# Patient Record
Sex: Male | Born: 1960 | Race: White | Marital: Married | Smoking: Never smoker
Health system: Northeastern US, Academic
[De-identification: ages and names within clinical notes are randomized; demographics above are authoritative.]

## PROBLEM LIST (undated history)

## (undated) DIAGNOSIS — E119 Type 2 diabetes mellitus without complications: Secondary | ICD-10-CM

## (undated) DIAGNOSIS — G709 Myoneural disorder, unspecified: Secondary | ICD-10-CM

## (undated) DIAGNOSIS — M1990 Unspecified osteoarthritis, unspecified site: Secondary | ICD-10-CM

## (undated) DIAGNOSIS — M170 Bilateral primary osteoarthritis of knee: Secondary | ICD-10-CM

## (undated) DIAGNOSIS — R079 Chest pain, unspecified: Secondary | ICD-10-CM

## (undated) DIAGNOSIS — F419 Anxiety disorder, unspecified: Secondary | ICD-10-CM

## (undated) HISTORY — PX: KNEE ARTHROSCOPY - LIGAMENT RECONSTRUCTION: SHX127B

## (undated) HISTORY — DX: Anxiety disorder, unspecified: F41.9

## (undated) HISTORY — PX: OTHER SURGICAL HISTORY: SHX169

## (undated) HISTORY — PX: KNEE REPLACEMENT: SHX530B

## (undated) HISTORY — DX: Chest pain, unspecified: R07.9

## (undated) HISTORY — DX: Type 2 diabetes mellitus without complications: E11.9

## (undated) HISTORY — PX: HAND SURGERY: SHX662

---

## 2011-06-09 ENCOUNTER — Encounter: Payer: Self-pay | Admitting: Primary Care

## 2011-06-09 ENCOUNTER — Ambulatory Visit: Payer: Self-pay | Admitting: Primary Care

## 2011-06-09 VITALS — BP 146/88 | HR 74 | Ht 68.5 in | Wt 202.0 lb

## 2011-06-09 DIAGNOSIS — R079 Chest pain, unspecified: Secondary | ICD-10-CM

## 2011-06-09 DIAGNOSIS — M25569 Pain in unspecified knee: Secondary | ICD-10-CM | POA: Insufficient documentation

## 2011-06-09 DIAGNOSIS — Z0000 Encounter for general adult medical examination without abnormal findings: Secondary | ICD-10-CM

## 2011-06-09 DIAGNOSIS — M25519 Pain in unspecified shoulder: Secondary | ICD-10-CM | POA: Insufficient documentation

## 2011-06-09 NOTE — H&P (Signed)
History and Physical    HISTORY:  Chief Complaint   Patient presents with    Annual Exam     new patient         History of Present Illness:    HPI  New patient, here to establish care.    Generally pretty healthy.  He has had some trouble with joint pain.  He is most bothered by pain in his knees.  The pain is behind both kneecaps.  He has a very physical job, and thinks that this may be contributing to his pain.  He's also had some trouble with neck and shoulder pain, has seen orthopedics ([facility], Dr. Neldon Labella.)  Uses ibuprofen 600 mg at a time, with minimal benefit.    He occasionally has left-sided chest pain, sometimes with exertion.  He thinks it may also come on with stress.  He is interested in having a stress test to check his heart.    He reports a great deal of stress at work.  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  He sometimes works 36 hour shifts, it is often call then if strange hours.  The work tends to be fairly physical.    He would like to lose weight.  He knows that he probably needs to get more dedicated exercise, he thinks that he's been doing a pretty good job watching his diet.  He might be interested in seeing a nutritionist     nonsmoker  Problems:  Patient Active Problem List   Diagnoses Code    Knee pain 719.46    Shoulder pain 719.41        Past Medical/Surgical History:   History reviewed. No pertinent past medical history.  History reviewed. No pertinent past surgical history.      Allergies:    Allergies   Allergen Reactions    Penicillins      rash       Current medications:    No current outpatient prescriptions on file.       Family History:    History reviewed. No pertinent family history.    Social/Occupational History:   History     Social History    Marital Status: Married     Spouse Name: N/A     Number of Children: N/A    Years of Education: N/A     Social History Main Topics    Smoking status: Not on file    Smokeless  tobacco: Not on file    Alcohol Use:     Drug Use:     Sexually Active:      Other Topics Concern    Not on file     Social History Narrative    No narrative on file         Review of Systems:    Review of Systems   Constitutional: Negative.    HENT: Positive for neck pain.    Eyes: Negative.    Respiratory: Negative.    Cardiovascular: Negative.    Gastrointestinal: Negative.    Genitourinary: Negative.    Musculoskeletal: Positive for joint pain.   Skin: Negative.    Neurological: Negative.    Endo/Heme/Allergies: Negative.    Psychiatric/Behavioral: Negative.          PHYSICAL EXAM:  Physical Exam   Constitutional: He is oriented to person, place, and time. He appears well-developed and well-nourished. No distress.   HENT:   Head: Normocephalic and atraumatic.  Right Ear: External ear normal.   Left Ear: External ear normal.   Nose: Nose normal.   Mouth/Throat: Oropharynx is clear and moist. No oropharyngeal exudate.   Eyes: EOM are normal. Pupils are equal, round, and reactive to light. Right eye exhibits no discharge. Left eye exhibits no discharge. No scleral icterus.   Neck: Normal range of motion. Neck supple. No JVD present. Carotid bruit is not present. No tracheal deviation present. No thyromegaly present.   Cardiovascular: Normal rate, regular rhythm and normal heart sounds.  Exam reveals no gallop and no friction rub.    No murmur heard.  Pulmonary/Chest: Effort normal and breath sounds normal. No stridor. No respiratory distress. He has no wheezes. He has no rales. He exhibits no tenderness.   Abdominal: Soft. Bowel sounds are normal. He exhibits no distension. There is no tenderness. There is no rebound and no guarding.   Genitourinary: Rectum normal, prostate normal and penis normal.        No inguinal hernias   Musculoskeletal: Normal range of motion. He exhibits no edema.   Lymphadenopathy:     He has no cervical adenopathy.   Neurological: He is alert and oriented to person, place, and time.  He exhibits normal muscle tone.   Skin: Skin is warm and dry. No rash noted. He is not diaphoretic. No erythema.   Psychiatric: He has a normal mood and affect. His behavior is normal. Judgment and thought content normal.       Vital Signs:   BP 146/88  Pulse 74  Ht 1.74 m (5' 8.5")  Wt 91.627 kg (202 lb)  BMI 30.27 kg/m2    Assessment:    Lance Peters was seen today for annual exam.    Diagnoses and associated orders for this visit:    Routine general medical examination at a health care facility.  Needs first screening colonoscopy, ordered  - Basic metabolic panel; Future  - CBC; Future  - Lipid panel; Future  - PSA (eff.07-2008); Future  - EKG 12 lead; Future  - AMB REFERRAL TO NUTRITION               s  - AMB REFERRAL TO GASTROENTEROLOGY    Chest pain-there is an exertional component, electrocardiogram normal.  Will refer for treadmill stress testing, for further risk stratification.  His symptoms are not terribly convincing, so would hold off on stress imaging for now pending treadmill stress test.  - EKG 12 lead; Future  - Exercise tolerance (regular); Future    Weight gain-recommend nutritionist evaluation, ordered.    Chronic knee pain-most likely do to chronic overuse, patellofemoral syndrome are similar.  Recommended physical therapy, he prefers to hold off on this for now, but will let me know if he wishes to proceed in the future.           .      Plan:

## 2011-06-20 ENCOUNTER — Telehealth: Payer: Self-pay | Admitting: Primary Care

## 2011-06-20 NOTE — Telephone Encounter (Signed)
Left message to call back  

## 2011-06-20 NOTE — Telephone Encounter (Signed)
His stress test came back normal, it is safe for him to start exercising.  However, his blood pressure was high before and during the test, so it recommend against heavy weight lifting or other high intensity exercise for now.    With exercise and maybe some weight loss, his blood pressure may well fix itself.  If it doesn't get lower in the next month or so, we should talk about starting medicine to lower the blood pressure, to reduce his risk of heart attack/stroke et Karie Soda.    Please have him come back in for blood pressure check in about a month, send a message to make sure that he shows up.    Also, I recommend he read the book"Younger Next Year" by Malva Cogan for good information on exercise and diet

## 2011-06-24 NOTE — Telephone Encounter (Signed)
Spoke to the patient and gave him the results of the stress echo, I also explained the high blood pressure and possible medication requirements if doesn't lower.

## 2011-07-29 ENCOUNTER — Telehealth: Payer: Self-pay | Admitting: Primary Care

## 2011-07-29 NOTE — Telephone Encounter (Signed)
Phone call to patient to remind him he is overdue for his colonoscopy.  Left message to call.

## 2011-08-21 NOTE — Telephone Encounter (Signed)
Second Phone call to patient to remind him he is overdue for his colonoscopy.  Left message to call.

## 2011-09-04 NOTE — Telephone Encounter (Signed)
I have placed two calls & sent a letter to the patient with no response regarding an overdue colonoscopy.  Please let your staff know if you wish to continue to follow up on this issue.

## 2011-09-09 ENCOUNTER — Telehealth: Payer: Self-pay | Admitting: Primary Care

## 2011-09-09 NOTE — Telephone Encounter (Signed)
Phone call to patient to remind him he is overdue for his colonoscopy.  Left message to call.

## 2012-04-29 ENCOUNTER — Telehealth: Payer: Self-pay | Admitting: Primary Care

## 2012-04-29 DIAGNOSIS — Z1211 Encounter for screening for malignant neoplasm of colon: Secondary | ICD-10-CM

## 2012-04-29 NOTE — Telephone Encounter (Signed)
Please send a cancer screening colonoscopy ref

## 2012-06-25 NOTE — Telephone Encounter (Signed)
Left second message for pt to call back.  Pt overdue for colonoscopy

## 2012-07-02 NOTE — Telephone Encounter (Signed)
Pt states he will call and schedule his colonoscopy, phone number given to pt for [facility]

## 2012-08-04 ENCOUNTER — Encounter: Payer: Self-pay | Admitting: Gastroenterology

## 2012-08-18 ENCOUNTER — Encounter: Payer: Self-pay | Admitting: Gastroenterology

## 2012-08-23 ENCOUNTER — Telehealth: Payer: Self-pay | Admitting: Gastroenterology

## 2012-08-23 NOTE — Telephone Encounter (Signed)
Colonoscopy scheduled for 09/03/2012 @ 2:15 PM with Dr Venia Carbon at [facility] Dr. Lanney Gins Prep instructions with date, time and location. Also, per request mailed NPP.

## 2012-08-23 NOTE — Telephone Encounter (Signed)
Please mail out NPV paperwork and colonoscopy prep instructions to the patient's home address.

## 2012-08-27 ENCOUNTER — Telehealth: Payer: Self-pay | Admitting: Primary Care

## 2012-08-27 ENCOUNTER — Ambulatory Visit
Admit: 2012-08-27 | Discharge: 2012-08-27 | Disposition: A | Discharge status: Home / Self Care | Payer: Self-pay | Source: Ambulatory Visit | Attending: Primary Care | Admitting: Primary Care

## 2012-08-27 DIAGNOSIS — Z0000 Encounter for general adult medical examination without abnormal findings: Secondary | ICD-10-CM

## 2012-08-27 LAB — BASIC METABOLIC PANEL
Anion Gap: 11 (ref 7–16)
CO2: 25 mmol/L (ref 20–28)
Calcium: 9.2 mg/dL (ref 8.6–10.2)
Chloride: 106 mmol/L (ref 96–108)
Creatinine: 1.25 mg/dL — ABNORMAL HIGH (ref 0.67–1.17)
GFR,Black: 75 *
GFR,Caucasian: 65 *
Glucose: 111 mg/dL — ABNORMAL HIGH (ref 60–99)
Lab: 20 mg/dL (ref 6–20)
Potassium: 4.2 mmol/L (ref 3.3–5.1)
Sodium: 142 mmol/L (ref 133–145)

## 2012-08-27 LAB — LIPID PANEL
Chol/HDL Ratio: 4.9
Cholesterol: 234 mg/dL — AB
HDL: 48 mg/dL
LDL Calculated: 157 mg/dL
Non HDL Cholesterol: 186 mg/dL
Triglycerides: 146 mg/dL

## 2012-08-27 LAB — PSA (EFF.4-2010): PSA (eff. 4-2010): 1.3 ng/mL (ref 0.00–4.00)

## 2012-08-27 LAB — CBC
Hematocrit: 48 % (ref 40–51)
Hemoglobin: 16.2 g/dL (ref 13.7–17.5)
MCV: 82 fL (ref 79–92)
Platelets: 171 10*3/uL (ref 150–330)
RBC: 5.9 MIL/uL (ref 4.6–6.1)
RDW: 13.6 % (ref 11.6–14.4)
WBC: 8.4 10*3/uL (ref 4.2–9.1)

## 2012-08-27 NOTE — Telephone Encounter (Signed)
Pt requesting labs be reordered for his routine blood work. He went to the lab to have blood drawn and they were expired. Please advise

## 2012-09-01 ENCOUNTER — Telehealth: Payer: Self-pay | Admitting: Primary Care

## 2012-09-01 ENCOUNTER — Encounter: Payer: Self-pay | Admitting: Gastroenterology

## 2012-09-01 NOTE — Telephone Encounter (Signed)
Attempted to contact patient, unable to leave message.

## 2012-09-01 NOTE — Telephone Encounter (Signed)
Should see him to go over blood work. Cholesterol borderline, blood sugar slightly high also. Need to talk about how to approach these

## 2012-09-02 ENCOUNTER — Telehealth: Payer: Self-pay

## 2012-09-02 NOTE — Telephone Encounter (Signed)
Left messages on both numbers offering the patient an am slot on 09/03/2012.

## 2012-09-03 ENCOUNTER — Encounter: Payer: Self-pay | Admitting: Gastroenterology

## 2012-09-03 ENCOUNTER — Other Ambulatory Visit: Payer: Self-pay | Admitting: Gastroenterology

## 2012-09-03 ENCOUNTER — Ambulatory Visit
Admit: 2012-09-03 | Discharge: 2012-09-03 | Disposition: A | Discharge status: Home / Self Care | Payer: Self-pay | Attending: Gastroenterology | Admitting: Gastroenterology

## 2012-09-03 HISTORY — DX: Myoneural disorder, unspecified: G70.9

## 2012-09-03 MED ORDER — FENTANYL CITRATE 50 MCG/ML IJ SOLN *WRAPPED*
INTRAMUSCULAR | Status: AC | PRN
Start: 2012-09-03 — End: 2012-09-03
  Administered 2012-09-03: 25 ug via INTRAVENOUS
  Administered 2012-09-03: 12:00:00 50 ug via INTRAVENOUS

## 2012-09-03 MED ORDER — MIDAZOLAM HCL 5 MG/5ML IJ SOLN *I*
INTRAMUSCULAR | Status: AC
Start: 2012-09-03 — End: 2012-09-03
  Filled 2012-09-03: qty 15

## 2012-09-03 MED ORDER — DIPHENHYDRAMINE HCL 50 MG/ML IJ SOLN *I*
INTRAMUSCULAR | Status: AC
Start: 2012-09-03 — End: 2012-09-03
  Filled 2012-09-03: qty 1

## 2012-09-03 MED ORDER — FENTANYL CITRATE 50 MCG/ML IJ SOLN *WRAPPED*
INTRAMUSCULAR | Status: AC
Start: 2012-09-03 — End: 2012-09-03
  Filled 2012-09-03: qty 4

## 2012-09-03 MED ORDER — SODIUM CHLORIDE 0.9 % IV SOLN WRAPPED *I*
100.0000 mL/h | Status: DC
Start: 2012-09-03 — End: 2012-09-04
  Administered 2012-09-03: 100 mL/h via INTRAVENOUS

## 2012-09-03 MED ORDER — MIDAZOLAM HCL 1 MG/ML IJ SOLN *I* WRAPPED
INTRAMUSCULAR | Status: AC | PRN
Start: 2012-09-03 — End: 2012-09-03
  Administered 2012-09-03: 1 mg via INTRAVENOUS
  Administered 2012-09-03: 2 mg via INTRAVENOUS

## 2012-09-03 NOTE — Preop H&P (Signed)
OUTPATIENT PRE-PROCEDURE H&P    Chief Complaint / Indications for Procedure:   Colonoscopy- Average risk. No prior polyps. No weight loss. No overt GI bleeding.          Past Medical History:   Past Medical History   Diagnosis Date   . Neuromuscular disorder      elbows, shoulders     History reviewed. No pertinent past surgical history.  Allergies:    Allergies   Allergen Reactions   . Penicillins      rash     Medications:  No current outpatient prescriptions on file.     Current Facility-Administered Medications   Medication Dose Route Frequency   . sodium chloride 0.9 % IV  100 mL/hr Intravenous Continuous      ROS:  GI:See HPI.  FamHx/SocialHx:  Family History   Problem Relation Age of Onset   . Colon polyps Mother      History     Social History   . Marital Status: Married     Spouse Name: N/A     Number of Children: N/A   . Years of Education: N/A     Social History Main Topics   . Smoking status: Never Smoker    . Smokeless tobacco: None   . Alcohol Use: 0.6 oz/week     1 Cans of beer per week   . Drug Use: No   . Sexually Active: None     Other Topics Concern   . None     Social History Narrative   . None     Physical Examination:  Filed Vitals:    09/03/12 1038   BP: 169/110   Pulse: 69   Temp: 36.9 C (98.4 F)   Resp: 16   Height: 177.8 cm (5\' 10" )   Weight: 90.719 kg (200 lb)     Head/Nose/Throat:negative  Lungs:Lungs clear  Cardiovascular:normal S1 and S2  Abdomen: abdomen soft, non-tender, nondistended, normal active bowel sounds, no masses or organomegaly  Lab Results: none  Radiology impressions (last 30 days):  No results found.  UPDATES TO PATIENT'S CONDITION on the DAY OF SURGERY/PROCEDURE  I. Updates to Patient's Condition (to be completed by a provider privileged to complete a H&P, following reassessment of the patient by the provider):  Full H&P done today; no updates needed.  II. Procedure Readiness   I have reviewed the patient's H&P and updated condition. By completing and signing this  form, I attest that this patient is ready for surgery/procedure.  III. Attestation   I have reviewed the updated information regarding the patient's condition and it is appropriate to proceed with the planned surgery/procedure.    Melrose Nakayama Kasidy Gianino, MD as of 11:45 AM 09/03/2012

## 2012-09-03 NOTE — Telephone Encounter (Signed)
Reviewed the following note with pt:   -Should see him to go over blood work. Cholesterol borderline, blood sugar slightly high also. Need to talk about how to approach these    -- appt sched for 09/15/12

## 2012-09-03 NOTE — Procedures (Addendum)
Colonoscopy Procedure Note   Date of Procedure: 09/03/2012   Referring Physician: Guadalupe Maple, MD  Primary Physician: Guadalupe Maple, MD        Attending Physician: Elza Rafter, MD  Fellow:   None  Indications:  Colorectal cancer screening-average risk  Previous colonoscopy: Unknown  Medications: Fentanyl 75 mcg IV and Midazolam 3 mg IV   Procedure Details: Full disclosures of risks were reviewed with patient as detailed on the consent form. The patient was placed in the left lateral decubitus position and monitored with continuous pulse oximetry, interval blood pressure monitoring and direct observations.   After anorectal examination was performed, the colonoscope was inserted into the rectum and advanced under direct vision to the cecum, which was identified by  the ileocecal valve and the appendiceal orifice. The procedure was considered not difficult.  During withdrawal examination, the final quality of the prep was [facility] Bowel Prep Scale:  Right Colon: Grade3- (entire mucosa of colon segment seen well, with no residual staining, small fragments of stool, or opaque liquid)  Transverse Colon: Grade 3- (entire mucosa of colon segment seen well, with no residual staining, small fragments of stool, or opaque liquid)  Left Colon: Grade 3- (entire mucosa of colon segment seen well, with no residual staining, small fragments of stool, or opaque liquid)  A careful inspection was made as the colonoscope was withdrawn. A retroflexed view of the rectum was performed; findings and interventions are described below. The patient was recovered in the GI recovery area.   Scopes: PCF-H180AL  Scope Times:     Insertion Time: 1151  Cecum Time: 1154  Exit Time: 1217    Findings:  Anorectal:   Normal tone, no masses  Terminal Ileum:   Normal ileal mucosa  Colon:  Single 1 cm pedunculated polyp in the sigmoid at approximately 35 cm from the anal verge; removed with hot snare at the stalk. Opposite wall was marked in  the event that the resection margin is not clear.   Single 2 mm sessile polyp in the transverse colon was resected and retrieved with jumbo forceps  Five 1 mm sessile polyps in the rectum were resected and retrieved with jumbo forceps  Small internal hemorrhoids on retroflexion  Complication(s):    none    Impression(s):   Multiple polyps as removed above.    At least one was a pedunculated advanced adenoma    Recommendation(s):    Await pathology.    Findings and recommendations discussed with patient and accompanying responsible adult prior to discharge    Army Chaco, MD    Path Addendum:  ---------------------  FINAL DIAGNOSIS:     ADDENDUM 1     B. The inked and cauterized stalk resection margin appears to be free  of adenomatous epithelium.    ORIGINAL REPORT DATED 09/06/12     (A) Colon, transverse, biopsy:  - Hyperplastic polyp.    (B) Sigmoid colon, biopsy:  - Tubular adenoma and 1 fragment of hyperplastic polyp.    (C) Rectosigmoid colon, biopsy:  - Sessile serrated adenoma/polyp.      ---------------------  - Reviewed with pathologist- cauterized margin clear of adenomatous change (ie. Polyp was completely resected)  - Single small rectal SSA and proximal hyperplastic polyp(possible SSA pathway)  ------------------  - repeat colonoscopy within 3 years

## 2012-09-03 NOTE — Discharge Instructions (Signed)
Gastroenterology Unit  Discharge Instructions for Colonoscopy    09/03/2012  12:25 PM    Colonoscopy    Do not drive, operate heavy machinery, drink alcoholic beverages, make important personal or business decisions, or sign legal documents until the next day.    Return to your usual diet   Return to taking your usual medications  Things you may expect:   A small amount of bright red blood in your stool   It may be a few days before you have a bowel movement   You may have cramping, bloating, and feelings of "gas". These feelings should go away as you pass gas. If you still feel uncomfortable, walking around will help to pass the gas.   You were given medication to help you relax during the test. You may feel "fuzzy" and drowsy. Go home and rest for at least 4-6 hours.  You should call your doctor for any of the following:   Bad stomach pain   Fever   Bright red bleeding or clots (This may happen up to 20 days after the test.)   Dizziness or weakness that gets worse or lasts up to 24 hours.   Pain or redness at the IV site    If you have a serious problem after hours, Call [phone] to reach the GI physician on call. If you are unable to reach your doctor, go to the [facility] Emergency Department.    Follow Up Care:   If biopsies were taken during your procedure, we will send you the pathology results within 7-10 days. If you do not receive your pathology results after 10 days please call [phone]   Report will be sent to your primary doctor.   Call [phone] for a follow up appointment with Elza Rafter, MD in 8 weeks if clinic follow up desired.     New Prescriptions    No medications on file

## 2012-09-15 ENCOUNTER — Ambulatory Visit: Payer: Self-pay | Admitting: Primary Care

## 2012-09-15 ENCOUNTER — Encounter: Payer: Self-pay | Admitting: Primary Care

## 2012-09-15 VITALS — BP 134/84 | HR 60 | Ht 70.0 in | Wt 205.6 lb

## 2012-09-15 DIAGNOSIS — R079 Chest pain, unspecified: Secondary | ICD-10-CM | POA: Insufficient documentation

## 2012-09-15 DIAGNOSIS — Z1159 Encounter for screening for other viral diseases: Secondary | ICD-10-CM

## 2012-09-15 DIAGNOSIS — R7301 Impaired fasting glucose: Secondary | ICD-10-CM

## 2012-09-15 LAB — SURGICAL PATHOLOGY

## 2012-09-15 LAB — HM COLONOSCOPY

## 2012-09-15 NOTE — Patient Instructions (Addendum)
Try to exercise 30 min a day, 4-5 days a week (bike, walk, etc)    Repeat blood work in September - fasting, but ok to drink water

## 2012-09-15 NOTE — Progress Notes (Signed)
Quick Note:    -reviewed,  -patient/pcp informed via letter/electronic medical record.      ______

## 2012-09-15 NOTE — Progress Notes (Signed)
Lance Peters is a 52 y.o. male   09/15/2012    Chief Complaint   Patient presents with   . Results     review blood work   . Other     "bulge" in RUQ       HPI:  Here for followup of recent laboratory testing.  It is a metabolic panel, CBC, lipid panel, PSA all done.  Reviewed results with him.  Results are significant for impaired fasting glucose, slightly elevated serum creatinine level, and borderline cholesterol.  We don't have any old blood work for comparison purposes.    He reports that he is still having some left-sided chest pain that occurs with exertion.  We discussed this at his first appointment, he did a treadmill exercise tolerance test, which was negative for ischemia.  However, he continues to be concerned about the possibility of heart disease.  The chest pain gets better when he rests.  He says it has been happening with increasing frequency lately.    He's noticed a bulge in the left side of his abdomen.  Prominent when he stands up.  Nontender.  He wonders whether it might be a bulging blood vessel.    He works as a Product/process development scientist for [employer]. Sometimes has a crazy schedule.  Worked a full day yesterday, and was called back at 5:30 PM, and worked until 9:00 this morning.  When he works irregular hours, this frequently results in poor dietary practices.  He has a fairly physical job, and has not been on a regular exercise program outside of work.      Nonsmoker  Patient Active Problem List   Diagnosis Code   . Knee pain 719.46   . Shoulder pain 719.41       Allergies: Penicillins    Medication list reviewed, changes made  Allergy list reviewed  Problem list reviewed  Medical, family, surgical, social history is all reviewed and updated as necessary    OBJECTIVE:  Filed Vitals:    09/15/12 1531   BP: 134/84   Pulse:    Height:    Weight:      GENERAL:  Alert, no distress  HEENT: No ocular discharge.     NECK:  Trachea midline, thyroid gland normal, without masses; no significant adenopathy  LUNGS:   Normal respiratory effort, Clear to auscultation bilaterally, without wheezes or crackles  CV: Regular rate and rhythm, S1 and S2 are normal, without murmurs rubs or gallops.  Carotid pulses are normal, without bruit  Abdomen: Obese.  On the left abdominal wall, just lateral of the rectus muscle group, and proximal to the umbilicus, there is a small firm mass, about a centimeter or so in diameter.  Nontender.  Mobile.  Nonreducing will.    ASSESSMENT/PLAN:  Impaired fasting glucose-we discussed the potential for progression to diabetes.  Recommended daily organized exercise such as walking or bicycling.  Try to lose 10-20 pounds.  We discussed dietary modifications.  I offered him a referral to nutritionist or to our care manager, which he declined at this time.  We'll recheck blood sugar in about 3 months, and further recommendations to follow.    Blood pressure elevated when he first came in, better on recheck.  Had a hypertensive response on the treadmill.  I am referring him to cardiology because of the chest pain, might consider 24 and that her blood pressure monitor.    Left-sided chest pain-some of his symptoms are concerning for coronary disease.  Differential diagnosis fluids musculoskeletal, which I think is the most likely.  Pulmonary embolism is not likely, given the sporadic nature of the pain and lack of breathlessness and so forth.  He is not at particularly high risk for blood clots.  I have referred him to cardiology for consultation, to help [place] decide whether further provocative testing and cardiac imaging is warranted.    Borderline cholesterol-again, recommended diet, exercise, weight loss.  Recheck in 3 months.    Abdominal mass-suspect lipoma or other benign etiology.  Does not appear to be a hernia.  Doubt cancerous lesion.  I've asked him to monitor the area, let me know if it grows or becomes more painful.    Health care maintenance-PSA low.  We'll check a hepatitis C antibody according to  CDC recommendations    This note was dictated  using Conservation officer, historic buildings. Reasonable attempts to proofread were made, but transcription errors may occur.

## 2012-09-17 ENCOUNTER — Encounter: Payer: Self-pay | Admitting: Primary Care

## 2012-09-22 ENCOUNTER — Ambulatory Visit
Admit: 2012-09-22 | Discharge: 2012-09-22 | Disposition: A | Discharge status: Home / Self Care | Payer: Self-pay | Source: Ambulatory Visit | Attending: Cardiology | Admitting: Cardiology

## 2012-09-22 ENCOUNTER — Encounter: Payer: Self-pay | Admitting: Cardiology

## 2012-09-22 ENCOUNTER — Ambulatory Visit: Payer: Self-pay | Admitting: Cardiology

## 2012-09-22 ENCOUNTER — Other Ambulatory Visit: Payer: Self-pay | Admitting: Gastroenterology

## 2012-09-22 VITALS — BP 128/88 | HR 56 | Ht 70.0 in | Wt 202.0 lb

## 2012-09-22 DIAGNOSIS — R079 Chest pain, unspecified: Secondary | ICD-10-CM

## 2012-09-22 NOTE — Progress Notes (Signed)
Reason for visit: Chest pain     History of Present Illness:  We had the pleasure of seeing Lance Peters today in our [facility].  He is a 52 y.o. with good general health that was referred for assessment of his chest pain.  Lance Peters has been experiencing chest discomfort for 2-3 years.  This is primarily left-sided but does inconsistently radiate across his sternum.  It is characterized as a "sharp" discomfort that persists for 5-10 minutes in duration.  It is associated with shortness of breath and lightheadedness.  It occurs exclusively with activity such as climbing hills and prolonged walking.  He has not tried any pharmacologic agents to alleviate discomfort, but they do resolve with rest.  This prompted an exercise tolerance test last year that was reportedly normal.  However, since that time episodes have increased in frequency and intensity.      Past Medical History   Diagnosis Date   . Neuromuscular disorder      elbows, shoulders   . Chest pain, unspecified      History reviewed. No pertinent past surgical history.      No current outpatient prescriptions on file.       Allergies   Allergen Reactions   . Penicillins      rash       Family History   Problem Relation Age of Onset   . Colon polyps Mother    . Cancer Mother        History     Social History   . Marital Status: Married     Spouse Name: N/A     Number of Children: N/A   . Years of Education: N/A     Social History Main Topics   . Smoking status: Never Smoker    . Smokeless tobacco: None   . Alcohol Use: 0.6 oz/week     1 Cans of beer per week   . Drug Use: No   . Sexually Active: None     Other Topics Concern   . None     Social History Narrative   . None         Review of Systems:    General: No significant weight change, fatigue, fevers  Skin: No new lesions  Eyes: Glasses   ENT: No nasal symptoms, hearing loss, sore throat  Cardiovascular: HPI  Pulmonary: HPI  GI: No nausea, vomiting,  or change in bowel habits  GU: No dysuria or polyuria  Heme: No bleeding/bruisability   Endo: Borderline DM, thyroid normal  Musculoskeletal: Leg pains  Neuro: + headaches and lightheadedness      PHYSICAL EXAM:  BP 128/88  Pulse 56  Ht 1.778 m (5\' 10" )  Wt 91.627 kg (202 lb)  BMI 28.98 kg/m2  General: alert, full, NAD  HEENT: anicteric, MMM, no E/E OP, conj pink, no arcus senilis   Neck: no JVD, bruits, or LAD  CV: RRR, ns1/s2, no murmurs, rubs, or gallops  Pulm: CTA B  Abd: soft, NT, ND, +BS  Ext: no edema or cyanosis, 2+ distal pulses  Neuro: no gross focal deficits  Skin: no visible lesions      EKG (09/22/12): NSR at 56 BPM, normal axis/intervals, no AE, no VH, no pathologic QW or ST deviation    Other Cardiac Studies:  None recent    Assessment & Plan:   Lance Peters is a 52 y.o. with good general health that was referred for assessment of his chest pain.  Chest pains: Symptoms have some concerning features (location, triggers, associated features, duration), but he is at relatively low risk based on medical history (10-year ACC/AHA risk of vascular event is only 6%).  I believe a stress test is needed to rule out significant obstructive disease.  A stress echocardiogram would also evaluate occult valvular etiologies and provide an estimate of pulmonary artery pressures.  He will start on low dose aspirin and avoid strenuous activity until completed.  We reviewed signs and symptoms of progressing disease and he will go immediately to the emergency department if symptoms do not resolve with rest.   - Labs reviewed   - ECG without ischemia, infarction, or arrhythmia    - Stress echocardiogram in 1 week    - Start low dose aspirin   - Avoid all strenuous activity until further notice        Theressa Stamps, MD  Cardiovascular Disease    Suggest follow-up in one week.  Thank you for allowing [place] to participate in this patient's care.  Please call or e-mail ([email or web address] with any questions  or concerns regarding his care.

## 2012-09-23 ENCOUNTER — Other Ambulatory Visit: Payer: Self-pay

## 2012-09-23 DIAGNOSIS — R079 Chest pain, unspecified: Secondary | ICD-10-CM

## 2012-09-27 ENCOUNTER — Ambulatory Visit: Payer: Self-pay | Admitting: Cardiology

## 2012-09-27 ENCOUNTER — Ambulatory Visit
Admit: 2012-09-27 | Discharge: 2012-09-27 | Disposition: A | Discharge status: Home / Self Care | Payer: Self-pay | Source: Ambulatory Visit | Attending: Cardiology | Admitting: Cardiology

## 2012-09-27 ENCOUNTER — Encounter: Payer: Self-pay | Admitting: Cardiology

## 2012-09-27 ENCOUNTER — Ambulatory Visit: Payer: Self-pay

## 2012-09-27 VITALS — BP 132/90 | HR 58 | Ht 70.0 in | Wt 202.0 lb

## 2012-09-27 DIAGNOSIS — R079 Chest pain, unspecified: Secondary | ICD-10-CM

## 2012-09-27 LAB — EKG 12-LEAD
P: 7 degrees
QRS: 36 degrees
Rate: 56 {beats}/min
Severity: NORMAL
Severity: NORMAL
T: 41 degrees

## 2012-09-27 NOTE — Progress Notes (Signed)
Reason for visit: Chest pain and stress echocardiography    History of Present Illness:  We had the pleasure of seeing Lance Peters today in our [facility].  He is a 52 y.o. with good general health that was referred for assessment of his chest pain last week.  Lance Peters has been experiencing chest discomfort for 2-3 years that was concerning for potential angina.  It is primarily left-sided, "sharp",  persists for 5-10 minutes in duration, and only occurs with activity.  Aspirin was added, but no other changes were made.        Past Medical History   Diagnosis Date   . Neuromuscular disorder      elbows, shoulders   . Chest pain, unspecified      No past surgical history on file.      Current Outpatient Prescriptions   Medication Sig   . aspirin 81 MG tablet Take 81 mg by mouth daily       Allergies   Allergen Reactions   . Penicillins      rash       Family History   Problem Relation Age of Onset   . Colon polyps Mother    . Cancer Mother        History     Social History   . Marital Status: Married     Spouse Name: N/A     Number of Children: N/A   . Years of Education: N/A     Social History Main Topics   . Smoking status: Never Smoker    . Smokeless tobacco: None   . Alcohol Use: 0.6 oz/week     1 Cans of beer per week   . Drug Use: No   . Sexually Active: None     Other Topics Concern   . None     Social History Narrative   . None         Review of Systems:   Limited ROS completed with pertinent information included in the HPI      PHYSICAL EXAM:  BP 132/90  Pulse 58  Ht 1.778 m (5\' 10" )  Wt 91.627 kg (202 lb)  BMI 28.98 kg/m2  General: alert, full, NAD  HEENT: anicteric, MMM, no E/E OP, conj pink, no arcus senilis   Neck: no JVD, bruits, or LAD  CV: RRR, ns1/s2, no murmurs, rubs, or gallops  Pulm: CTA B  Abd: soft, NT, ND, +BS  Ext: no edema or cyanosis, 2+ distal pulses  Neuro: no gross focal deficits  Skin: no visible lesions      EKG (09/22/12): NSR  at 56 BPM, normal axis/intervals, no AE, no VH, no pathologic QW or ST deviation    Exercise echocardiogram (09/27/12):    1.  Mild left ventricular hypertrophy with normal systolic function.  2.  No significant valvular abnormalities.    3.  Left atrial enlargement.  4.  No evidence of stress-induced ECG changes or inducible arrhythmias.    5.  Good exercise tolerance with good augmentation of LV function and no evidence of stress-induced regional wall motion abnormalities.    6.  Reproduction of symptoms, but no objective evidence of inducible ischemia at an acceptable cardiac workload.    Assessment & Plan:   Lance Peters is a 52 y.o. with good general health that was referred for assessment of his chest pain.       Chest pains: Fares showed very good exercise tolerance today and did have  recurrence of his chest discomfort.  However, there was no evidence of ischemia on EKG or echo images.  Symptoms are consistent with angina, but may be reflective of microvascular disease, hypertensive response, or small vessel ischemia.  Given the benign study and low ACC/AHA 10-year risk (6%), will try treating non-cardiac causes.  If symptoms persist, will consider angiography.      - Stress echocardiogram as above    - Start more regular aerobic exercise    - Continue low dose aspirin   - Lipids with LDL slightly less than 160 mg/dL        Theressa Stamps, MD  Cardiovascular Disease    Suggest follow-up in one month.  Thank you for allowing [place] to participate in this patient's care.  Please call or e-mail ([email or web address] with any questions or concerns regarding his care.

## 2012-10-27 ENCOUNTER — Encounter: Payer: Self-pay | Admitting: Gastroenterology

## 2012-11-03 ENCOUNTER — Ambulatory Visit: Payer: Self-pay | Admitting: Cardiology

## 2012-11-10 ENCOUNTER — Encounter: Payer: Self-pay | Admitting: Gastroenterology

## 2012-12-14 ENCOUNTER — Encounter: Payer: Self-pay | Admitting: Gastroenterology

## 2013-01-07 ENCOUNTER — Encounter: Payer: Self-pay | Admitting: Gastroenterology

## 2013-06-02 ENCOUNTER — Encounter: Payer: Self-pay | Admitting: Primary Care

## 2013-06-02 ENCOUNTER — Ambulatory Visit: Payer: Self-pay | Admitting: Primary Care

## 2013-06-02 VITALS — BP 160/82 | HR 70 | Ht 70.0 in | Wt 216.0 lb

## 2013-06-02 DIAGNOSIS — I10 Essential (primary) hypertension: Secondary | ICD-10-CM

## 2013-06-02 HISTORY — DX: Essential (primary) hypertension: I10

## 2013-06-02 MED ORDER — AMLODIPINE BESYLATE 5 MG PO TABS *I*
5.0000 mg | ORAL_TABLET | Freq: Every day | ORAL | Status: DC
Start: 2013-06-02 — End: 2013-06-17

## 2013-06-02 NOTE — Progress Notes (Signed)
Lance Peters is a 53 y.o. male   06/02/2013    Chief Complaint   Patient presents with    Hypertension     discuss treatment       HPI:  Here for hypertension.  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  He was redirected here, so that we can address his blood pressure.    His blood pressure has been high, on and off, over the years.  Last 2 times she been here was normal.    He had some trouble with chest pain last summer, underwent cardiac stress testing was found to be negative for ischemic heart disease.    No chest pain now.  No excessive breathlessness.    He has put on about 10 pounds for the last several months, recently join the [facility] with the intention of weight loss.    Has never been treated for hypertension    There are some laboratory tests ordered, he has not yet done.    Nonsmoker  Patient Active Problem List   Diagnosis Code    Knee pain 719.46    Shoulder pain 719.41    Impaired fasting glucose 790.21    Chest pain 786.50       Allergies: Penicillins    Medication list reviewed, no changes made  Allergy list reviewed  Problem list reviewed    OBJECTIVE:  Filed Vitals:    06/02/13 1532   BP: 160/82   Pulse: 70   Height: 1.778 m (5\' 10" )   Weight: 97.977 kg (216 lb)   GENERAL:  Alert, no distress  HEENT: No ocular discharge.     NECK:  Trachea midline, thyroid gland normal, without masses; no significant adenopathy  LUNGS:  Normal respiratory effort, Clear to auscultation bilaterally, without wheezes or crackles  CV: Regular rate and rhythm, S1 and S2 are normal, without murmurs rubs or gallops.  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  Start amlodipine 5 mg daily.  Watch for headache or swollen  feet.  Return for blood pressure check in 2-3 weeks, with further followup to be determined based on those results.  I've also asked him to go ahead and repeat the blood work that we had previously discussed, orders are already in the chart.    This note was dictated  using Systems analyst. Reasonable attempts to proofread were made, but transcription errors may occur.

## 2013-06-02 NOTE — Patient Instructions (Signed)
Return for BP check - no appointment needed - in 2-3 weeks

## 2013-06-16 ENCOUNTER — Ambulatory Visit: Payer: Self-pay

## 2013-06-16 ENCOUNTER — Telehealth: Payer: Self-pay | Admitting: Primary Care

## 2013-06-16 NOTE — Telephone Encounter (Signed)
Patient came in to the office for BP check.    BP  152/90  Pulse  74  O2 sat  98%    Lance Peters has been checking his BP at [facility], readings are as follows -  2/26 - 158/98  3/1   - 150/99  3/5   - 171/108, 167/108  3/6   - 169/105, 153/97  3/8   -  160/99    Lance Hipple would like to know if he should make an office appointment to address BP or should his medications be changed/modified. Please advise.

## 2013-06-17 MED ORDER — AMLODIPINE BESYLATE 10 MG PO TABS *I*
10.0000 mg | ORAL_TABLET | Freq: Every day | ORAL | Status: DC
Start: 2013-06-17 — End: 2013-12-29

## 2013-06-17 NOTE — Telephone Encounter (Signed)
ADVISED PT:   -     Commend increasing amlodipine to 10 mg daily. Watch for headache or swollen feet. Okay to use 2 of the 5 mg tablets until gone. Prescription for 10 mg tablets sent to the pharmacy

## 2013-06-17 NOTE — Telephone Encounter (Signed)
Commend increasing amlodipine to 10 mg daily.  Watch for headache or swollen feet.  Okay to use 2 of the 5 mg tablets until gone.  Prescription for 10 mg tablets sent to the pharmacy

## 2013-06-17 NOTE — Telephone Encounter (Signed)
Left message for pt to call the office.

## 2013-07-14 MED ORDER — BENAZEPRIL HCL 10 MG PO TABS *I*
10.0000 mg | ORAL_TABLET | Freq: Every day | ORAL | Status: DC
Start: 2013-07-14 — End: 2013-08-01

## 2013-07-14 NOTE — Addendum Note (Signed)
Addended by: Penni Bombard on: 07/14/2013 11:38 AM     Modules accepted: Orders

## 2013-07-14 NOTE — Telephone Encounter (Signed)
Pt has been taking 10 mg of Amlodipine for 3 weeks and does not feel it is helping, please advise    Bp now is 153/98 , pt states it has been as high as 173/107 earlier this week

## 2013-07-14 NOTE — Telephone Encounter (Signed)
Continue amlodipine, add benazepril 10 mf a day, bp check 7-10 days

## 2013-07-14 NOTE — Telephone Encounter (Signed)
Advised pt:   -     Continue amlodipine, add benazepril 10 mf a day, bp check 7-10 days

## 2013-08-01 ENCOUNTER — Encounter: Payer: Self-pay | Admitting: Primary Care

## 2013-08-01 ENCOUNTER — Ambulatory Visit: Payer: Self-pay | Admitting: Primary Care

## 2013-08-01 VITALS — BP 160/90 | HR 80 | Ht 70.0 in | Wt 215.8 lb

## 2013-08-01 DIAGNOSIS — R7301 Impaired fasting glucose: Secondary | ICD-10-CM

## 2013-08-01 DIAGNOSIS — I10 Essential (primary) hypertension: Secondary | ICD-10-CM

## 2013-08-01 LAB — BASIC METABOLIC PANEL
Anion Gap: 13 (ref 7–16)
CO2: 25 mmol/L (ref 20–28)
Calcium: 8.7 mg/dL (ref 8.6–10.2)
Chloride: 101 mmol/L (ref 96–108)
Creatinine: 1.1 mg/dL (ref 0.67–1.17)
GFR,Black: 87 *
GFR,Caucasian: 76 *
Glucose: 85 mg/dL (ref 60–99)
Lab: 19 mg/dL (ref 6–20)
Potassium: 4.5 mmol/L (ref 3.3–5.1)
Sodium: 139 mmol/L (ref 133–145)

## 2013-08-01 LAB — LIPID PANEL
Chol/HDL Ratio: 7.2
Cholesterol: 238 mg/dL — AB
HDL: 33 mg/dL
LDL Calculated: 128 mg/dL
Non HDL Cholesterol: 205 mg/dL
Triglycerides: 383 mg/dL — AB

## 2013-08-01 LAB — URINALYSIS WITH REFLEX TO MICROSCOPIC
Blood,UA: NEGATIVE
Ketones, UA: NEGATIVE
Leuk Esterase,UA: NEGATIVE
Nitrite,UA: NEGATIVE
Protein,UA: NEGATIVE mg/dL
Specific Gravity,UA: 1.021 (ref 1.002–1.030)
pH,UA: 5 (ref 5.0–8.0)

## 2013-08-01 LAB — TSH: TSH: 2.65 u[IU]/mL (ref 0.27–4.20)

## 2013-08-01 MED ORDER — BENAZEPRIL HCL 20 MG PO TABS *I*
20.0000 mg | ORAL_TABLET | Freq: Every day | ORAL | Status: AC
Start: 2013-08-01 — End: 2014-01-28

## 2013-08-01 MED ORDER — BLOOD PRESSURE MONITOR DEVICE *A*
Status: DC
Start: 2013-08-01 — End: 2014-10-03

## 2013-08-02 ENCOUNTER — Telehealth: Payer: Self-pay | Admitting: Primary Care

## 2013-08-02 LAB — HEMOGLOBIN A1C: Hemoglobin A1C: 6.8 % — ABNORMAL HIGH (ref 4.0–6.0)

## 2013-08-02 NOTE — Progress Notes (Signed)
Lance Peters is a 53 y.o. male   08/01/2013    Chief Complaint   Patient presents with    Hypertension       HPI:  Followup hypertension.  He is taking his amlodipine and benazepril as prescribed, blood pressure still running high here in the office.  He checks the [facility], where it is a little bit better.  Yesterday was 126/89.  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  Denies exertional chest pain or breathlessness.  Sometimes, when his blood pressure is highest, he says he feels a little bit fuzzy in the head.  Patient Active Problem List   Diagnosis Code    Knee pain 719.46    Shoulder pain 719.41    Impaired fasting glucose 790.21    Chest pain 786.50    Unspecified essential hypertension 401.9       Allergies: Penicillins    Medication list reviewed, no changes made  Allergy list reviewed  Problem list reviewed    OBJECTIVE:  Filed Vitals:    08/01/13 1529   BP: 160/90   Pulse: 80   Height: 1.778 m (5\' 10" )   Weight: 97.886 kg (215 lb 12.8 oz)   Alert, oriented, no distress, appears well    ASSESSMENT/PLAN:  Hypertension-blood pressure is may be improving when checked outside the office.  I've asked him to increase benazepril to 20 mg daily, continue amlodipine 10 mg daily.  He's going to return for a blood pressure check in the office at the end of the week.  I've also suggested that he get a home blood pressure monitor, see can track his blood pressure more readily at home.  We might need to go so far as to have an ambulatory blood pressure monitor done to confirm normotensive state outside the office.  Discussed all this with him in detail.  He verbalized understanding and will followup as requested.    Have also asked her to do some blood work today, to look for secondary causes of hypertension such as thyroid dysfunction, renal dysfunction et Ronney Asters.    This note  was dictated  using Systems analyst. Reasonable attempts to proofread were made, but transcription errors may occur.

## 2013-08-02 NOTE — Telephone Encounter (Signed)
Advised pt;   -  His blood work is mostly okay. Normal kidney function, normal blood sugar. Normal thyroid. Nothing that would cause high blood pressure.    His cholesterol could be better. His HDL good cholesterol is low which is the major problem. There is no medicine for this. Exercise, decreasing starches and sugars in the diet could help.    As far as blood pressure goes, he is planning to come back on Friday for a BP check. Will bring his home blood pressure cuff with him at that time.

## 2013-08-02 NOTE — Telephone Encounter (Signed)
His blood work is mostly okay.  Normal kidney function, normal blood sugar.  Normal thyroid.  Nothing that would cause high blood pressure.    His cholesterol could be better. His HDL good cholesterol is low which is the major problem.  There is no medicine for this.  Exercise, decreasing starches and sugars in the diet could help.    As far as blood pressure goes, he is planning to come back on Friday for a BP check.  Will bring his home blood pressure cuff with him at that time.

## 2013-08-04 ENCOUNTER — Telehealth: Payer: Self-pay | Admitting: Primary Care

## 2013-08-04 DIAGNOSIS — R7301 Impaired fasting glucose: Secondary | ICD-10-CM

## 2013-08-04 NOTE — Telephone Encounter (Signed)
Left message for pt to call the office.

## 2013-08-04 NOTE — Telephone Encounter (Signed)
Advised pt:   -  Blood work-sent him a message for couple days ago that everything was normal.    There was a second blood sugar test still running in the lab, the hemoglobin A1c. This came back yesterday elevated. Normal is up to 6.0. His was 6.8. Anything greater than 6.5 is considered consistent with diabetes.    At this point, I'm going to ask that he watch his sugar and starch intake, try to cut back if possible, repeat blood work in about a month. If the hemoglobin A1c is still elevated in the diabetic range, I will need asked him to come back so we can discuss further.

## 2013-08-04 NOTE — Telephone Encounter (Signed)
Blood work-sent him a message for couple days ago that everything was normal.    There was a second blood sugar test still running in the lab, the hemoglobin A1c.  This came back yesterday elevated.  Normal is up to 6.0.  His was 6.8.  Anything greater than 6.5 is considered consistent with diabetes.    At this point, I'm going to ask that he watch his sugar and starch intake, try to cut back if possible, repeat blood work in about a month.  If the hemoglobin A1c is still elevated in the diabetic range, I will need asked him to come back so we can discuss further.

## 2013-08-05 ENCOUNTER — Telehealth: Payer: Self-pay | Admitting: Primary Care

## 2013-08-05 ENCOUNTER — Ambulatory Visit: Payer: Self-pay

## 2013-08-05 DIAGNOSIS — R7301 Impaired fasting glucose: Secondary | ICD-10-CM

## 2013-08-05 NOTE — Telephone Encounter (Signed)
Physician letter faxed by secretary. Pt notified that letter was faxed and nutrition referral was sent in.

## 2013-08-05 NOTE — Telephone Encounter (Signed)
Letter done.     Ref to endo dietician ordered

## 2013-08-05 NOTE — Telephone Encounter (Signed)
Pt walked in for BP check. Pt's machine 135/83 P 58, Manual 138/82 P 60. Pt brought a list of his home BP's.  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 Please call him when done.  Pt also asking if we have a nutritionist or someone to help him and his wife to decrease his carb and sugar intake d/t his elevated A1C?

## 2013-08-05 NOTE — Progress Notes (Signed)
Pt walked in for BP check. Pt's machine 135/83 P 58, Manual 138/82 P 60. Pt brought a list of his home BP's.  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 Please call him when done.  Pt also asking if we have a nutritionist or someone to help him and his wife to decrease his carb and sugar intake d/t his elevated A1C?

## 2013-08-18 ENCOUNTER — Ambulatory Visit: Payer: Self-pay | Admitting: Nutrition

## 2013-08-18 ENCOUNTER — Ambulatory Visit
Admit: 2013-08-18 | Discharge: 2013-08-18 | Disposition: A | Discharge status: Home / Self Care | Payer: Self-pay | Source: Ambulatory Visit | Attending: Nutrition | Admitting: Nutrition

## 2013-08-18 NOTE — Progress Notes (Signed)
Lance Peters is here today for Medical Nutrition Therapy regarding Newly diagnosed Diabetes Type 2.   Wife, Jackelyn Poling present  Cut almost all carbs out of diet- sometimes light headed  Down 12 lb in 5 weeks    Vitals:  There were no vitals taken for this visit.     Wt Readings from Last 3 Encounters:   08/01/13 97.886 kg (215 lb 12.8 oz)   06/02/13 97.977 kg (216 lb)   09/27/12 91.627 kg (202 lb)     Lab Results   Component Value Date    HA1C 6.8* 08/01/2013    CREAT 1.10 08/01/2013    LDLC 128 08/01/2013         Assessment:   Diet: 3 low carb meals per day; stopped soda, candy  Exercise : patient has a physically demanding job    DM Meds: none   Meter:   not monitoring.      Coun/Edu:     Barriers to learning:  None  Education Provided: Plate Method carb counting and label reading; A1c goal   Nutrition Goals:   180 calories  180-210 gm carbs  Weight goal: (-10%) 180 lb  Plan:     Follow-up visit:  3 months  Time spent counseling patient: 55 minutes      Marjo Bicker, RD, CDE

## 2013-09-06 ENCOUNTER — Telehealth: Payer: Self-pay | Admitting: Primary Care

## 2013-09-06 NOTE — Telephone Encounter (Signed)
Advised pt:   -     Please remind him to do repeat fasting blood work sometime soon, per telephone message of April 30. Need to recheck his blood sugar levels. I probably should see him afterwards to discuss

## 2013-09-06 NOTE — Telephone Encounter (Signed)
Left message for pt to call the office.

## 2013-09-06 NOTE — Telephone Encounter (Signed)
Please remind him to do repeat fasting blood work sometime soon, per telephone message of April 30.  Need to recheck his blood sugar levels.  I probably should see him afterwards to discuss.

## 2013-09-07 ENCOUNTER — Ambulatory Visit
Admit: 2013-09-07 | Discharge: 2013-09-07 | Disposition: A | Discharge status: Home / Self Care | Payer: Self-pay | Source: Ambulatory Visit | Attending: Primary Care | Admitting: Primary Care

## 2013-09-07 DIAGNOSIS — R7301 Impaired fasting glucose: Secondary | ICD-10-CM

## 2013-09-07 DIAGNOSIS — Z1159 Encounter for screening for other viral diseases: Secondary | ICD-10-CM

## 2013-09-07 LAB — BASIC METABOLIC PANEL
Anion Gap: 15 (ref 7–16)
CO2: 24 mmol/L (ref 20–28)
Calcium: 8.8 mg/dL (ref 8.6–10.2)
Chloride: 104 mmol/L (ref 96–108)
Creatinine: 1.19 mg/dL — ABNORMAL HIGH (ref 0.67–1.17)
GFR,Black: 79 *
GFR,Caucasian: 69 *
Glucose: 122 mg/dL — ABNORMAL HIGH (ref 60–99)
Lab: 18 mg/dL (ref 6–20)
Potassium: 4.3 mmol/L (ref 3.3–5.1)
Sodium: 143 mmol/L (ref 133–145)

## 2013-09-07 LAB — LIPID PANEL
Chol/HDL Ratio: 5.8
Cholesterol: 221 mg/dL — AB
HDL: 38 mg/dL
LDL Calculated: 146 mg/dL
Non HDL Cholesterol: 183 mg/dL
Triglycerides: 184 mg/dL — AB

## 2013-09-07 LAB — HEMOGLOBIN A1C: Hemoglobin A1C: 6 % (ref 4.0–6.0)

## 2013-09-08 LAB — MICROALBUMIN, URINE, RANDOM
Creatinine,UR: 309 mg/dL — ABNORMAL HIGH (ref 20–300)
Microalb/Creat Ratio: 1.3 mg MA/g CR (ref 0.0–29.9)
Microalbumin,UR: 0.4 mg/dL

## 2013-09-08 LAB — HEPATITIS C ANTIBODY: Hep C Ab: NEGATIVE

## 2013-09-09 ENCOUNTER — Ambulatory Visit: Payer: Self-pay | Admitting: Primary Care

## 2013-09-09 ENCOUNTER — Encounter: Payer: Self-pay | Admitting: Primary Care

## 2013-09-09 VITALS — BP 120/80 | HR 68 | Ht 70.0 in | Wt 200.0 lb

## 2013-09-09 DIAGNOSIS — R7301 Impaired fasting glucose: Secondary | ICD-10-CM

## 2013-09-09 NOTE — Progress Notes (Signed)
Lance Peters is a 53 y.o. male   09/09/2013    Chief Complaint   Patient presents with    Results       HPI:  Here for followup of impaired fasting glucose and hypertension.    Since his last visit here, he has no her nutritionist.  He says that he has changed his dietary practices, eating a lot of fruits and vegetables, avoiding carbohydrates and sweets.  Blood work looks a lot better, hemoglobin A1c now down to 6.0.  Triglycerides also improved.  LDL still a little above target.    He has lost about 15 pounds    Blood pressure is on target.    He feels well, does not have any specific concerns for today otherwise    Nonsmoker  Patient Active Problem List   Diagnosis Code    Knee pain 719.46    Shoulder pain 719.41    Impaired fasting glucose 790.21    Chest pain 786.50    Unspecified essential hypertension 401.9       Allergies: Penicillins    Medication list reviewed, no changes made  Allergy list reviewed  Problem list reviewed    OBJECTIVE:  Filed Vitals:    09/09/13 1513   BP: 120/80   Pulse: 68   Height: 1.778 m (5\' 10" )   Weight: 90.719 kg (200 lb)   Alert, oriented, no distress, appears well    ASSESSMENT/PLAN:  Impaired fasting glucose, with hemoglobin A1c now normal at 6.0.  Congratulated him on his progress with my stomach modification, exercise, weight loss.  Encouraged him to continue.  We will recheck labs, weight, blood pressure in 6 months.    Attention-blood pressure on target, tolerating medication, continue.  If he continues to stick with the lifestyle changes, and lose weight, we may be of the back off on his BP meds.    This note was dictated  using Systems analyst. Reasonable attempts to proofread were made, but transcription errors may occur.

## 2013-12-29 ENCOUNTER — Other Ambulatory Visit: Payer: Self-pay | Admitting: Primary Care

## 2013-12-30 MED ORDER — AMLODIPINE BESYLATE 10 MG PO TABS *I*
10.0000 mg | ORAL_TABLET | Freq: Every day | ORAL | Status: DC
Start: 2013-12-30 — End: 2014-12-30

## 2014-03-08 ENCOUNTER — Ambulatory Visit
Admit: 2014-03-08 | Discharge: 2014-03-08 | Disposition: A | Discharge status: Home / Self Care | Payer: Self-pay | Source: Ambulatory Visit | Attending: Primary Care | Admitting: Primary Care

## 2014-03-08 DIAGNOSIS — R7301 Impaired fasting glucose: Secondary | ICD-10-CM

## 2014-03-08 LAB — LIPID PANEL
Chol/HDL Ratio: 5.1
Cholesterol: 226 mg/dL — AB
HDL: 44 mg/dL
LDL Calculated: 133 mg/dL
Non HDL Cholesterol: 182 mg/dL
Triglycerides: 246 mg/dL — AB

## 2014-03-08 LAB — BASIC METABOLIC PANEL
Anion Gap: 11 (ref 7–16)
CO2: 27 mmol/L (ref 20–28)
Calcium: 9 mg/dL (ref 8.6–10.2)
Chloride: 101 mmol/L (ref 96–108)
Creatinine: 1.24 mg/dL — ABNORMAL HIGH (ref 0.67–1.17)
GFR,Black: 75 *
GFR,Caucasian: 65 *
Glucose: 135 mg/dL — ABNORMAL HIGH (ref 60–99)
Lab: 22 mg/dL — ABNORMAL HIGH (ref 6–20)
Potassium: 4.7 mmol/L (ref 3.3–5.1)
Sodium: 139 mmol/L (ref 133–145)

## 2014-03-08 LAB — HEMOGLOBIN A1C: Hemoglobin A1C: 5.6 % (ref 4.0–6.0)

## 2014-03-10 ENCOUNTER — Encounter: Payer: Self-pay | Admitting: Primary Care

## 2014-03-10 ENCOUNTER — Ambulatory Visit: Payer: Self-pay | Admitting: Primary Care

## 2014-03-10 VITALS — BP 132/80 | HR 78 | Ht 70.0 in | Wt 200.4 lb

## 2014-03-10 DIAGNOSIS — E785 Hyperlipidemia, unspecified: Secondary | ICD-10-CM

## 2014-03-10 DIAGNOSIS — M1990 Unspecified osteoarthritis, unspecified site: Secondary | ICD-10-CM

## 2014-03-10 DIAGNOSIS — R7301 Impaired fasting glucose: Secondary | ICD-10-CM

## 2014-03-10 DIAGNOSIS — I10 Essential (primary) hypertension: Secondary | ICD-10-CM

## 2014-03-10 MED ORDER — ATORVASTATIN CALCIUM 20 MG PO TABS *I*
20.0000 mg | ORAL_TABLET | Freq: Every day | ORAL | Status: DC
Start: 2014-03-10 — End: 2014-09-02

## 2014-03-10 MED ORDER — MELOXICAM 7.5 MG PO TABS *I*
7.5000 mg | ORAL_TABLET | Freq: Every day | ORAL | Status: DC
Start: 2014-03-10 — End: 2014-04-21

## 2014-03-13 DIAGNOSIS — E785 Hyperlipidemia, unspecified: Secondary | ICD-10-CM | POA: Insufficient documentation

## 2014-03-13 DIAGNOSIS — M1990 Unspecified osteoarthritis, unspecified site: Secondary | ICD-10-CM | POA: Insufficient documentation

## 2014-03-13 NOTE — Progress Notes (Signed)
Lance Peters is a 53 y.o. male   03/10/2014    Chief Complaint   Patient presents with    Results     review blood work       HPI:  Here for followup of chronic medical problems, including impaired fasting glucose, hypertension    Overall feels pretty well.  He is most bothered by arthritis of various joints, elbows, shoulders, knees.  No muscle pain.  Sore and stiff when first getting up in the morning, a little better through the day.    He is taking all of his medications as prescribed, and as listed in the medication list.    Reviewed his blood work.  Fasting glucoses in the diabetic range, although hemoglobin A1c is now normal.  LDL cholesterol is above target especially for considering diabetes.  Creatinine a little elevated, but appears to be more or less at baseline, just a little bit high, GFR calculates normal.     Patient Active Problem List   Diagnosis Code    Knee pain M25.569    Shoulder pain M25.519    Impaired fasting glucose R73.01    Chest pain R07.9    Essential hypertension I10       Allergies: Penicillins    Medication list reviewed, changes made  Allergy list reviewed  Problem list reviewed    OBJECTIVE:  Filed Vitals:    03/10/14 1533   BP: 132/80   Pulse: 78   Height: 1.778 m (5\' 10" )   Weight: 90.901 kg (200 lb 6.4 oz)     GENERAL:  Alert, no distress  HEENT: No ocular discharge.     NECK:  Trachea midline, thyroid gland normal, without masses; no significant adenopathy  LUNGS:  Normal respiratory effort, Clear to auscultation bilaterally, without wheezes or crackles  CV: Regular rate and rhythm, S1 and S2 are normal, without murmurs rubs or gallops.  Carotid pulses are normal, without bruit      ASSESSMENT/PLAN:  Impaired fasting glucose-with blood glucose over 130, his fasting sugar now is in a diabetic range, but we'll have one such reading, and his hemoglobin A1c remains in the normal range.  Therefore is not diagnosed with diabetes at this point.  We discussed implications.   Recommended at attention to diet, which we discussed in detail.  Daily exercise.  He will recheck a hemoglobin A1c in about 3 months, and full labs with followup with me in 6 months.  The 3 month interval we will followup with through my chart.    Hypertension-blood pressure is on target according to the current guidelines, continue ACE inhibitor and calcium channel blocker    Hyperlipidemia-with impaired fasting glucose/incipient diabetes, I think it would be prudent for him to be on a statin.  We discussed the rationale for this.  He is in agreement, and we'll try atorvastatin.  I asked him to watch for muscle pain (as distinct from his current joint pains.)  If he experiences unexplained muscle pain, he should call the office immediately, stop taking atorvastatin.  He verbalized understanding and agreement    Diffuse arthralgias, probably do to age-related arthritis.  Nothing to suggest an inflammatory process.  None of the symptoms bother him enough to pursue physical therapy or orthopedics evaluation.  Will have him try meloxicam, to see whether that provides adequate relief.

## 2014-04-21 ENCOUNTER — Ambulatory Visit: Payer: Self-pay | Admitting: Primary Care

## 2014-04-21 ENCOUNTER — Encounter: Payer: Self-pay | Admitting: Primary Care

## 2014-04-21 VITALS — BP 160/100 | HR 78 | Ht 70.0 in | Wt 203.0 lb

## 2014-04-21 DIAGNOSIS — F32A Depression, unspecified: Secondary | ICD-10-CM

## 2014-04-21 DIAGNOSIS — I10 Essential (primary) hypertension: Secondary | ICD-10-CM

## 2014-04-21 MED ORDER — SERTRALINE HCL 50 MG PO TABS *I*
50.0000 mg | ORAL_TABLET | Freq: Every day | ORAL | Status: DC
Start: 2014-04-21 — End: 2014-10-16

## 2014-04-21 MED ORDER — MELOXICAM 15 MG PO TABS *I*
15.0000 mg | ORAL_TABLET | Freq: Every day | ORAL | Status: DC
Start: 2014-04-21 — End: 2014-12-07

## 2014-04-21 NOTE — Progress Notes (Signed)
Lance Peters is a 54 y.o. male   04/21/2014    Chief Complaint   Patient presents with    Anxiety    Depression       HPI:  Here with concern about possible diagnosis of depression.  Symptoms have been building for the last 6 months, and it got bad enough that he now wants to seek treatment.  His symptoms include   Depressed mood   Loss of interest in things that used to be foreigner pleasurable   Increasing irritability   Difficulty sleeping because of racing thoughts   Decreased concentration   He's not suicidal    He thinks his probably had trouble with depression for much of his life, but has always been able to deal with it.  Lately, things have been getting worse.    Blood pressure elevated today.  He is taking his medications as prescribed.  Hasn't been sleeping, suspect that the blood pressure elevation is due to the depression issues as above.  Patient Active Problem List   Diagnosis Code    Knee pain M25.569    Shoulder pain M25.519    Impaired fasting glucose R73.01    Chest pain R07.9    Essential hypertension I10    Hyperlipidemia E78.5    Arthritis M19.90       Allergies: Penicillins    Medication list reviewed, no changes made  Allergy list reviewed  Problem list reviewed    OBJECTIVE:  Filed Vitals:    04/21/14 1328   BP: 160/100   Pulse: 78   Height: 1.778 m (5\' 10" )   Weight: 92.08 kg (203 lb)   Alert, oriented, mood and affect appropriate, to eye contact, no evidence of external stimuli      ASSESSMENT/PLAN:  Depression, likely meets diagnostic requirements for major depression disorder.  Recommended initiation of SSRI treatment, Zoloft.  Discussed possible side effects.  He's not suicidal, not high risk suicide group.  He is not homicidal.  He's also possibly interested in cognitive behavioral therapy, gave him contact information for [employer] to contact if he wishes to proceed.  Also recommended daily exercise, at least 30 minutes a day.  He expressed interest in returning to  the [facility] for exercise.    Hypertension-blood pressure today is above target, but likely due to untreated depression, lack of sleep etc.  Will recheck when he returns in one month for his followup.    He will follow up in one month, but asked him to call sooner if He experiencesany unacceptable side effects from the medicine.

## 2014-05-22 ENCOUNTER — Ambulatory Visit: Payer: Self-pay | Admitting: Primary Care

## 2014-05-22 ENCOUNTER — Encounter: Payer: Self-pay | Admitting: Primary Care

## 2014-05-22 VITALS — BP 112/78 | HR 74 | Ht 70.0 in | Wt 204.2 lb

## 2014-05-22 DIAGNOSIS — I10 Essential (primary) hypertension: Secondary | ICD-10-CM

## 2014-05-22 DIAGNOSIS — F32A Depression, unspecified: Secondary | ICD-10-CM

## 2014-05-23 ENCOUNTER — Encounter: Payer: Self-pay | Admitting: Primary Care

## 2014-05-23 DIAGNOSIS — F32A Depression, unspecified: Secondary | ICD-10-CM

## 2014-05-23 HISTORY — DX: Depression, unspecified: F32.A

## 2014-05-23 NOTE — Progress Notes (Signed)
Lance Peters is a 54 y.o. male   05/22/2014    Chief Complaint   Patient presents with    Hypertension    Depression       HPI:  Here for followup of depression.  Started sertraline at his last appointment, he says he feels a lot better, depression no longer an issue.  He's not suicidal.  Has not noticed any side effects from the sertraline.    Also followed for hypertension.  Taking his medication as prescribed.  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  No side effects from the blood pressure meds.  Patient Active Problem List   Diagnosis Code    Knee pain M25.569    Shoulder pain M25.519    Impaired fasting glucose R73.01    Chest pain R07.9    Essential hypertension I10    Hyperlipidemia E78.5    Arthritis M19.90       Allergies: Penicillins    Medication list reviewed, changes made  Allergy list reviewed  Problem list reviewed    OBJECTIVE:  Filed Vitals:    05/22/14 1535   BP: 112/78   Pulse:    Height:    Weight:    Alert, oriented, no distress, appears well.  Mood and affect are appropriate.      ASSESSMENT/PLAN:  Depression-well managed on current dose of sertraline, without side effects, patient is happy with his progress, no new interventions, continue sertraline    Hypertension-blood pressure is on target according to guidelines, tolerating his medications, continue medication, diet, exercise as previously discussed.    Medication instructions were discussed with patient (and advocate). Advised that written instructions will also be provided by the pharmacy. Anticipated side effects discussed. Encouraged patient to call with any questions or concerns.

## 2014-07-02 ENCOUNTER — Other Ambulatory Visit: Payer: Self-pay | Admitting: Primary Care

## 2014-07-20 ENCOUNTER — Ambulatory Visit
Admit: 2014-07-20 | Discharge: 2014-07-20 | Disposition: A | Discharge status: Home / Self Care | Payer: Self-pay | Source: Ambulatory Visit | Attending: Primary Care | Admitting: Primary Care

## 2014-07-20 ENCOUNTER — Ambulatory Visit: Payer: Self-pay | Admitting: Internal Medicine

## 2014-07-20 ENCOUNTER — Encounter: Payer: Self-pay | Admitting: Internal Medicine

## 2014-07-20 VITALS — BP 152/94 | HR 62 | Ht 69.0 in | Wt 200.0 lb

## 2014-07-20 DIAGNOSIS — M25562 Pain in left knee: Secondary | ICD-10-CM

## 2014-07-20 DIAGNOSIS — M2450 Contracture, unspecified joint: Secondary | ICD-10-CM

## 2014-07-20 DIAGNOSIS — S8992XA Unspecified injury of left lower leg, initial encounter: Secondary | ICD-10-CM

## 2014-07-20 MED ORDER — IBUPROFEN 600 MG PO TABS *I*
600.0000 mg | ORAL_TABLET | Freq: Three times a day (TID) | ORAL | Status: DC | PRN
Start: 2014-07-20 — End: 2014-07-25

## 2014-07-20 NOTE — Patient Instructions (Addendum)
Stop the meloxicam, instead take ibuprofen 600mg  three times a day for 5-7 days.   Once you stop the ibuprofen, okay to take the meloxicam again, but shouldn't take both together.   Plan to see orthopedics for your knee. They will call you to schedule appt.   Call for any new or worsening symptoms, swelling in the calf, or any other concerns. The hematoma, bruising and swelling will take several weeks to go down but with ice and elevation, should start to improve soon.

## 2014-07-20 NOTE — Progress Notes (Signed)
Chief Complaint   Patient presents with    Leg Injury     [place] Sat and injured L shin and now L ankle is swollen       HPI:  Lance Peters comes in for slipped and fell off a large rock fishing on 4/9.  He landed on his anterior shin and had a large hematoma in this area. It was tender to ambulate but he felt okay, as the week has gone on, he has developed swelling and tenderness lower in the anterior shin and then this AM woke up with swelling and ecchymosis in the ankle which concerned him. The hematoma has gone down significantly in size.  Ankle is sore and hurts to ambulate today. He has been taking ibuprofen 400mg  in the AM and 600 at bedtime in addition to the meloxicam, we reviewed that he should use one or the other, not both.. He is on his feet all day.     He also slipped and fell several months ago and sustained a flexion-type injury to the left knee - he landed with his foot underneath him. Ever since, he feels the knee is not as stable and has severe sharp pain in the anterior aspect of the knee whenever he bears his full weight on it.    No numbness/tingling or weakness.     MEDICAL PROBLEMS:  Patient Active Problem List   Diagnosis Code    Knee pain M25.569    Shoulder pain M25.519    Impaired fasting glucose R73.01    Chest pain R07.9    Essential hypertension I10    Hyperlipidemia E78.5    Arthritis M19.90    Depression F32.9        CURRENT MEDICATIONS:  Prior to Admission medications    Medication Sig Start Date End Date Taking? Authorizing Provider   amLODIPine (NORVASC) 10 MG tablet TAKE 1 TABLET BY MOUTH EVERY DAY 07/03/14  Yes Briant Cedar, DO   sertraline (ZOLOFT) 50 MG tablet Take 1 tablet (50 mg total) by mouth daily 04/21/14 10/18/14 Yes Penni Bombard, MD   meloxicam (MOBIC) 15 MG tablet Take 1 tablet (15 mg total) by mouth daily   Take with food. 04/21/14  Yes Penni Bombard, MD   benazepril (LOTENSIN) 10 MG tablet  02/18/14  Yes [provider]    atorvastatin (LIPITOR) 20 MG tablet Take 1 tablet (20 mg total) by mouth daily (with dinner) 03/10/14 09/06/14 Yes Penni Bombard, MD   blood pressure monitor Use to monitor home BP. Dx = hypertension 08/01/13  Yes Penni Bombard, MD   aspirin 81 MG tablet Take 81 mg by mouth daily   Yes [provider]      Medications reviewed with patient, reconciled and no changes made today.    ALLERGIES:  Penicillins   Allergies reviewed with patient and confirmed today.    EXAMINATION:  Filed Vitals:    07/20/14 1601 07/20/14 1704   BP: 160/100 152/94   Pulse: 62    Height: 1.753 m (5\' 9" )    Weight: 90.719 kg (200 lb)    SpO2: 97%                                  Body mass index is 29.52 kg/(m^2).  BP Readings from Last 4 Encounters:   07/20/14 160/100   05/22/14 112/78   04/21/14 160/100   03/10/14 132/80  Wt Readings from Last 4 Encounters:   07/20/14 90.719 kg (200 lb)   05/22/14 92.625 kg (204 lb 3.2 oz)   04/21/14 92.08 kg (203 lb)   03/10/14 90.901 kg (200 lb 6.4 oz)         Physical examination:  General: Well-appearing and well-nourished, nontoxic, no acute distress.  HEENT: Normocephalic, Atraumatic. Normal Conjunctiva. .  Lungs: Breathing is unlabored.  No respiratory distress, respiratory rate 16  Extremities: Left leg: He has a hematoma on the proximal anterior shin, below the tibial plateau but there is some ecchymosis extending up to the tibial plateau.  He has no fibular tenderness.  He has some generalized knee tenderness but no swelling or ecchymosis.  No effusion.  He has some edema going down the leg and some swelling and ecchymosis around the ankle particularly medially which is the site of the hematoma, I suspect that this is mostly dependent but he does have some diffuse tenderness of the ankle as well.  DP/PT 2+ and equal bilaterally, good range of motion of the digits and sensation intact to touch distally.  Neurological: Alert and oriented x3 no focal neurological deficits  noted.      ASSESSMENT/PLAN:  1. Left leg injury: I sent the patient for x-rays of the tib-fib as well as ankle, I do not appreciate any fracture.  I suspect his symptoms are related to hematoma with some dependent traveling of the ecchymosis and edema.  I advised ice and elevation.  I recommended he discontinue the meloxicam temporarily if he is going to be taking the ibuprofen and advised he can do 600 mg of ibuprofen up to 3 times a day for the short-term.    2. Left knee pain: X-ray did not reveal any fracture, I suspect he may have had a ligamentous injury given the mechanism and description, referring him to Ortho for further eval and management.      Note that his BP is elevated today and I suspect that this is related in part to excessive NSAID use but also due to his pain today.  Patient states it has been running okay at home and he will continue to keep a close eye on it.    Patient verbalizes understanding of this plan and is agreeable.  Counseled him on supportive measures and symptoms that should prompt call.    Patient Instructions   Stop the meloxicam, instead take ibuprofen 600mg  three times a day for 5-7 days.   Once you stop the ibuprofen, okay to take the meloxicam again, but shouldn't take both together.   Plan to see orthopedics for your knee. They will call you to schedule appt.   Call for any new or worsening symptoms, swelling in the calf, or any other concerns. The hematoma, bruising and swelling will take several weeks to go down but with ice and elevation, should start to improve soon.

## 2014-07-22 ENCOUNTER — Other Ambulatory Visit: Payer: Self-pay | Admitting: Primary Care

## 2014-07-25 ENCOUNTER — Encounter: Payer: Self-pay | Admitting: Internal Medicine

## 2014-07-25 ENCOUNTER — Ambulatory Visit: Payer: Self-pay | Admitting: Internal Medicine

## 2014-07-25 ENCOUNTER — Encounter: Payer: Self-pay | Admitting: Gastroenterology

## 2014-07-25 ENCOUNTER — Telehealth: Payer: Self-pay | Admitting: Internal Medicine

## 2014-07-25 VITALS — BP 120/70 | HR 92 | Ht 69.0 in | Wt 200.0 lb

## 2014-07-25 DIAGNOSIS — M79662 Pain in left lower leg: Secondary | ICD-10-CM

## 2014-07-25 DIAGNOSIS — M7989 Other specified soft tissue disorders: Secondary | ICD-10-CM

## 2014-07-25 DIAGNOSIS — S8012XD Contusion of left lower leg, subsequent encounter: Secondary | ICD-10-CM

## 2014-07-25 MED ORDER — IBUPROFEN 600 MG PO TABS *I*
600.0000 mg | ORAL_TABLET | Freq: Three times a day (TID) | ORAL | Status: DC | PRN
Start: 2014-07-25 — End: 2014-10-03

## 2014-07-25 NOTE — Telephone Encounter (Signed)
Called patient and let him know he was negative for DVT.  He agreed to continue with plan as discussed.

## 2014-07-25 NOTE — Patient Instructions (Signed)
Elevate above the heart for 30 minutes four times a day.   Ice 20 minutes on 20 minutes off at least 3 times a day.   Call for any worsening pain, swelling, numbness, tingling or any other concerns.   Follow up Next Wednesday 4/27 at 4:30

## 2014-07-25 NOTE — Progress Notes (Signed)
Chief Complaint   Patient presents with    Leg Pain     L leg - worse       HPI:  Lance Peters comes in for left foot and ankle swelling seems worse, he's been trying to stay off it but has been keeping it elevated as high as he should be.  He sustained a large hematoma to the left anterior tibia and subsequently developed some dependent edema and ecchymosis in the foot and ankle.  He's been taking ibuprofen 600 mg 3 times a day for this.  Despite keeping off of it for the past 5 days, he continues to battle the pain and swelling.  He states that it sometimes looks very bruised and other times it's not so bad.  Seems to be worse in the evening and overnight, once he gets up and moving it is okay early in the day.  He states that when it is very swollen last night it was tingling and itching but this has resolved now.  He does have some pain in the posterior calf.    MEDICAL PROBLEMS:  Patient Active Problem List   Diagnosis Code    Knee pain M25.569    Shoulder pain M25.519    Impaired fasting glucose R73.01    Chest pain R07.9    Essential hypertension I10    Hyperlipidemia E78.5    Arthritis M19.90    Depression F32.9        CURRENT MEDICATIONS:  Prior to Admission medications    Medication Sig Start Date End Date Taking? Authorizing Provider   ibuprofen (ADVIL,MOTRIN) 600 MG tablet Take 1 tablet (600 mg total) by mouth 3 times daily as needed for Pain 07/25/14  Yes Collier Bullock, PA   benazepril (LOTENSIN) 10 MG tablet TAKE 1 TABLET BY MOUTH EVERY DAY 07/23/14  Yes Penni Bombard, MD   amLODIPine (NORVASC) 10 MG tablet TAKE 1 TABLET BY MOUTH EVERY DAY 07/03/14  Yes Briant Cedar, DO   sertraline (ZOLOFT) 50 MG tablet Take 1 tablet (50 mg total) by mouth daily 04/21/14 10/18/14 Yes Penni Bombard, MD   benazepril (LOTENSIN) 10 MG tablet  02/18/14  Yes [provider]   atorvastatin (LIPITOR) 20 MG tablet Take 1 tablet (20 mg total) by mouth daily (with dinner) 03/10/14 09/06/14 Yes Penni Bombard, MD   blood pressure monitor Use to monitor home BP. Dx = hypertension 08/01/13  Yes Penni Bombard, MD   aspirin 81 MG tablet Take 81 mg by mouth daily   Yes [provider]   meloxicam (MOBIC) 15 MG tablet Take 1 tablet (15 mg total) by mouth daily   Take with food. 04/21/14   Penni Bombard, MD      Medications reviewed with patient, reconciled and no changes made today.    ALLERGIES:  Penicillins   Allergies reviewed with patient and confirmed today.    EXAMINATION:  Filed Vitals:    07/25/14 1051   BP: 120/70   Pulse: 92   Height: 1.753 m (5\' 9" )   Weight: 90.719 kg (200 lb)   SpO2: 96%                                 Body mass index is 29.52 kg/(m^2).  BP Readings from Last 4 Encounters:   07/25/14 120/70   07/20/14 152/94   05/22/14 112/78   04/21/14 160/100  Wt Readings from Last 4 Encounters:   07/25/14 90.719 kg (200 lb)   07/20/14 90.719 kg (200 lb)   05/22/14 92.625 kg (204 lb 3.2 oz)   04/21/14 92.08 kg (203 lb)         Physical examination:  General: Well-appearing and well-nourished, nontoxic, no acute distress.  HEENT: Normocephalic, Atraumatic. Normal Conjunctiva. N  Lungs: Breathing is unlabored.  No respiratory distress, respiratory rate 16.    Extremities: There is mild swelling and ecchymosis in the left ankle and foot. Good range of motion. DP/PT 2+, foot is warm, sensation is intact to touch, cap refill brisk to digits and good range of motion of digits. There is a persistent hematoma on the left anterior/medial tib/fib. There is no longer any visible ecchymosis here. Calf is supple but some tenderness, no palpable cords.   Neurological: Alert and oriented x3 no focal neurological deficits noted.      ASSESSMENT/PLAN:  Right leg pain/swelling: Suspect this is all secondary to hematoma, no significant swelling or evidence of compartment syndrome.  We discussed the importance of really getting this elevated above the level of the heart multiple times a day.  Patient  states he has not been doing this.  I also advised he continue ice and ibuprofen.  I am sending him for an ultrasound to rule out DVT as he is very concerned for this however I think this is unlikely.  Reviewed with him supportive measures and symptoms that should prompt call.  As he is on his feet all day at work, he will remain out of work for the next week I'll follow-up with him next week.  He does have an appointment with Ortho next week to evaluate his left knee which he sustained a previous injury to it as well. Patient agreeable with this plan.    Patient Instructions   Elevate above the heart for 30 minutes four times a day.   Ice 20 minutes on 20 minutes off at least 3 times a day.   Call for any worsening pain, swelling, numbness, tingling or any other concerns.   Follow up Next Wednesday 4/27 at 4:30      Addendum: 12:20 received call from [facility], pt negative for DVT

## 2014-07-25 NOTE — Telephone Encounter (Signed)
Please call and advise pt he was negative for DVT. Continue with plan as we dicussed today. Thanks.

## 2014-08-02 ENCOUNTER — Ambulatory Visit: Payer: Self-pay | Admitting: Sports Medicine

## 2014-08-02 ENCOUNTER — Encounter: Payer: Self-pay | Admitting: Sports Medicine

## 2014-08-02 ENCOUNTER — Ambulatory Visit: Payer: Self-pay | Admitting: Internal Medicine

## 2014-08-02 ENCOUNTER — Other Ambulatory Visit: Payer: Self-pay | Admitting: Sports Medicine

## 2014-08-02 VITALS — BP 136/76 | HR 79 | Ht 69.69 in | Wt 202.3 lb

## 2014-08-02 DIAGNOSIS — S8002XA Contusion of left knee, initial encounter: Secondary | ICD-10-CM

## 2014-08-02 DIAGNOSIS — M25562 Pain in left knee: Secondary | ICD-10-CM

## 2014-08-02 DIAGNOSIS — M1712 Unilateral primary osteoarthritis, left knee: Secondary | ICD-10-CM

## 2014-08-02 NOTE — Progress Notes (Signed)
Chief complaint: patient presents with ongoing left knee pain over the past month, medial in nature but as well separate issue of contusion to the knee with a large hematoma medial proximal tibia    HPI: This 54 year old male who presents with ongoing left medial knee pain.  This started about a month ago.  He thinks he injured this when he slipped in his driveway on ice.  He describes a flexed knee fall where he sat on his leg and hyperflexed this.  Since then he's been having medial pain.  Relatively points to the medial joint line is painful.  Some increased pain with pivoting and/or uneven ground.    Separate issue of contusion to the left knee.  He was [place] fishing 2 weeks ago.  He fell on a rock and had a direct blow contusion over the proximal medial tibia.  He had significant hematoma which is resolving although still pretty swollen.  Subsequently, he had lower leg and ankle swelling.  Therefore, He had an ultrasound of that which was negative for DVT but showed fluid collection medial calf/leg..    Patient has been out of work the past 2 weeks as his knee was worsening with trying to continuing working.    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 90 tablet 1    benazepril (LOTENSIN) 10 MG tablet 20 mg daily         atorvastatin (LIPITOR) 20 MG tablet Take 1 tablet (20 mg total) by mouth daily (with dinner) 30 tablet 5    blood pressure monitor Use to monitor home BP. Dx = hypertension 1 each 0    aspirin 81 MG tablet Take 81 mg by mouth daily      benazepril (LOTENSIN) 10 MG tablet TAKE 1 TABLET BY MOUTH EVERY DAY (Patient not taking: Reported on 08/02/2014) 30 tablet 5     No current facility-administered medications on file prior to visit.     Electronic record reviewed at patient visit and see  electronic record to confirm most up-to-date list    Past Medical History   Diagnosis Date    Neuromuscular disorder      elbows, shoulders    Chest pain, unspecified     Unspecified essential hypertension 06/02/2013    Depression 05/23/2014         No past surgical history on file.  Electronic record reviewed at patient visit and see electronic record to confirm most up-to-date list      General exam:   BMI:  29.29  Blood pressure 136/76, pulse 79, height 1.77 m (5' 9.69"), weight 91.763 kg (202 lb 4.8 oz).,      Pain    08/02/14 1438   PainSc:   4   PainLoc: Knee       Left knee  exam: Large hematoma over the anterior medial tibia.  Skin is intact.  No erythema.  This is mildly fluctuant but firm at the same time.  Subtle diffuse ankle swelling, which he states is significantly improved.    Some medial joint line tenderness with palpation.  Mild medial McMurray's.  He is able to fully extend.  Flexion to 120 degrees.  Mild patellofemoral crepitus    Imaging left  knee xrays taken previously : shows mild arthritic changes but overall good joint space preservation.      Plan:Left knee with likely  medial meniscus tearing causing medial pain.    Recommend left knee MRI to assess medial meniscus.    Separate issue of hematoma medial proximal tibia which will likely take several months to fully resolve.  Associated lower leg swelling from hematoma resolution which is improving as well.  Continue ice, ankle and leg elevation if swelling.      Patient will remain out of work until follow-up with left knee MRI.    **This transcription was done using voice recognition.

## 2014-08-15 ENCOUNTER — Ambulatory Visit: Payer: Self-pay | Admitting: Sports Medicine

## 2014-08-15 ENCOUNTER — Encounter: Payer: Self-pay | Admitting: Sports Medicine

## 2014-08-15 VITALS — BP 132/76 | Ht 70.0 in | Wt 200.0 lb

## 2014-08-15 DIAGNOSIS — M1712 Unilateral primary osteoarthritis, left knee: Secondary | ICD-10-CM

## 2014-08-15 NOTE — Progress Notes (Signed)
Chief complaint: Patient is following up for MRI results for ongoing left medial knee pain    History of present illness: Patient continues with left medial knee pain.  Primary complaint of increased anterior medial pain with getting up and down.  Especially with stepping up on a stair or getting out of a chair.    He also notes even prior to his flexed knee fall about 5-6 weeks ago with increased anterior medial pain that he was having some anterior medial pain with doing stairs and shoveling for example.    His separate issue of left medial proximal tibia hematoma continues to be improving/resolving.  Swelling continues to go down.  Mild associated ankle stiffness but not significantly painful.    Left Knee exam: Mild patellar crepitus.  Mild patellar grind test.  Some mild anterior medial joint line tenderness.    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  Mild trochlear changes as well.  Medial meniscus does have some degenerative tearing but no frank or displaced tearing.  Mild degenerative changes medial compartment as well.    Fluid over the anterior medial knee consistent with hematoma just as history/per his report he had direct blow to the lower leg causing significant ecchymosis, swelling in the short term.  This continues to resolve.  Skin is intact to exam.  No signs of infection.    MRI was reviewed with patient and Dr. Noland Fordyce as well today.  He agrees with following plan.    Plan: Left knee with primarily arthritis aggravation likely of his patella.  He did have a flexed knee fall which likely aggravated this further.   Less likely some medial meniscus tearing causing medial pain as well.  Definitely from meniscal standpoint, we would not recommend aggressively pursuing arthroscopy given his arthritis and this is not significant tearing on MRI.    We will trial a cortisone injection.  He has had good success with previous cortisone injections in  his elbows and his shoulder in the past.    Discussed continued stretching.  Gentle strengthening which we reviewed exercises.  Keeping his weight down long-term will help manage his patellar arthritic symptoms in particular.    If ongoing pain he may be a candidate for further physical therapy.  Prescription written for physical therapy at [facility] which he can do at his convenience.      Time out performed.  2 patient identifiers.  Left Knee marked prior to injection.    Procedure note left Knee: The left knee was prepped in the standard fashion with Betadine followed by alcohol.  Through an anteromedial portal the knee was injected with 4 ml of 1% Lidocaine and 3 ml (12 mg) of Decadron.      The procedure was well tolerated.      Patient can return to his normal job at [employer] on Monday.  Paperwork filled out for this.  He will modify climbing as needed in the short term.  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    I've written a prescription for physical therapy.  He can do this at our [place] office especially if not responding well to cortisone injection.    If ongoing medial pain there may be role for consideration of arthroscopy, but discussed his meniscus does not have significant/frank tearing.  No displaced tearing particular.  Given his arthritis would recommend treating that conservatively for a while before considering  any further intervention with arthroscopy.      Regarding his hematoma, this is resolving nicely.  Discussed this likely will take several months to fully resolve.  He can do some gentle massage, heat in the short term.  Continue anti-inflammatories.      Also advise for stretching of his lower leg given the associated swelling and aggravation of the structures below this/lower leg, which continue to be stiff.          **This transcription was done using voice recognition.

## 2014-09-02 ENCOUNTER — Other Ambulatory Visit: Payer: Self-pay | Admitting: Primary Care

## 2014-09-08 ENCOUNTER — Ambulatory Visit
Admit: 2014-09-08 | Discharge: 2014-09-08 | Disposition: A | Discharge status: Home / Self Care | Payer: Self-pay | Source: Ambulatory Visit | Attending: Primary Care | Admitting: Primary Care

## 2014-09-08 DIAGNOSIS — I10 Essential (primary) hypertension: Secondary | ICD-10-CM

## 2014-09-08 DIAGNOSIS — E785 Hyperlipidemia, unspecified: Secondary | ICD-10-CM

## 2014-09-08 LAB — BASIC METABOLIC PANEL
Anion Gap: 14 (ref 7–16)
CO2: 24 mmol/L (ref 20–28)
Calcium: 9.2 mg/dL (ref 8.6–10.2)
Chloride: 103 mmol/L (ref 96–108)
Creatinine: 1.13 mg/dL (ref 0.67–1.17)
GFR,Black: 84 *
GFR,Caucasian: 73 *
Glucose: 147 mg/dL — ABNORMAL HIGH (ref 60–99)
Lab: 17 mg/dL (ref 6–20)
Potassium: 4.4 mmol/L (ref 3.3–5.1)
Sodium: 141 mmol/L (ref 133–145)

## 2014-09-08 LAB — LIPID PANEL
Chol/HDL Ratio: 3.2
Cholesterol: 152 mg/dL
HDL: 48 mg/dL
LDL Calculated: 72 mg/dL
Non HDL Cholesterol: 104 mg/dL
Triglycerides: 158 mg/dL — AB

## 2014-09-08 LAB — HEMOGLOBIN A1C: Hemoglobin A1C: 5.7 % (ref 4.0–6.0)

## 2014-09-11 ENCOUNTER — Ambulatory Visit: Payer: Self-pay | Admitting: Primary Care

## 2014-09-11 ENCOUNTER — Encounter: Payer: Self-pay | Admitting: Primary Care

## 2014-09-11 VITALS — BP 120/84 | HR 66 | Ht 70.0 in | Wt 203.4 lb

## 2014-09-11 DIAGNOSIS — R7301 Impaired fasting glucose: Secondary | ICD-10-CM

## 2014-09-11 DIAGNOSIS — Z23 Encounter for immunization: Secondary | ICD-10-CM

## 2014-09-11 NOTE — Progress Notes (Signed)
Lance Peters is a 54 y.o. male   09/11/2014    Chief Complaint   Patient presents with    Diabetes    Hypertension       HPI:  Lance Peters arrived for a scheduled follow up appointment.    Following up on hypertension, impaired fasting glucose.  He's taking his medications as prescribed, not missing any doses.  No side effects.  Reviewed labs, hemoglobin A1c is on target.  Blood pressure is on target today.  Cholesterol numbers look great.    He fell while ago, injured his knee.  He's been orthopedics, where he received 8 cortisone injection in the left knee, which she says the knee quite a bit.  He is considering going back to orthopedics to request similar injection in the right knee.    He's not sure when his last tetanus booster was, he thinks maybe 14 years ago when he adopted his daughter from [place].    Patient Active Problem List   Diagnosis Code    Knee pain M25.569    Shoulder pain M25.519    Impaired fasting glucose R73.01    Chest pain R07.9    Essential hypertension I10    Hyperlipidemia E78.5    Arthritis M19.90    Depression F32.9       Allergies: Penicillins    Medication list reviewed, no changes made  Allergy list reviewed  Problem list reviewed    OBJECTIVE:  Vitals:    09/11/14 1526   BP: 120/84   Pulse: 66   Weight: 92.3 kg (203 lb 6.4 oz)   Height: 1.778 m (5\' 10" )     Alert, oriented, no distress, appears well  No further swelling at the knees, or lower extremities    ASSESSMENT/PLAN:  Impaired fasting glucose-hemoglobin A1c is on target.  Encouraged him to continue watching his diet, exercise daily, maybe add some strength training to increase muscle mass.  Recheck labs, follow-up in 6 months    Hypertension-blood pressure is on target, tolerating medication, continue diet, exercise, weight management    Hyperlipidemia-cholesterol numbers on target, tolerating statin, no change    Arthritis of the knees-seeing orthopedics.  No new recommendations.    Medication instructions were  discussed with patient (and advocate). Advised that written instructions will also be provided by the pharmacy. Anticipated side effects discussed. Encouraged patient to call with any questions or concerns.

## 2014-09-19 ENCOUNTER — Ambulatory Visit: Payer: Self-pay | Admitting: Sports Medicine

## 2014-09-19 ENCOUNTER — Encounter: Payer: Self-pay | Admitting: Sports Medicine

## 2014-09-19 VITALS — BP 147/89 | HR 56 | Ht 70.0 in | Wt 208.0 lb

## 2014-09-19 DIAGNOSIS — M170 Bilateral primary osteoarthritis of knee: Secondary | ICD-10-CM | POA: Insufficient documentation

## 2014-09-19 NOTE — Progress Notes (Signed)
Chief complaint: Scheduled followup for left knee hematoma with aggravation of patellar arthritis from flexed knee fall   Now with increased right anterior knee pain    History of present illness: It is been a month since we last saw patient regarding his left knee.  Previous cortisone injection left knee was very helpful.  He definitely has less symptoms in his left knee.  Still ongoing anterior medial discomfort primarily worsened with flexion loading.  Indicates pain along the medial border of the patella and anterior medial joint line.  His left knee is considerably better.  He feels like his hematoma has resolved on the left knee.    Primary complaint is actually his opposite right knee.  Opposite right knee with similar anteromedial complaints.  Increased pain with stairs and flexion loading as on the left knee.  He is interested in trying a cortisone injection in his right knee given how well at work in his opposite left knee last visit.    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    Allergies   Allergen Reactions    Penicillins      rash     Past Medical History   Diagnosis Date    Chest pain, unspecified     Depression 05/23/2014    Neuromuscular disorder      elbows, shoulders    Unspecified essential hypertension 06/02/2013       Left Knee exam : Hematoma resolved.  No effusion.  Full extension.  Mild patellofemoral crepitus.  No distinct medial joint line pain with McMurray's medially today.  No groin pain with hip exam.    Right knee exam: Mild patellofemoral crepitus.  No knee effusion.  Mild patellar grind test.  Mild anteromedial joint line tenderness.    Flexion both knees to 120.  No groin pain with hip exam bilaterally.    Imaging  left Knee MRI previously : Shows considerable medial and medial patellar facet patellofemoral arthritis.  Mild degenerative meniscus tearing.  Significant hematoma left knee consistent with flexed knee fall    Plan: Left knee arthritis  aggravation and hematoma resolving nicely.  Responded well to cortisone injection.  Discussed with him he does have mild meniscus tearing but I think his patellofemoral joint is the primary culprit, especially given his complaints of increased pain with flexion loading in stairs along the anterior medial aspect of his knee.    Opposite right knee with anterior medial discomfort and pain with flexion loading consistent with patellofemoral arthritis just like his left knee.  Likely his right knee looks very similar to his left with similar wear pattern given his age.  He would like to trial a cortisone injection his right knee given how well at work in his opposite knee.  Agreeable to that today after discussion.      Time out performed.  2 patient identifiers.  Right Knee marked prior to injection.    Procedure note right Knee: The right knee was prepped in the standard fashion with Betadine followed by alcohol.  Through an anteromedial portal the knee was injected with 4 ml of 1% Lidocaine and 3 ml (12 mg) of Decadron.      Discussed working on straight leg raises.  Working on flexibility including calf, hamstring, hip stretches.  We reviewed appropriate therapeutic exercises including working on flexibility and gentle strengthening not causing pain    The procedure was well tolerated.  Follow-up as needed.  If he has ongoing pain he would be a candidate for potential repeat cortisone injections for flareups.  Potentially longer term if he has ongoing pain he may be a candidate for Orthovisc injections.    He will continue on meloxicam with direction of his doctor.  Discussed lowest dose that is helpful would be recommended.    Follow-up as needed.  If he has a flareup or is worsening he will let us know for reevaluation at any time      **This transcription was done using voice recognition.

## 2014-10-03 ENCOUNTER — Ambulatory Visit: Payer: Self-pay | Admitting: Internal Medicine

## 2014-10-03 VITALS — BP 130/84 | HR 72 | Temp 98.8°F | Ht 70.0 in | Wt 203.0 lb

## 2014-10-03 DIAGNOSIS — J019 Acute sinusitis, unspecified: Secondary | ICD-10-CM

## 2014-10-03 DIAGNOSIS — R059 Cough, unspecified: Secondary | ICD-10-CM

## 2014-10-03 MED ORDER — AZITHROMYCIN 250 MG PO TABS *I*
ORAL_TABLET | ORAL | 0 refills | Status: AC
Start: 2014-10-03 — End: 2014-10-08

## 2014-10-03 NOTE — Progress Notes (Signed)
Chief Complaint   Patient presents with    Cough    Sinusitis       HPI:  Garo Heidelberg comes in for allergies have been flaring for a while now, but started getting significantly worse about 5 days ago.  Started with nasal congestion and subsequently developed significant sinus pressure and postnasal drip.  Then developed a cough productive of thick yellow mucus.  He is starting to feel some congestion in the chest as well.  No chest pain or shortness of breath.  Subjective fevers and chills.  Ears feel okay.  Throat is sore.  He is taking Zyrtec-D which does give him some relief.  Each day feels a little bit worse than the day before and concerned as his daughter is getting married this weekend.  He has had pneumonia a couple of times in the past and doesn't want to go down this road again.    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 Authorizing Provider   atorvastatin (LIPITOR) 20 MG tablet TAKE 1 TABLET BY MOUTH EVERY DAY WITH DINNER 09/04/14  Yes Penni Bombard, MD   amLODIPine (NORVASC) 10 MG tablet TAKE 1 TABLET BY MOUTH EVERY DAY 07/03/14  Yes Briant Cedar, DO   sertraline (ZOLOFT) 50 MG tablet Take 1 tablet (50 mg total) by mouth daily 04/21/14 10/18/14 Yes Penni Bombard, MD   meloxicam (MOBIC) 15 MG tablet Take 1 tablet (15 mg total) by mouth daily   Take with food. 04/21/14  Yes Penni Bombard, MD   benazepril (LOTENSIN) 10 MG tablet 20 mg daily    02/18/14  Yes [provider]   aspirin 81 MG tablet Take 81 mg by mouth daily   Yes [provider]   azithromycin (ZITHROMAX) 250 MG tablet  Take 2 tablets (500 mg) on day 1, followed by 1 tablet (250 mg) on days 2 through 5. 10/03/14 10/08/14  Collier Bullock, PA      Medications reviewed with patient, reconciled and no changes made today.    ALLERGIES:  Penicillins   Allergies reviewed with patient and confirmed today.    EXAMINATION:  Vitals:    10/03/14 1527   BP: 130/84   Pulse: 72   Temp: 37.1 C (98.8 F)   TempSrc: Tympanic   Weight: 92.1 kg (203 lb)   Height: 1.778 m (5\' 10" )                                 Body mass index is 29.13 kg/(m^2).  BP Readings from Last 4 Encounters:   10/03/14 130/84   09/19/14 147/89   09/11/14 120/84   08/15/14 132/76      Wt Readings from Last 4 Encounters:   10/03/14 92.1 kg (203 lb)   09/19/14 94.3 kg (208 lb)   09/11/14 92.3 kg (203 lb 6.4 oz)   08/15/14 90.7 kg (200 lb)  Physical examination:  General: Well-appearing and well-nourished, nontoxic, no acute distress.  HEENT: Normocephalic, Atraumatic. Normal Conjunctiva. Nose with + congestion and erythema. Frontal and maxillary sinus tenderness. Mucous membranes are moist.  The uvula is mid line, the pharynx is without swelling erythema exudate. + thick yellowpost nasal drip.  There is no evidence of PTA.  Bilateral TMs are clear, there is no external canal swelling, pinna and mastoids are normal.  Neck: Supple, trachea midline.  No cervical or supraclavicular lymphadenopathy.  Heart: Regular rate and rhythm no murmurs, rubs or gallops.  Lungs: Breathing is unlabored.  No respiratory distress, respiratory rate 16.  Lungs are clear to auscultation, no wheezes rales or rhonchi.  Extremities: Without swelling.  Neurological: Alert and oriented x3 no focal neurological deficits noted.      ASSESSMENT/PLAN:  1. Acute sinusitis  2. Cough    Given quality and duration of symptoms, do believe that antibiotics are warranted.  I will start the patient on azithromycin which he has tolerated well in the past, use and adverse effects reviewed.  Also recommended  mucinex. Counseled on supportive measures and symptoms that should prompt a call as outlined below. Patient agreeable with this plan.     Patient Instructions   Drink plenty of fluids.   Use Mucinex extended release 600-1200mg  twice a day (can get generic) to help thin the secretions.  Start the azithromycin.  Continue the zyrtec.     Call for fevers over 101, worsening symptoms, worsening breathing, or if there is no improvement after 5-7 days.

## 2014-10-03 NOTE — Patient Instructions (Signed)
Drink plenty of fluids.   Use Mucinex extended release 600-1200mg  twice a day (can get generic) to help thin the secretions.  Start the azithromycin.  Continue the zyrtec.     Call for fevers over 101, worsening symptoms, worsening breathing, or if there is no improvement after 5-7 days.

## 2014-10-13 ENCOUNTER — Other Ambulatory Visit: Payer: Self-pay | Admitting: Primary Care

## 2014-10-16 ENCOUNTER — Other Ambulatory Visit: Payer: Self-pay | Admitting: Primary Care

## 2014-10-16 MED ORDER — SERTRALINE HCL 50 MG PO TABS *I*
50.0000 mg | ORAL_TABLET | Freq: Every day | ORAL | 0 refills | Status: DC
Start: 2014-10-16 — End: 2014-11-04

## 2014-11-04 ENCOUNTER — Other Ambulatory Visit: Payer: Self-pay | Admitting: Primary Care

## 2014-12-07 ENCOUNTER — Other Ambulatory Visit: Payer: Self-pay | Admitting: Primary Care

## 2014-12-19 ENCOUNTER — Other Ambulatory Visit: Payer: Self-pay | Admitting: Primary Care

## 2014-12-30 ENCOUNTER — Other Ambulatory Visit: Payer: Self-pay | Admitting: Primary Care

## 2015-03-28 ENCOUNTER — Ambulatory Visit
Admission: RE | Admit: 2015-03-28 | Discharge: 2015-03-28 | Disposition: A | Discharge status: Home / Self Care | Payer: Self-pay | Source: Ambulatory Visit | Attending: Primary Care | Admitting: Primary Care

## 2015-03-28 DIAGNOSIS — R7301 Impaired fasting glucose: Secondary | ICD-10-CM

## 2015-03-28 LAB — BASIC METABOLIC PANEL
Anion Gap: 12 (ref 7–16)
CO2: 26 mmol/L (ref 20–28)
Calcium: 9 mg/dL (ref 8.6–10.2)
Chloride: 102 mmol/L (ref 96–108)
Creatinine: 1.14 mg/dL (ref 0.67–1.17)
GFR,Black: 83 *
GFR,Caucasian: 72 *
Glucose: 134 mg/dL — ABNORMAL HIGH (ref 60–99)
Lab: 21 mg/dL — ABNORMAL HIGH (ref 6–20)
Potassium: 4.5 mmol/L (ref 3.3–5.1)
Sodium: 140 mmol/L (ref 133–145)

## 2015-03-28 LAB — HEMOGLOBIN A1C: Hemoglobin A1C: 6 % (ref 4.0–6.0)

## 2015-04-04 ENCOUNTER — Ambulatory Visit: Payer: Self-pay | Admitting: Primary Care

## 2015-04-04 ENCOUNTER — Encounter: Payer: Self-pay | Admitting: Primary Care

## 2015-04-04 VITALS — BP 130/80 | HR 64 | Ht 70.0 in | Wt 213.8 lb

## 2015-04-04 DIAGNOSIS — M2550 Pain in unspecified joint: Secondary | ICD-10-CM

## 2015-04-04 DIAGNOSIS — R7301 Impaired fasting glucose: Secondary | ICD-10-CM

## 2015-04-04 DIAGNOSIS — E785 Hyperlipidemia, unspecified: Secondary | ICD-10-CM

## 2015-04-04 DIAGNOSIS — L989 Disorder of the skin and subcutaneous tissue, unspecified: Secondary | ICD-10-CM

## 2015-04-04 DIAGNOSIS — I10 Essential (primary) hypertension: Secondary | ICD-10-CM

## 2015-04-04 LAB — SEDIMENTATION RATE, AUTOMATED: Sedimentation Rate: <=1 mm/hr (ref 0–20)

## 2015-04-04 LAB — CRP: CRP: <1 mg/L (ref 0–10)

## 2015-04-04 MED ORDER — PIROXICAM 20 MG PO CAPS *I*
20.0000 mg | ORAL_CAPSULE | Freq: Every day | ORAL | 5 refills | Status: DC
Start: 2015-04-04 — End: 2015-09-12

## 2015-04-04 NOTE — Progress Notes (Signed)
Lance Peters is a 54 y.o. male   04/04/2015    Chief Complaint   Patient presents with    Diabetes    Results     review blood work       HPI:  Lance Peters arrived for a scheduled follow up appointment.    Following up on his impaired fasting glucose.  Also followed for hypertension, hyperlipidemia.    Recent labs reviewed.  Hemoglobin A1c remains on target, slightly higher than it was previously.  He also acknowledges that he's gained about 10 pounds since the summer.  He is working on correcting that.    He is taking his medications for low pressure and cholesterol, every day, not missing any doses, no side effects.    He is most troubled by arthritis symptoms.  Has pretty significant pain and stiffness in the MCP and PIP joints of the hands, also the elbows, shoulders, knees.  Using meloxicam without much benefit.  He says he wakes up feeling extremely stiff, feels stiff pretty much throughout the day.  His mother has had some sort of arthritis for many years.    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  Speaking and breathing comfortably.  No visible deformity of the joints.  There may be some mild degree of synovitis in the MCP and PIP joints of the hands.    ASSESSMENT/PLAN:  Impaired fasting glucose, sugars remain borderline.  He's going to continue efforts at diet, exercise, weight loss.  We will check again in 6 months.    Hypertension-blood  pressure is on target according to current guidelines, continue current medications, see above regarding diet, exercise, weight loss.    Hyperlipidemia-appropriate statin dose, which she is tolerating, no changes    Polyarthralgias-most likely osteoarthritis, but given the prolonged stiffness I think blood testing for inflammatory arthritic condition is reasonable.  Blood work ordered.  Meloxicam ineffective, we'll have him switch to piroxicam and get back to me after month regarding effect.    Medication instructions were discussed with patient (and advocate). Advised that written instructions will also be provided by the pharmacy. Anticipated side effects discussed. Encouraged patient to call with any questions or concerns.

## 2015-04-05 LAB — ANTINUCLEAR ANTIBODY SCREEN: ANA Screen: NEGATIVE

## 2015-04-05 LAB — RHEUMATOID FACTOR,SCREEN: Rheumatoid Factor: <10 IU/mL

## 2015-04-05 LAB — CYCLIC CITRULLINATED PEPTIDE: Cyclic Citrullin Peptide Ab: 0 U (ref 0–19)

## 2015-04-06 ENCOUNTER — Encounter: Payer: Self-pay | Admitting: Primary Care

## 2015-05-06 ENCOUNTER — Other Ambulatory Visit: Payer: Self-pay | Admitting: Primary Care

## 2015-05-07 MED ORDER — SERTRALINE HCL 50 MG PO TABS *I*
50.0000 mg | ORAL_TABLET | Freq: Every day | ORAL | 5 refills | Status: DC
Start: 2015-05-07 — End: 2015-09-12

## 2015-06-11 ENCOUNTER — Encounter: Payer: Self-pay | Admitting: Internal Medicine

## 2015-06-11 ENCOUNTER — Ambulatory Visit: Payer: Self-pay | Admitting: Internal Medicine

## 2015-06-11 VITALS — BP 146/94 | HR 67 | Temp 98.1°F | Ht 70.0 in | Wt 217.0 lb

## 2015-06-11 DIAGNOSIS — J019 Acute sinusitis, unspecified: Secondary | ICD-10-CM

## 2015-06-11 MED ORDER — AZITHROMYCIN 250 MG PO TABS *I*
ORAL_TABLET | ORAL | 0 refills | Status: DC
Start: 2015-06-11 — End: 2016-04-10

## 2015-06-11 NOTE — Progress Notes (Signed)
Chief Complaint   Patient presents with    URI       HPI:  Lance Peters comes in for URI for over a week now. Not getting any better. Lots of sinus pain and pressures, constant sinus headache. Ears feel full, sore throat in the AM. Taking advil and zyrtec. Notes that advil tends to raise his BP. Thick brown nasal discharge, sometimes green, it has been bleeding. Coughing, nonproductive but does hurt the chest to cough. No chest pain or shortness of breath.     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        CURRENT MEDICATIONS:    Current Outpatient Prescriptions:     sertraline (ZOLOFT) 50 MG tablet, Take 1 tablet (50 mg total) by mouth daily, Disp: 30 tablet, Rfl: 5    piroxicam (FELDENE) 20 MG capsule, Take 1 capsule (20 mg total) by mouth daily, Disp: 30 capsule, Rfl: 5    amLODIPine (NORVASC) 10 MG tablet, TAKE 1 TABLET BY MOUTH EVERY DAY, Disp: 30 tablet, Rfl: 11    atorvastatin (LIPITOR) 20 MG tablet, TAKE 1 TABLET BY MOUTH EVERY DAY WITH DINNER, Disp: 30 tablet, Rfl: 11    benazepril (LOTENSIN) 10 MG tablet, 20 mg daily   , Disp: , Rfl:     aspirin 81 MG tablet, Take 81 mg by mouth daily, Disp: , Rfl:     azithromycin (ZITHROMAX) 250 MG tablet, Take 2 tablets (500 mg) on day 1, followed by 1 tablet (250 mg) on days 2 through 5., Disp: 6 tablet, Rfl: 0  Medications reviewed with patient, reconciled and no changes made today.    ALLERGIES:  Penicillins   Allergies reviewed with patient and confirmed today.    EXAMINATION:  Vitals:    06/11/15 1508 06/11/15 1520   BP: (!) 150/100 (!) 146/94   Pulse: 67    Temp: 36.7 C (98.1 F)    TempSrc: Tympanic    SpO2: 96%    Weight: 98.4 kg (217 lb)    Height: 1.778 m (5\' 10" )                                   Body mass index is 31.14 kg/(m^2).  BP Readings from Last 4 Encounters:   06/11/15 (!) 146/94   04/04/15 130/80   10/03/14 130/84   09/19/14 147/89      Wt Readings from Last 4 Encounters:   06/11/15 98.4 kg (217 lb)   04/04/15 97 kg (213 lb 12.8 oz)   10/03/14 92.1 kg (203 lb)   09/19/14 94.3 kg (208 lb)         Physical examination:  General: Well-appearing and well-nourished, nontoxic, no acute distress.  HEENT: Normocephalic, Atraumatic. Normal Conjunctiva. Nose with + congestion and erythema. Frontal sinuses are tender to palpation. Mucous membranes are moist.  The uvula is mid line, the pharynx is without swelling erythema exudate.  There is no evidence of PTA.  Left TM is clear + effusion on the right, there is no external canal swelling, pinna and mastoids are normal.  Neck: Supple, trachea midline.  No cervical or supraclavicular lymphadenopathy.  Heart: Regular rate and rhythm no murmurs, rubs or gallops.  Lungs: Breathing is unlabored.  No respiratory distress, respiratory rate 16.  Lungs are clear to auscultation, no wheezes rales or rhonchi.  Extremities: Without swelling.  Neurological: Alert and oriented x3 no focal neurological deficits noted.      ASSESSMENT/PLAN:  Acute sinusitis: Given quality and duration of symptoms, do believe that antibiotics are warranted.  I will start the patient on azithromycin which typically works best for him, use and adverse effects reviewed.  Counseled on supportive measures and symptoms that should prompt a call as outlined below. Patient agreeable with this plan.     Note his BP is elevated, he states this is usually the case when he is on advil. Advised he should not be doing both advil and piroxicam. 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 Kelle Darting in 3 months and recheck BP at that time. Patient agreeable.    Patient Instructions   Drink plenty of fluids.   Can use Mucinex extended release  600-1200mg  twice a day (can get generic) to help thin the secretions.  Start the azithromycin.   Can continue zyrtec.   Can do advil or piroxicam but don't do both     Call for fevers over 101, worsening symptoms, worsening breathing, or if there is no improvement after 3-5 days.

## 2015-06-11 NOTE — Patient Instructions (Addendum)
Drink plenty of fluids.   Can use Mucinex extended release 600-1200mg  twice a day (can get generic) to help thin the secretions.  Start the azithromycin.   Can continue zyrtec.   Can do advil or piroxicam but don't do both     Call for fevers over 101, worsening symptoms, worsening breathing, or if there is no improvement after 3-5 days.

## 2015-07-17 ENCOUNTER — Other Ambulatory Visit: Payer: Self-pay | Admitting: Primary Care

## 2015-07-27 ENCOUNTER — Telehealth: Payer: Self-pay

## 2015-07-27 NOTE — Telephone Encounter (Signed)
Mr. Lance Peters is calling to schedule an appointment. The patient would like to be seen for 3 year colonoscopy. Please call the patient to schedule at [phone].    Patient answered NO to all screening questions.

## 2015-07-31 NOTE — Telephone Encounter (Signed)
Spoke with patient, scheduled 3 year COD on 10/12/2015 at 1:45 PM with Dr. Scherrie Bateman Mailed prep.

## 2015-07-31 NOTE — Telephone Encounter (Signed)
If patient needs to reschedule ( as he is going to check on transportation and call back if needed) he can be scheduled for any COD after 09/03/12 with any provider at [facility]

## 2015-08-14 ENCOUNTER — Other Ambulatory Visit: Payer: Self-pay | Admitting: Primary Care

## 2015-09-10 ENCOUNTER — Other Ambulatory Visit
Admission: RE | Admit: 2015-09-10 | Discharge: 2015-09-10 | Disposition: A | Discharge status: Home / Self Care | Payer: Self-pay | Source: Ambulatory Visit | Attending: Primary Care | Admitting: Primary Care

## 2015-09-10 DIAGNOSIS — L989 Disorder of the skin and subcutaneous tissue, unspecified: Secondary | ICD-10-CM

## 2015-09-10 LAB — BASIC METABOLIC PANEL
Anion Gap: 12 (ref 7–16)
CO2: 25 mmol/L (ref 20–28)
Calcium: 9.5 mg/dL (ref 8.6–10.2)
Chloride: 105 mmol/L (ref 96–108)
Creatinine: 1.05 mg/dL (ref 0.67–1.17)
GFR,Black: 91 *
GFR,Caucasian: 79 *
Glucose: 139 mg/dL — ABNORMAL HIGH (ref 60–99)
Lab: 22 mg/dL — ABNORMAL HIGH (ref 6–20)
Potassium: 4.8 mmol/L (ref 3.3–5.1)
Sodium: 142 mmol/L (ref 133–145)

## 2015-09-10 LAB — HEMOGLOBIN A1C: Hemoglobin A1C: 6.4 % — ABNORMAL HIGH (ref 4.0–6.0)

## 2015-09-12 ENCOUNTER — Encounter: Payer: Self-pay | Admitting: Primary Care

## 2015-09-12 ENCOUNTER — Ambulatory Visit: Payer: Self-pay | Admitting: Primary Care

## 2015-09-12 ENCOUNTER — Telehealth: Payer: Self-pay | Admitting: Primary Care

## 2015-09-12 VITALS — BP 136/86 | HR 68 | Ht 70.0 in | Wt 209.8 lb

## 2015-09-12 DIAGNOSIS — R7301 Impaired fasting glucose: Secondary | ICD-10-CM

## 2015-09-12 DIAGNOSIS — M25549 Pain in joints of unspecified hand: Secondary | ICD-10-CM

## 2015-09-12 DIAGNOSIS — I10 Essential (primary) hypertension: Secondary | ICD-10-CM

## 2015-09-12 DIAGNOSIS — F32A Depression, unspecified: Secondary | ICD-10-CM

## 2015-09-12 DIAGNOSIS — E785 Hyperlipidemia, unspecified: Secondary | ICD-10-CM

## 2015-09-12 MED ORDER — SERTRALINE HCL 100 MG PO TABS *I*
100.0000 mg | ORAL_TABLET | Freq: Every day | ORAL | 5 refills | Status: DC
Start: 2015-09-12 — End: 2016-02-29

## 2015-09-12 MED ORDER — PIROXICAM 20 MG PO CAPS *I*
20.0000 mg | ORAL_CAPSULE | Freq: Every day | ORAL | 5 refills | Status: DC
Start: 2015-09-12 — End: 2017-01-15

## 2015-09-12 NOTE — Telephone Encounter (Signed)
Patient came to the office for scheduled appointment. Patient stated that he has an appointment for Colonoscopy on 10/12/15 at [facility]

## 2015-09-12 NOTE — Progress Notes (Signed)
Lance Peters is a 55 y.o. male   09/12/2015    Chief Complaint   Patient presents with    Impaired fasting glucose       HPI:  Lance Peters arrived for a scheduled follow up appointment.    Here for follow-up of impaired fasting glucose, hypertension, hyperlipidemia.  Recent lab testing showed hemoglobin A1c has increased a little bit further, now 6.4.  He is disappointed.  He has a fairly physical job, has not been doing any exercise otherwise.  He says that he's probably be needing more potatoes and pasta than he should be, and is resolving to do a better job watching his diet.  We also talked about alcohol.  He has a few beers on Fridays and Saturdays.    Also followed for hypertension.  Taking all of his medicines as prescribed, missing any doses.  Similar 4 hyperlipidemia.    Again, he is troubled by arthritis symptoms.  He is particularly troubled now with right hand symptoms, has triggering in the middle and ring fingers.  Very painful.    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       Allergies: Penicillins    Medication list reviewed, no changes made  Current Outpatient Prescriptions   Medication Sig    atorvastatin (LIPITOR) 20 MG tablet TAKE 1 TABLET BY MOUTH EVERY DAY WITH DINNER    benazepril (LOTENSIN) 10 MG tablet TAKE 1 TABLET BY MOUTH EVERY DAY    sertraline (ZOLOFT) 50 MG tablet Take 1 tablet (50 mg total) by mouth daily    piroxicam (FELDENE) 20 MG capsule Take 1 capsule (20 mg total) by mouth daily    amLODIPine (NORVASC) 10 MG tablet TAKE 1 TABLET BY MOUTH EVERY DAY    aspirin 81 MG tablet Take 81 mg by mouth daily     No current facility-administered medications for this visit.         Allergy list reviewed  Problem list  reviewed    OBJECTIVE:  Vitals:    09/12/15 1516   BP: 136/86   Pulse: 68   Weight: 95.2 kg (209 lb 12.8 oz)   Height: 1.778 m (5\' 10" )     Alert, oriented, no distress, appears well.  Hands are without visible deformity.  There is some triggering activity in the right ring and middle fingers.    Recent Results (from the past 72 hour(s))   Basic metabolic panel    Collection Time: 09/10/15  8:08 AM   Result Value Ref Range    Glucose 139 (H) 60 - 99 mg/dL    Sodium 142 133 - 145 mmol/L    Potassium 4.8 3.3 - 5.1 mmol/L    Chloride 105 96 - 108 mmol/L    CO2 25 20 - 28 mmol/L    Anion Gap 12 7 - 16    UN 22 (H) 6 - 20 mg/dL    Creatinine 1.05 0.67 - 1.17 mg/dL    GFR,Caucasian 79 *    GFR,Black 91 *    Calcium 9.5 8.6 - 10.2 mg/dL   Hemoglobin A1c    Collection Time: 09/10/15  8:08 AM  Result Value Ref Range    Hemoglobin A1C 6.4 (H) 4.0 - 6.0 %       ASSESSMENT/PLAN:  Impaired fasting glucose-little bit worse, close to a diagnoses of diabetes.  He's going to redouble his efforts at diet and exercise, weight maintenance.  Maybe decrease alcohol use.  We will recheck labs in 3 months.  If everything is better than we will hold off on follow-up, repeat labs and follow-up in person in 6 months.  If three-month labs show worsening glycemic control, then we will consider having him back in so we can discuss medicines.  We briefly discussed at the first medicine choice would be metformin.  He has seen commercials for other new or medicines on TV, but he probably would not need those anytime soon.  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    Hypertension-blood pressure is on target, continue current medicines, diet, exercise as above.    Hyperlipidemia-on appropriate statin dosing, cholesterol numbers on target, continue.    Arthritis in the hand, with triggering, referred to the orthopedic hand service to discuss treatment options.    Depression-partially controlled  on current sertraline dose, he would like to increase the dose which I think is reasonable.  Increase to 100 mg, and reassess at next appointment.  Call sooner for problems.  He is not having side effects with the current dose, I don't expect side effects to start with the slightly higher dose, and he is not suicidal.    Medication instructions were discussed with patient (and advocate). Advised that written instructions will also be provided by the pharmacy. Anticipated side effects discussed. Encouraged patient to call with any questions or concerns.

## 2015-09-24 ENCOUNTER — Other Ambulatory Visit: Payer: Self-pay | Admitting: Primary Care

## 2015-10-01 ENCOUNTER — Telehealth: Payer: Self-pay

## 2015-10-01 NOTE — Telephone Encounter (Signed)
Left message regarding October 12, 2015 appointment. Patient must call back and confirm date/time of appointment, location; directions received, and have transportation. Patient also must confirm he/she is not a diabetic and/or on blood thinning medications. Instructions sent through [place].

## 2015-10-02 ENCOUNTER — Ambulatory Visit: Payer: Self-pay | Admitting: Orthopedic Surgery

## 2015-10-02 ENCOUNTER — Encounter: Payer: Self-pay | Admitting: Orthopedic Surgery

## 2015-10-02 VITALS — BP 139/87 | HR 54 | Ht 70.0 in | Wt 200.0 lb

## 2015-10-02 DIAGNOSIS — G5603 Carpal tunnel syndrome, bilateral upper limbs: Secondary | ICD-10-CM

## 2015-10-02 DIAGNOSIS — M65332 Trigger finger, left middle finger: Secondary | ICD-10-CM

## 2015-10-02 NOTE — Progress Notes (Addendum)
CHIEF COMPLAINT:  Bilateral tingling and numbness hands, snapping, pain, triggering left long finger.                       Supervising: [facility].    HISTORY OF PRESENT ILLNESS: Lance Peters is a 55 year old right-hand-dominant male who for approximately 2 years has been having gradually increasing symptoms of nighttime awakening due to tingling numbness right hand more so than the left.  Recently has been having feelings of stiffness and swelling in both hands on first arising in the morning.  Also in the last 6 months has developed a painful, snapping, intermittent triggering in the left long finger.  He could not recall a specific injury to either wrist in recent or distant past.  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  Has taken meloxicam recently with no significant improvement in these hand symptoms.    PAST MEDICAL HISTORY:  Borderline type 2 diabetes presently diet control, hyperlipidemia.    PAST SURGICAL HISTORY: None.    MEDICATIONS:  See Medication List    ALLERGIES:  See Allergy List    PERSONAL HISTORY: Married, lives with his wife.  Lineman for [employer].    SOCIAL HISTORY: Consumes approximately one beer per day.  Denies tobacco or illicit drug use.    FAMILY HISTORY: Noncontributory.    REVIEW OF SYSTEMS:  Review of Gastointestinal, Genitourinary, Neurologic, Integument, Vascular, Hematologic, Lymphatic, Cardiac, Pulmonary and Endocrine systems reveal the following: The review of systems has been reviewed and is available on the scanned intake form. Positive responses are noted if pertinent.    PHYSICAL EXAM:  Lance Peters is a very pleasant, overweight but otherwise healthy appearing male, appearing stated age, alert, NAD.                      BILATERAL HANDS:                            Multiple callosities but skin is in good repair otherwise                            No thenar or hyperthenar atrophy                            Tinel's positive in the right and  minimal in the left, Phalen's positive at 5 seconds in the right and 20 seconds in the left                            5/5 apposition strength bilaterally                            2 point discrimination at 5 mm inconsistent in the right median nerve distribution intact in the left                            Basal joints are nontender, crank and grind negative                            Finkelstein's negative  Radial and ulnar pulses intact, bounding bilaterally, capillary refill brisk after blanching of the nailbeds                            Tenderness and snapping at the left long finger A1 pulley but excellent range of motion        IMAGING:    ASSESSMENT AND DIAGNOSIS: Bilateral carpal tunnel syndrome, right more so than left.  Left ring and long trigger finger.    PLAN:  The above diagnoses are explained.  I explained the carpal tunnel syndrome can be temporary but also can be permanent and progressive.            Nighttime wrist braces and carpal tunnel stretching exercises are demonstrated and should be done 4 times a day.            EMG studies to further assess carpal tunnel syndrome and severity.            I explained the trigger finger and that this can sometimes be alleviated with corticosteroid injection and Lance Peters wanted to proceed with that.        PROCEDURE:   Procedure: After verbal and visual confirmation of the injection site, left long finger, my initials were placed and a time out was performed at 8:20AM. Timeout participants were myself and the patient.  Two patient identifier's were used. The correct procedure, level, positioning, equipment and site was confirmed.  Appropriate hand hygiene was used.  Alcohol-based prep was then performed.  5 mg of lidocaine and 20 mg of Depo-Medrol were used at the A1 pulley which is well below dose limits based on weight.  Procedure lasted 30 seconds.   - Risks benefits alternative a steroid injection were discussed. Including, but  not limited to, hypopigmentation, pain, allergic reaction, infection.  Lance Peters tolerated the injection well and had no remaining pain in the finger.  Warned regarding post injection pain and can continue with meloxicam and use ice as needed.  Return with EMG results but instructed to call sooner if there are questions or problems.      ORDERS TODAY:    ORDERS PLACED FOR NEXT VISIT:    PERCENT OF TEMPORARY IMPAIRMENT:      The above document was generated using voice recognition software. Reasonable attempts at correction were made. Please excuse any unintended transcription errors.

## 2015-10-02 NOTE — Patient Instructions (Signed)
Dear Lance Peters,    Your physician has determined that you require durable medical equipment (DME) as a part of your treatment.  Knee braces, cast boots, walking boots, crutches, etc. Are DME.  These items offer protection and provide for your safety.  The type and quality of DME has been prescribed for you by your provider.      We cannot determine how much of the cost of this product will be paid by your insurance carrier.  Therefore, you may receive a bill for the outstanding balance.  It is your responsibility to pay whatever fee your insurance carrier does not.    DME Return Policy:     Braces and boots are not returnable if work outside of clinic due to hygiene concerns.   Poorly fitting braces can be exchanged for a correct fit within 1 week if the DME is in excellent condition.   DME that was not dispensed by [facility] will not be accepted.    If DME must be returned, it must be returned to the office that dispensed it.   Special order braces are billed at the time of order and are non-refundable.   Brace parts can be ordered and replaced if they become damaged or worn out.  This may include a charge.    Your type of brace: Other - right and left 7" wrist brace size large    Patient Signature: __________________________  10/02/2015

## 2015-10-12 ENCOUNTER — Ambulatory Visit
Admission: RE | Admit: 2015-10-12 | Discharge: 2015-10-12 | Disposition: A | Discharge status: Home / Self Care | Payer: Self-pay | Attending: Gastroenterology | Admitting: Gastroenterology

## 2015-10-12 ENCOUNTER — Encounter: Payer: Self-pay | Admitting: Gastroenterology

## 2015-10-12 VITALS — BP 132/75 | HR 70 | Temp 97.9°F | Resp 18 | Ht 70.0 in | Wt 200.0 lb

## 2015-10-12 DIAGNOSIS — Z86010 Personal history of colonic polyps: Secondary | ICD-10-CM

## 2015-10-12 HISTORY — DX: Unspecified osteoarthritis, unspecified site: M19.90

## 2015-10-12 LAB — CBC
Hematocrit: 43 % (ref 40–51)
Hemoglobin: 14.1 g/dL (ref 13.7–17.5)
MCH: 27 pg/cell (ref 26–32)
MCHC: 33 g/dL (ref 32–37)
MCV: 84 fL (ref 79–92)
Platelets: 178 10*3/uL (ref 150–330)
RBC: 5.1 MIL/uL (ref 4.6–6.1)
RDW: 12.8 % (ref 11.6–14.4)
WBC: 7.5 10*3/uL (ref 4.2–9.1)

## 2015-10-12 LAB — PROTIME-INR
INR: 1.2 — ABNORMAL HIGH (ref 0.9–1.1)
Protime: 13.7 s — ABNORMAL HIGH (ref 10.0–12.9)

## 2015-10-12 MED ORDER — SODIUM CHLORIDE 0.9 % IV SOLN WRAPPED *I*
100.0000 mL/h | Status: DC
Start: 2015-10-12 — End: 2015-10-13
  Administered 2015-10-12: 100 mL/h via INTRAVENOUS

## 2015-10-12 MED ORDER — MIDAZOLAM HCL 5 MG/5ML IJ SOLN *I*
INTRAMUSCULAR | Status: AC
Start: 2015-10-12 — End: 2015-10-12
  Filled 2015-10-12: qty 10

## 2015-10-12 MED ORDER — MIDAZOLAM HCL 5 MG/5ML IJ SOLN *I*
INTRAMUSCULAR | Status: AC | PRN
Start: 2015-10-12 — End: 2015-10-12
  Administered 2015-10-12: 2 mg via INTRAVENOUS
  Administered 2015-10-12: 1 mg via INTRAVENOUS
  Administered 2015-10-12: 2 mg via INTRAVENOUS
  Administered 2015-10-12: 1 mg via INTRAVENOUS

## 2015-10-12 MED ORDER — FENTANYL CITRATE 50 MCG/ML IJ SOLN *WRAPPED*
INTRAMUSCULAR | Status: AC | PRN
Start: 2015-10-12 — End: 2015-10-12
  Administered 2015-10-12: 14:00:00 50 ug via INTRAVENOUS
  Administered 2015-10-12: 15:00:00 25 ug via INTRAVENOUS
  Administered 2015-10-12: 14:00:00 50 ug via INTRAVENOUS

## 2015-10-12 MED ORDER — FENTANYL CITRATE 50 MCG/ML IJ SOLN *WRAPPED*
INTRAMUSCULAR | Status: AC
Start: 2015-10-12 — End: 2015-10-12
  Filled 2015-10-12: qty 4

## 2015-10-12 MED ORDER — EPINEPHRINE HCL 0.1 MG/ML IJ SOLUTION *WRAPPED*
INTRAMUSCULAR | Status: AC | PRN
Start: 2015-10-12 — End: 2015-10-12
  Administered 2015-10-12: .4 mg via SUBMUCOSAL

## 2015-10-12 NOTE — Preop H&P (Signed)
OUTPATIENT PRE-PROCEDURE H&P    Chief Complaint / Indications for Procedure: Polyp surveillance    Past Medical History:     Past Medical History:   Diagnosis Date    Arthritis     elbows and shoulders     Chest pain, unspecified     Depression 05/23/2014    Neuromuscular disorder     elbows, shoulders    Unspecified essential hypertension 06/02/2013     History reviewed. No pertinent surgical history.  Family History   Problem Relation Age of Onset    Colon polyps Mother     Cancer Mother     Colon cancer Neg Hx      Social History     Social History    Marital status: Married     Spouse name: N/A    Number of children: N/A    Years of education: N/A     Social History Main Topics    Smoking status: Never Smoker    Smokeless tobacco: Never Used    Alcohol use 0.6 oz/week     1 Cans of beer per week    Drug use: No    Sexual activity: Not Asked     Other Topics Concern    None     Social History Narrative       Allergies:    Allergies   Allergen Reactions    Penicillins      rash       Medications:    (Not in a hospital admission)   Current Outpatient Prescriptions   Medication    piroxicam (FELDENE) 20 MG capsule    sertraline (ZOLOFT) 100 MG tablet    piroxicam (FELDENE) 20 MG capsule    atorvastatin (LIPITOR) 20 MG tablet    benazepril (LOTENSIN) 10 MG tablet    amLODIPine (NORVASC) 10 MG tablet    aspirin 81 MG tablet     Current Facility-Administered Medications   Medication Dose Route Frequency    sodium chloride 0.9 % IV  100 mL/hr Intravenous Continuous     Vitals:    10/12/15 1404   BP: 131/86   Pulse: 52   Resp: 18   Temp: 36.6 C (97.9 F)   Weight: 90.7 kg (200 lb)   Height: 177.8 cm (5\' 10" )       ROS:  GI:See HPI.    Physical Examination:  Head/Nose/Throat:negative  Lungs:Lungs clear  Cardiovascular:normal S1 and S2  Abdomen: abdomen soft, non-tender, nondistended, normal active bowel sounds, no masses or organomegaly      Lab Results: All labs in the last 24 hours: No results  found for this or any previous visit (from the past 24 hour(s)).    Radiology impressions (last 30 days):  No results found.    Currently Active Problems:  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        Impression:  Surveillance colonoscopy    Plan:  Colonoscopy  Moderate Sedation    UPDATES TO PATIENT'S CONDITION on the DAY OF SURGERY/PROCEDURE    I. Updates to Patient's Condition (to be completed by a provider privileged to complete a H&P, following reassessment of  the patient by the provider):    Full H&P done today; no updates needed.    II. Procedure Readiness   I have reviewed the patient's H&P and updated condition. By completing and signing this form, I attest that this patient is ready for surgery/procedure.      III. Attestation   I have reviewed the updated information regarding the patient's condition and it is appropriate to proceed with the planned surgery/procedure.    Levada Dy, MD as of 2:20 PM 10/12/2015

## 2015-10-12 NOTE — Discharge Instructions (Signed)
Gastroenterology Unit  Discharge Instructions for Colonoscopy      10/12/2015    3:01 PM    Colonoscopy and Polyp(s) Removed    Do not drive, operate heavy machinery, drink alcoholic beverages, make important personal or business decisions, or sign legal documents until the next day.      Return to your usual diet   Return to taking your usual medications   Do not take aspirin, Advil, Motrin, or other anti-inflammatory for 7 days     Things you may expect:   A small amount of bright red blood in your stool   It may be a few days before you have a bowel movement   You may have cramping, bloating, and feelings of "gas". These feelings should go away as you pass gas. If you still feel uncomfortable, walking around will help to pass the gas.   You were given medication to help you relax during the test. You may feel "fuzzy" and drowsy. Go home and rest for at least 4-6 hours.    You should call your doctor for any of the following:   Bad stomach pain   Fever   Bright red bleeding or clots (This may happen up to 20 days after the test.)   Dizziness or weakness that gets worse or lasts up to 24 hours.   Pain or redness at the IV site    If you have a serious problem after hours, Call [phone] to reach the GI physician on call. If you are unable to reach your doctor, go to the [facility] Emergency Department.    Follow Up Care:   If biopsies were taken during your procedure, we will send you the pathology results within 7-10 days. If you do not receive your pathology results after 10 days please call [phone]   Schedule for repeat colonoscopy given suboptimal visualization on today's examination due to bleeding from polypectomy site    New Prescriptions    No medications on file

## 2015-10-12 NOTE — Procedures (Addendum)
Procedure Report    Colonoscopy Procedure Note   Date of Procedure: 10/12/2015   Referring Physician: Penni Bombard, MD  Primary Physician: Penni Bombard, MD        Attending Physician: Levada Dy, MD  Fellow:   None  Indications:  Surveillance for history of colonic polyps  Previous colonoscopy: Yes -- Date: 2014  Medications: Fentanyl 125 mcg IV, Midazolam 6 mg IV and Oxygen therapy were administered incrementally over the course of the procedure to achieve an adequate level of conscious sedation.    Moderate Sedation Face Times  Start Time: [id number]  End Time: 1458  Duration (minutes): 36 Minutes        Scopes: [id number]    Procedure Details: Full disclosures of risks were reviewed with patient as detailed on the consent form. The patient was placed in the left lateral decubitus position and monitored with continuous pulse oximetry, interval blood pressure monitoring and direct observations.   After anorectal examination was performed, the colonoscope was inserted into the rectum and advanced under direct vision to the terminal ileum. The procedure was considered not difficult.  During withdrawal examination, the final quality of the prep was [facility] Bowel Prep Scale:  Right Colon: Grade3- (entire mucosa of colon segment seen well, with no residual staining, small fragments of stool, or opaque liquid)  Transverse Colon: Grade 2- (minor amount of residual staining, small fragments of stool, and/or opaque liquid, but mucosa of colon segment is seen well)  Left Colon: Grade 1- (portion of mucosa of the colon segment seen, but other areas of the colon segment are not well seen because of staining, residual  stool, and/or opaque liquid)  A careful inspection was made as the colonoscope was withdrawn. A retroflexed view of the rectum was performed; findings and interventions are described below. The patient was recovered in the GI recovery area.     Scope Times:     Insertion Time: 1425  Cecum Time: 1427  Exit Time:  [id number]        Findings:    Anorectal:   Normal tone, no masses  Terminal Ileum:   Normal ileal mucosa  Cecum:   1 polyp(s) 5 mm in size,  sessile, removed by cold snare and retrieved and sent to pathology  Ascending Colon:   Normal mucosa  Transverse Colon:   1 polyp(s) 3 mm in size,  sessile, removed by jumbo biopsy and retrieved and sent to pathology    After polyp was removed, active bleeding noted from polypectomy site with evolving hematoma, 2 hemoclips placed, then area was injected with epinephrine 0.4mg  (19mL), then a further 2 hemoclips placed with hemostasis  Descending Colon:   Mucosal obscured by blood coating wall  Sigmoid:   Mucosal obscured by blood coating wall    Prior tattoo site visualized    Rectum:   Normal rectum throughout      Intervention(s):      1 polyp(s) removed by cold snare biopsy   1 polyp(s) removed by jumbo biopsy    Hemoclip x 4   Epinephrine injection 0.4mg  (42mL)      Complication(s):     bleeding    Impression(s):     Multiple polyps as removed above.   Diverticulosis, located in the cecum .    Recommendation(s):     Await pathology.   Will need repeat colonoscopy given that the transverse and left colon was suboptimally visualized in setting of blood from transverse colon removal.    Monitor closely  for signs/symptoms of recurrent bleeding.   Patient and patient's wife directed to present to ED if BRBPR, SOB, lightheadedness/dizziness, abdominal pain, fever.    Histopathologic Diagnosis:  pending  Levada Dy, MD     Moderate Sedation Face Times  Start Time: [id number]  End Time: 1458  Duration (minutes): 36 Minutes

## 2015-10-15 ENCOUNTER — Telehealth: Payer: Self-pay | Admitting: Gastroenterology

## 2015-10-15 ENCOUNTER — Telehealth: Payer: Self-pay

## 2015-10-15 NOTE — Telephone Encounter (Signed)
Patient is calling to go over his lab results from recent blood work on 10/12/2015.  He would also like to reschedule his colonoscopy as well.  Patient can be reached at [phone]

## 2015-10-15 NOTE — Telephone Encounter (Signed)
Post procedure follow up call: Spoke with Lance Peters. Patient stated " I'm good, no bleeding over the weekend ". Reinforced to patient to call the phone number on the discharge form for any further questions or concerns in the future.

## 2015-10-16 NOTE — Telephone Encounter (Signed)
Writer will contact patient tomorrow to schedule appt request

## 2015-10-16 NOTE — Telephone Encounter (Signed)
Please call tomorrow and let him know his blood counts were normal. Shellia Carwin, [place]

## 2015-10-16 NOTE — Telephone Encounter (Signed)
Called pt and reviewed lab results per V. Nadara Mustard, [place].

## 2015-10-16 NOTE — Telephone Encounter (Signed)
Patient calling to speak to someone regarding scheduling an appointment and also to speak to a nurse regarding his lab results. He states he will be unavailable the rest of the day and is requesting a call back tomorrow, 7/12. He can be reached at [phone].

## 2015-10-17 ENCOUNTER — Telehealth: Payer: Self-pay | Admitting: Gastroenterology

## 2015-10-17 LAB — SURGICAL PATHOLOGY

## 2015-10-17 NOTE — Telephone Encounter (Signed)
Lance Peters is calling to cancel his appointment which is currently scheduled for 11/30/15.    Has the appointment been cancelled? no    Has the appointment been rescheduled? no    Does the patient need a call back to reschedule?yes    Patient can be contacted back at [phone]

## 2015-10-17 NOTE — Telephone Encounter (Signed)
Discussed blood work and pathology, will need repeat, which he will schedule.

## 2015-10-17 NOTE — Telephone Encounter (Signed)
Spoke with patient scheduled COB  11/30/2015 at 12:45 PM at [facility]/ Mailed prep

## 2015-10-17 NOTE — Telephone Encounter (Signed)
Pt rescheduled for COB. COB is on 12/28/15. He would like sooner appointment if possible.

## 2015-10-31 ENCOUNTER — Encounter: Payer: Self-pay | Admitting: Physical Medicine and Rehabilitation

## 2015-11-01 ENCOUNTER — Ambulatory Visit: Payer: Self-pay | Admitting: Orthopedic Surgery

## 2015-11-01 ENCOUNTER — Ambulatory Visit: Payer: Self-pay | Admitting: Physical Medicine and Rehabilitation

## 2015-11-01 ENCOUNTER — Encounter: Payer: Self-pay | Admitting: Physical Medicine and Rehabilitation

## 2015-11-01 VITALS — BP 142/76 | Ht 70.0 in | Wt 200.0 lb

## 2015-11-01 DIAGNOSIS — G5621 Lesion of ulnar nerve, right upper limb: Secondary | ICD-10-CM

## 2015-11-01 DIAGNOSIS — G5603 Carpal tunnel syndrome, bilateral upper limbs: Secondary | ICD-10-CM | POA: Insufficient documentation

## 2015-11-01 NOTE — Progress Notes (Signed)
[facility], [place], [place]  [id number] / [phone]  Test Date:  11/01/2015    Patient: Lance Peters DOB: Jan 07, 1961 Physician: Jamesetta Orleans, MD   Sex: Male Height: 5\' 10"  Ref Phys: Dirisio Kelle Darting   ID#[id number] Weight: 200 lbs. Technician:      Patient Complaints: The patient is a 55 year old right-handed male referred for a bilateral upper limb electrodiagnostic study.  Chief complaint: Bilateral hand numbness tingling pain and weakness.  Left trigger finger.  The patient is had symptoms for years.  His symptoms are worse at night and improved with wearing wrist splints.  The left trigger finger was treated with a steroid injection.  Patient referred for electrodiagnostic testing to evaluate for bilateral carpal tunnel syndrome.      Past Medical History updated and recorded on the electronic medical record      Medications: Updated and recorded on the electronic medical record          NCV & EMG Findings:    Limb temperature monitoring, with limb temperature maintained greater than 32C in the upper extremities and greater than 30C in the lower extremities.      The right and left median sensory responses are abnormal with slow conduction at each wrist.     The right and left median motor responses are normal.     The right ulnar sensory response is abnormal with sensory axon loss.     The right ulnar motor response is normal.     The left ulnar sensory and motor responses are normal.     All remaining nerves (as indicated in the following tables) were within normal limits.   All left vs. right side differences were within normal limits.   All F Wave latencies were within normal limits.   All F Wave left vs. right side latency differences were within normal limits.   Concentric needle examination of right median and ulnar muscles is normal.          Impression: Abnormal study.  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  There is  sensory fiber demyelination without significant motor or sensory axon loss.  There is a nonlocalizeable right ulnar neuropathy involving the digital sensory branch to the small finger.      Recommendations: The patient is scheduled to follow-up with the orthopedic hand division tomorrow.          ___________________________  Jamesetta Orleans, MD        Nerve Conduction Studies  Anti Sensory Summary Table     Stim Site NR Onset (ms) Peak (ms) Norm Peak (ms) P-T Amp (V) Norm P-T Amp Site1 Site2 Delta-0 (ms) Dist (cm) Vel (m/s) Norm Vel (m/s)   Left Median Anti Sensory (2nd Digit)   Wrist    2.8 3.3 <3.7 29.9 >20 Wrist Palm 1.7 7.0 41 >50   Palm    1.1 1.7  32.2 >20 Palm 2nd Digit 1.1 7.0 64 >50   Right Median Anti Sensory (2nd Digit)   Wrist    3.1 3.7 <3.7 26.6 >20 Wrist Palm 2.0 7.0 35 >50   Palm    1.1 1.6  15.7 >20 Palm 2nd Digit 1.1 7.0 64 >50   Left Ulnar Anti Sensory (5th Digit)   Wrist    2.5 3.2 <3.7 19.5 >13 Wrist 5th Digit 2.5 14.0 56    Right Ulnar Anti Sensory (5th Digit)  Wrist    2.9 3.7 <3.7 11.8 >13 Wrist 5th Digit 2.9 14.0 48      Motor Summary Table     Stim Site NR Onset (ms) Norm Onset (ms) O-P Amp (mV) Norm O-P Amp Neg Area (mVms) Site1 Site2 Delta-0 (ms) Dist (cm) Vel (m/s) Norm Vel (m/s)   Left Median Motor (Abd Poll Brev)   Wrist    3.9 <4.4 12.1 >5 34.12 Elbow Wrist 4.9 22.3 46 >50   Elbow    8.8  9.9  27.40         Right Median Motor (Abd Poll Brev)   Wrist    3.8 <4.4 8.5 >5 24.56 Elbow Wrist 5.0 21.6 43 >50   Elbow    8.8  6.3  19.76         Left Ulnar Motor Run #1 (Abd Dig Minimi)   Wrist    3.8 <3.7 10.6 6 23.97 B Elbow Wrist 3.7 19.3 52 >50   B Elbow    7.5  8.9  21.22 A Elbow B Elbow 2.0 10.0 50 >50   A Elbow    9.5  8.6  21.07         Left Ulnar Motor Run #2 (Abd Dig Minimi)   Wrist    3.5 <3.7 8.4 6 16.93 B Elbow Wrist 3.8 19.3 51 >50   B Elbow    7.3  7.6  15.43 A Elbow B Elbow 1.9 10.0 53 >50   A Elbow    9.2  7.9  16.31         Right Ulnar Motor (Abd Dig Minimi)   Wrist    3.0 <3.7  8.5 6 21.23 B Elbow Wrist 3.4 19.5 57 >50   B Elbow    6.4  7.4  19.29 A Elbow B Elbow 1.9 9.5 50 >50   A Elbow    8.3  6.9  18.21         Right Ulnar Motor Run #2 (Abd Dig Minimi)   Wrist    4.1 <3.7 8.5 6 18.97 B Elbow Wrist 3.7 18.8 51 >50   B Elbow    7.8  7.9  17.59 A Elbow B Elbow 1.7 9.3 55 >50   A Elbow    9.5  8.0  18.42           Comparison Summary Table     Stim Site NR Onset (ms) Peak (ms) Norm Peak (ms) P-T Amp (V) Site1 Site2 Delta-P (ms) Norm Delta (ms)   Left Median/Radial Dig I Comparison (Digit 1 - 10cm)   Median    2.5 3.0 <2.9 10.4 Median Radial 0.7 <0.5   Radial    1.8 2.3 <2.8 5.8       Right Median/Radial Dig I Comparison (Digit 1 - 10cm)   Median    2.5 3.2 <2.9 11.0 Median Radial 0.9 <0.5   Radial    1.7 2.3 <2.8 5.3       Left Median/Ulnar Palm Comparison (Wrist - 8cm)   Median Palm    1.8 2.3 <2.2 32.3 Median Palm Ulnar Palm 0.2 <0.58ms   Ulnar Palm    1.6 2.1 <2.2 7.5               F Wave Studies     NR F-Lat (ms) Lat Norm (ms) L-R F-Lat (ms) L-R Lat Norm M-Lat (ms) FLat-MLat (ms)   Left Median (Mrkrs) (Abd Poll Brev)      30.83 <33  1.09 <2.2 3.91 26.92   Right Median (Mrkrs) (Abd Poll Brev)      29.75 <33 1.09 <2.2 3.91 25.84   Left Ulnar (Mrkrs) (Abd Dig Min)      30.47 <33  <2.5 3.55 26.92     EMG     Side Muscle Nerve Root Ins Act Fibs PSW Fasic Amp Dur Poly Config Recrt Comment   Right FlexCarRad Median C6-7 Nml Nml Nml None Nml Nml Nml Nml Nml    Right 1stDorInt Ulnar C8-T1 Nml Nml Nml None Nml Nml Nml Nml Nml    Right Opp Pollicis Median 0000000 Nml Nml Nml None Nml Nml Nml Nml Nml            Waveforms:

## 2015-11-02 ENCOUNTER — Encounter: Payer: Self-pay | Admitting: Orthopedic Surgery

## 2015-11-02 ENCOUNTER — Ambulatory Visit: Payer: Self-pay | Admitting: Orthopedic Surgery

## 2015-11-02 VITALS — BP 135/80 | HR 58 | Ht 70.0 in | Wt 200.0 lb

## 2015-11-02 DIAGNOSIS — G5603 Carpal tunnel syndrome, bilateral upper limbs: Secondary | ICD-10-CM

## 2015-11-02 DIAGNOSIS — M65332 Trigger finger, left middle finger: Secondary | ICD-10-CM

## 2015-11-02 NOTE — Progress Notes (Signed)
CHIEF COMPLAINT: Bilateral carpal tunnel syndrome, left long/middle trigger finger.                         Supervising: RG.    INTERVAL HISTORY:   Lance Peters returns and continues to have quite persistent nighttime awakening due to tingling/numbness and awakens in the morning with feelings of stiffness and some swelling both hands.  He says that the middle trigger finger is improved but he still occasionally notes some snapping and infrequent triggering.  Report from Dr. Tonye Becket, EMG studies, reveals mild median neuropathy bilaterally.    PFSH:  Since last visit no change.    ROS:  Since last visit no change.    MEDICATION:  Since last visit no new meds.    ALLERGIES:  As per initial evaluation.    PHYSICAL EXAM:             BILATERAL WRISTS:                    Tinel's remains positive at both carpal tunnels                    2 point discrimination 5 mm diminished at both median nerve distributions                    5/5 apposition strength                    Left long/middle finger has some tenderness remaining at the A1 pulley with snapping but no triggering.          IMAGING:    ASSESSMENT: Bilateral carpal tunnel syndrome and left long/middle trigger finger.    PLAN: Lance Peters has had fairly persistent gradually increasing symptoms of carpal tunnel over the last 2 years and is interested in proceeding with surgical treatment for this as well as for the left long trigger finger.  I offered to have him see Dr. Patsey Berthold in a separate visit but he felt this not to be necessary as he could see Dr. Patsey Berthold day of surgery as long as I would explained the surgical procedure which was done.  I also explained the expected postoperative course and that he would need to stage I wrist followed by the other probably about 3 weeks apart further surgical procedures.  He will need to be out of work probably for about 8 weeks total.  I explained the potential risks of said surgery including pain, bleeding, infection, injury to blood  vessels, tendons, nerves, incomplete relief of symptoms, scar tissue formation, recurrence, need for further surgical procedures.  Lance Peters understands all of that.          Have made arrangements for left carpal tunnel release and trigger finger release of the left long finger to be done under local anesthesia at [facility] by Dr. Patsey Berthold.          He will call prior to that if there are questions or problems    ORDERS TODAY:    ORDERS PLACED FOR NEXT VISIT:    PERCENT OF TEMPORARY IMPAIRMENT:      The above document was generated using voice recognition software. Reasonable attempts at correction were made. Please excuse any unintended transcription errors.

## 2015-11-07 ENCOUNTER — Encounter: Payer: Self-pay | Admitting: Orthopedic Surgery

## 2015-11-07 ENCOUNTER — Ambulatory Visit: Payer: Self-pay | Admitting: Orthopedic Surgery

## 2015-11-07 ENCOUNTER — Ambulatory Visit: Payer: Self-pay

## 2015-11-07 VITALS — BP 172/81 | HR 54 | Temp 98.2°F | Resp 18 | Ht 70.0 in | Wt 200.0 lb

## 2015-11-07 DIAGNOSIS — G5603 Carpal tunnel syndrome, bilateral upper limbs: Secondary | ICD-10-CM

## 2015-11-07 DIAGNOSIS — M65332 Trigger finger, left middle finger: Secondary | ICD-10-CM

## 2015-11-07 MED ORDER — HYDROCODONE-ACETAMINOPHEN 5-325 MG PO TABS *I*
1.0000 | ORAL_TABLET | ORAL | 0 refills | Status: DC | PRN
Start: 2015-11-07 — End: 2016-04-15

## 2015-11-07 NOTE — Procedures (Signed)
Patient greeted. A+oX 3. Verified site and procedure as consented.  Site initialed by MD.  Questions answered.

## 2015-11-07 NOTE — Procedures (Signed)
PRE:  Pt greeted.  Allergies, history, and patients questions reviewed.  Patient verbally verified site and procedure as scheduled and consented.  Site is marked with MD initials. Patient to OR  AMBULATORY.      INTRA:  Patient positioned on stretcher supine with LEFT arm to padded hand table, pillow under head.  Patient prepped in sterile manner by [facility],RN.  Patient  then draped by surgical team.  Time out completed as per document.  Procedure underway.  Counts Correct, sterile dressing applied.  Hand and fingers pink with positive movement.    POST:  Patient transferred to recovery room AMBULATORY.

## 2015-11-07 NOTE — Patient Instructions (Signed)
Orthopedics Discharge Instructions:    FOLLOW-UP CARE:  An appointment has been scheduled with Dr. Patsey Berthold, / Lisabeth Devoid IN 10-14 DAYS    INSTRUCTIONS:  Notify DR Patsey Berthold, M.D. (Phone #: [phone]) or after hours call answering service at [phone] promptly if you experience any of the following symptoms:     Fever of 101 degrees F/38.4 degrees C or higher   Foul smelling drainage from incision or dressing   Pain medication ineffective   Persistent nausea or vomiting into the next day   Excessive swelling   Bleeding that soaks the bandage that doesn't stop after applying pressure to the wound for 10 minutes   Pale, blue, or cold fingers/toes   Loss of sensation/movement of fingers/toes    DIET:  Resume your previous diet    CARE OF DRESSING OR INCISION:  Do NOT remove dressing unless told to do so by your doctor. Keep dressing clean and dry.    BATHING/SHOWERING:  Cover dressing with a plastic bag. May shower.    ACTIVITY:  No heavy gripping or lifting., Keep your hand elevated so that it is above the level of your heart for 2-3 days. and Move fingers through a full range of motion and gently move your shoulder and elbow to prevent stiffness.    PAIN MANAGEMENT/OTHER:  Resume current medications.       Surgical Site Infections FAQs    What is a Surgical Site infection (SSI)?  A surgical site infection is an infection that occurs after surgery In the part of the body where the surgery took place. Most patients who have surgery do not develop an infection. However, Infections develop in about 1 to 3 out of every 100 patients who have surgery.     Some of the common symptoms of a surgical site infection are:    Redness and pain around the area where you had surgery    Drainage of cloudy fluid from your surgical wound    Fever     Can SSIs be treated?   Yes. Most surgical site infections can be treated with antibiotics. The antibiotic given to you depends on the bacteria (germs) causing the Infection. Sometimes  patients with SSIs also need another surgery to treat the infection.     What are some of the things that hospitals are doing to prevent SSls?   To prevent SSIs, doctors, nurses, and other healthcare providers:    Clean their hands and arms up to their elbows with an antiseptic agent just before the surgery.    Clean their hands with soap and water or an alcohol-based hand rub before and after caring for each patient.    May remove some of your hair Immediately before your surgery using electric clippers If the hair Is in the same area where the procedure will occur. They should not shave you with a razor.    Wear special hair covers, masks, gowns, and gloves during surgery to keep the surgery area clean.    Give you antibiotics before your surgery starts. in most cases, you should get antibiotics within 60 mInutes before the surgery starts and the antibiotics should be stopped within 24 hours after surgery.    Clean the skin at the site of your surgery with a special soap that kills germs.     What can I do to help prevent SSIs?   Before your surgery:    Tell your doctor about other medical problems you may have. Health problems such as  allergies, diabetes, and obesity could affect your surgery and your treatment.    Quit smoking. Patients who smoke get more Infections. Talk to your doctor about how you can quit before your surgery.    Do not shave near where you will have surgery. Shaving with a razor can Irritate your skin and make it easier to develop an infection.   At the time of your surgery:    Speak up if someone tries to shave you with a razor before surgery. Ask why you need to be shaved and talk with your surgeon if you have any concerns.    Ask if you will get antibiotics before surgery.   After your surgery:    Make sure that your healthcare providers clean their hands before examining you, either with soap and water or an alcohol-based hand rub,      If you do not see your healthcare  providers wash their hands,   please ask them to do so.     Family and friends who visit you should not touch the surgical wound or dressings.    Family and friends should clean their hands with soap and water or an alcohol-based hand rub before and after visiting you. If you do not see them clean their hands, ask them to clean their hands.   What do I need to do when I go home from the hospital?    Before you go home, your doctor or nurse should explain everyt hing you need to know about taking care of your wound. Make sure you understand how to care for your wound before you leave the hospital.    Always clean your hands before and after caring for your wound.    Before you go home, make sure you know who to contact If you have questions or problems after you get home.    If you have any symptoms of an Infection, such as redness and pain at the surgery site, drainage, or fever, call your doctor immediately.   if you have additional questions, Please ask your doctor or nurse.

## 2015-11-08 NOTE — Procedures (Signed)
SURGEON: Kristine Garbe   ASSISTANT: Mervyn Gay   SURGERY DATE:11/08/15   PREOPERATIVE DIAGNOSIS: Left carpal tunnel syndrome.   POSTOPERATIVE DIAGNOSIS: Left carpal tunnel syndrome.   OPERATIVE PROCEDURE: Left carpal tunnel release.   ANESTHESIA: 1% lidocaine with epinephrine, .25 percent marcaine plain 10 cc 50/50 mix   INDICATIONS/FINDINGS: The patient presents with a left carpal tunnel   symptomatology, diagnosis confirmed by electrodiagnostic studies. At the   time of the procedure, the median nerve did appear to be markedly   compressed beneath the mid portion of the transverse carpal ligament.   DESCRIPTION OF PROCEDURE: The patient was administered their   local anesthetic in the line of the incision in the interval between the   thenar and hypothenar eminence. With the left hand, wrist, and forearm   prepped and draped in the usual sterile fashion on the hand table.   The longitudinal incision was made 1.5 cm in length in the interval between   the thenar and hypothenar eminence. Dissection was made with loupe   magnification. Care was taken to respect both the course of the median   nerve and the palmar cutaneous branch of the median nerve and the ulnar   neurovascular bundle. The transverse carpal ligament was identified and   then released longitudinally keeping the median nerve beneath the radial   leaf of the ligament.   This wound was irrigated and the skin closed with 4-0 monocryl. A sterile   dressing.   Arrangements were made for follow up in the outpatient musculoskeletal   clinic. The prognosis for relief of the patient's complaints of   significant persistent carpal tunnel symptomatology is good.   Dr. Patsey Berthold present for the entire procedure.      Trigger Finger Release Procedure Note    Indications: The patient has symptoms of trigger digit that that have not responded to nonoperative treatment..     Pre-operative Diagnosis: left 3rd Trigger Finger    Post-operative Diagnosis:  left 3rd Trigger Finger    Surgeon: Edgardo Roys, DO     Assistants: Mervyn Gay    Anesthesia: Local anesthesia 1% plain lidocaine, 0.5% bupivacaine, with epinephrine    ASA Class: 2    Procedure Details   The patient was seen in the Holding Room. The risks, benefits, complications, treatment options, and expected outcomes were discussed with the patient. The risks and potential complications of their problem and purposed treatment include but are not limited to infection, bleeding, pain, stiffness, nerve and vessel injury and complication secondary to the anesthetic. The patient concurred with the proposed plan, giving informed consent.  The site of surgery properly noted/marked. The patient was taken to Operating Room # 1, identified as Lance Peters and the procedure verified as Trigger Finger Release. A Time Out was held and the above information confirmed.    Local anesthesia was given with sterile technique, and a tourniquet was placed above the elbow.  The limb was exsanguinated, then sterilely prepped and draped.  The Middle finger was approached through a volar oblique incision, over the 1st annular pulley. Bluntly the deep tissues were dissected, taking care to avoid injury to the digital nerves.  The tendon sheath was exposed and incised longitudinally through the 1st annular pulley.  There was fluid in the sheath, and a hard lump in the tendon. The patient could range the finger and the active triggering was resolved.    The wound was closed with nylon horizontally mattressed sutures.  A sterile  dressing was applied, pressure was held to the wound and the tourniquet was deflated.  A light bulky dressing was applied.      Estimated Blood Loss:  Minimal                Complications:  None; patient tolerated the procedure well.           Disposition: PACU - hemodynamically stable.           Condition: stable    Attending Attestation: I was present and scrubbed for the entire procedure.

## 2015-11-14 ENCOUNTER — Ambulatory Visit: Payer: Self-pay

## 2015-11-16 ENCOUNTER — Ambulatory Visit: Payer: Self-pay | Admitting: Otolaryngology

## 2015-11-16 ENCOUNTER — Encounter: Payer: Self-pay | Admitting: Otolaryngology

## 2015-11-16 VITALS — BP 133/82 | HR 71 | Wt 200.0 lb

## 2015-11-16 DIAGNOSIS — G5603 Carpal tunnel syndrome, bilateral upper limbs: Secondary | ICD-10-CM

## 2015-11-16 DIAGNOSIS — M65332 Trigger finger, left middle finger: Secondary | ICD-10-CM

## 2015-11-16 NOTE — Progress Notes (Signed)
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 This is not a Worker's Compensation Pain is minimal.  Pre-operative paresthesias are improved.  Pre-operative electrodiagnostics reveal mild median neuropathies at both wrists with a nonlocalizeable right ulnar neuropathy involving the digital sensory branch to the small finger.     Objective:    Vital Signs:   Visit Vitals    BP 133/82    Pulse 71    Wt 90.7 kg (200 lb)    BMI 28.7 kg/m2    Alert and oriented x 3. No acute distress.   Surgical dressing is removed. Surgical wound is benign and sutures left in.  There is the expected amount of swelling. There is no infection.  Finger motion is limited flexion.    Assessment/ Plan:    1. Bilateral carpal tunnel syndrome, s/p left release 11/07/2015  2. Left middle trigger finger s/p release 11/07/2015    Satisfactory initial postop course.  Reassurance is given that persistent numbness or incomplete recovery is anticipated given severity of pre-operative electrodiagnostics and/or co morbidities.   Education regarding wound care, pain medication, activity, physical therapy and elevation is provided.  Splint provided -Smart Glove.  Hand rehabilitation was initiated.  Follow-up week of August 28th, or sooner should any concerns arise in the interim. He was provided with documentation to remain out of work until follow-up to allow for progress to be made in PT.      Orders Next Visit: Anticipate return to work status.

## 2015-11-19 ENCOUNTER — Other Ambulatory Visit
Admission: RE | Admit: 2015-11-19 | Discharge: 2015-11-19 | Disposition: A | Discharge status: Home / Self Care | Payer: Self-pay | Source: Ambulatory Visit | Attending: Primary Care | Admitting: Primary Care

## 2015-11-19 DIAGNOSIS — R7301 Impaired fasting glucose: Secondary | ICD-10-CM

## 2015-11-19 DIAGNOSIS — E785 Hyperlipidemia, unspecified: Secondary | ICD-10-CM

## 2015-11-19 LAB — BASIC METABOLIC PANEL
Anion Gap: 12 (ref 7–16)
CO2: 25 mmol/L (ref 20–28)
Calcium: 8.7 mg/dL (ref 8.6–10.2)
Chloride: 106 mmol/L (ref 96–108)
Creatinine: 1.07 mg/dL (ref 0.67–1.17)
GFR,Black: 89 *
GFR,Caucasian: 77 *
Glucose: 138 mg/dL — ABNORMAL HIGH (ref 60–99)
Lab: 19 mg/dL (ref 6–20)
Potassium: 4.6 mmol/L (ref 3.3–5.1)
Sodium: 143 mmol/L (ref 133–145)

## 2015-11-19 LAB — HEMOGLOBIN A1C: Hemoglobin A1C: 6.6 % — ABNORMAL HIGH (ref 4.0–6.0)

## 2015-11-20 ENCOUNTER — Encounter: Payer: Self-pay | Admitting: Primary Care

## 2015-11-23 ENCOUNTER — Ambulatory Visit: Payer: Self-pay | Admitting: Rehabilitative and Restorative Service Providers"

## 2015-11-23 DIAGNOSIS — M65332 Trigger finger, left middle finger: Secondary | ICD-10-CM

## 2015-11-23 DIAGNOSIS — G5603 Carpal tunnel syndrome, bilateral upper limbs: Secondary | ICD-10-CM

## 2015-11-23 NOTE — Progress Notes (Signed)
[facility] OT CHARGE:   Evaluation:  OT CODE 7165 - CPT CODE OT - 97165 - OT EVAL LOW COMPLX 30MIN  Constant Attendance Modaliities:  OT CODE 6118 - CPT CODE OT - [id number] -  HR Ultrasound/phonoporesis with 10% HC @ (1/3 MHz pulsed/continuous)  Supervised Modalities:  OT CODE 6119 - CPT CODE OT - 97010 - HR Hot / Cold Packs

## 2015-11-23 NOTE — Progress Notes (Signed)
[facility]  OT Initial Evaluation    History  Encounter Diagnoses   Name Primary?    Bilateral carpal tunnel syndrome Yes    Trigger middle finger of left hand      Edgardo Roys, DO  [address]  [place], [place] [address]    Onset date: chronic  Date of Surgery: 11/07/2015    Subjective    Lance Peters is a 55 y.o. male who is present today for left, wrist, left, hand, left, finger pain.  Mechanism of injury/history of symptoms: No specific cause    Pt has received care for 0 visits to date.    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  Other: NA  Diagnostic tests: Per report, reviewed    Symptom location: stiffness of MF, soreness of palm, left  Relevant symptoms: Pain , Decreased ROM, Decreased strength, Swelling  Symptom frequency: Intermittent  Symptom intensity (0 - 10 scale): Now 3 Best 2 Worst 5     Night Pain: No        Morning Pain/Stiffness: Increased   Symptoms worsen with: Lifting, Carrying, Gripping  Symptoms improve with: Rest, Heat  Assistive device:  none  Patients goals for therapy: Return to work, Increase ROM, Increase strength     Objective:    Observation: Edema  Sensibility Screen: deferred   Palpation: Swelling, Tenderness  Scar:  Unremarkable      ROM  Minus 10* extension PIP          Hand Strength: Jamar dynamometer    Position Right Left   1     2 80# 40#   3     4     5          Pinch  Right Left   Lateral 21# 18#   Tip (2 point) 15 11     Hand dominance:  right      Assessment:  Findings consistent with 55 y.o., male with   Encounter Diagnoses   Name Primary?    Bilateral carpal tunnel  syndrome Yes    Trigger middle finger of left hand      with pain, swelling, ROM limitations, strength limitations, functional limitations    Prognosis:  Excellent  Contraindications/Precautions/Limitation:  Per diagnosis  Short Term Goals:  Decrease c/o max pain to < 4/10 and Increase ROM    Long Term Goals:  Pain/Sx 0 - minimal, ROM/ flexibility WNL , Restoration of functional strength, Independent with HEP/education , Functional return to ADLs / activities without limitations      Treatment Plan:     Patient/family involved in developing goals and treatment plan: Yes  Expected length of care 3 month(s)  Freq 2 times per week for 2 month    Treatment plan inclusive of:   Exercise: AROM, AAROM, PROM, Stretching, Progressive Resistive   Manual Techniques:  Soft tissue release/massage   Modalities:  Desensitization, Functional/Therapeutic activites per flowsheet, Manual therapy,  Soft tissue Mobilization / Massage, Moist heat, Ther Exercise per flowsheet, Ultrasound  3MHz   Functional: Functional rehab, Work Public house manager      Request additional:  16 visits for therapy.  Thank you for the referral of this patient to [facility]. Please do not hesitate to contact me if I may be of assistance.      Please submit written approval or fax [phone]    Lenore Cordia, OT,CHT

## 2015-11-27 ENCOUNTER — Ambulatory Visit: Payer: Self-pay | Admitting: Rehabilitative and Restorative Service Providers"

## 2015-11-27 DIAGNOSIS — G5603 Carpal tunnel syndrome, bilateral upper limbs: Secondary | ICD-10-CM

## 2015-11-27 DIAGNOSIS — M65332 Trigger finger, left middle finger: Secondary | ICD-10-CM

## 2015-11-27 NOTE — Progress Notes (Signed)
[facility] OT CHARGE:   Therapeutic Exercise:  OT Code 6004 - CPT Code OT - 97110 - HR Therapeutic Exercise (8 - 22 min) x 1 unit  Manual Therapy:  OT Code 6231 - CPT Code OT - 97140 - HR Manual Therapy (8 - 22 min) x 1 unit  Therapeutic Activities functional:  OT Code 6319 - CPT Code OT - 97530 - HR Therapeutic Activities Functional (8 - 22 min) x 1 unit  Constant Attendance Modaliities:  OT CODE 6118 - CPT CODE OT - 97035 -  HR Ultrasound/phonoporesis with 10% HC @ (1/3 MHz pulsed/continuous)  Supervised Modalities:  OT CODE 6119 - CPT CODE OT - 97010 - HR Hot / Cold Packs

## 2015-11-27 NOTE — Progress Notes (Signed)
Hand Rehab Daily Note    L[facility]   [id number]         Subjective:  Pain Score: 2  Pain: "better every day"    Objective:  ROM - Improved, but he is still concerned about tightness for full extension  Strength - Functional for light ADL's  Function: - Improved, using his hand for basic self-care, some reaching  Education:  Updated HEP, Verbal cues for ther ex, Manual cues for ther ex    Treatment Modalities:  Desensitization, Functional/Therapeutic activites per flowsheet, Manual therapy,  Soft tissue Mobilization / Massage, Moist heat, Ther Exercise per flowsheet, Ultrasound  1MHz     Fabricated elastomer for palmar scar with volar hand-based extension splint at night only    Assessment:   First treatment session after initial evaluation  Looks good, scar tissue is smooth but trying to contract     Plan of Care:  Continue per Plan of care -  As written    Lenore Cordia, OT,CHT

## 2015-11-29 ENCOUNTER — Ambulatory Visit: Payer: Self-pay | Admitting: Rehabilitative and Restorative Service Providers"

## 2015-11-29 DIAGNOSIS — M65332 Trigger finger, left middle finger: Secondary | ICD-10-CM

## 2015-11-29 DIAGNOSIS — G5603 Carpal tunnel syndrome, bilateral upper limbs: Secondary | ICD-10-CM

## 2015-11-29 NOTE — Progress Notes (Signed)
[facility] OT CHARGE:   Therapeutic Exercise:  OT Code 6004 - CPT Code OT - 97110 - HR Therapeutic Exercise (23 - 37 min) x 2 units  Manual Therapy:  OT Code 6231 - CPT Code OT - 97140 - HR Manual Therapy (8 - 22 min) x 1 unit  Constant Attendance Modaliities:  OT CODE 6118 - CPT CODE OT - 97035 -  HR Ultrasound/phonoporesis with 10% HC @ (1/3 MHz pulsed/continuous)  Supervised Modalities:  OT CODE 6119 - CPT CODE OT - 97010 - HR Hot / Cold Packs

## 2015-11-29 NOTE — Progress Notes (Signed)
Hand Rehab Daily Note    [facility]   [id number]         Subjective:  Pain Score: 2  Pain: "better every day"   He is pleased with the paddle splint for nighttime extension, feels it really helps      Objective:  ROM - Improved, full flexion/extension today  Strength - Functional for light ADL's  Function: - Improved, using his hand for basic self-care, some reaching  Education:  Updated HEP, Verbal cues for ther ex, Manual cues for ther ex    Treatment Modalities:  Desensitization, Functional/Therapeutic activites per flowsheet, Manual therapy,  Soft tissue Mobilization / Massage, Moist heat, Ther Exercise per flowsheet, Ultrasound  1MHz           Assessment:   Excellent gains every time  Looks good, scar tissue is smooth  ROM improving     Plan of Care:  Continue per Plan of care -  As written    Lenore Cordia, OT,CHT

## 2015-11-30 ENCOUNTER — Other Ambulatory Visit: Payer: Self-pay | Admitting: Gastroenterology

## 2015-12-03 ENCOUNTER — Ambulatory Visit: Payer: Self-pay | Admitting: Rehabilitative and Restorative Service Providers"

## 2015-12-03 DIAGNOSIS — M65332 Trigger finger, left middle finger: Secondary | ICD-10-CM

## 2015-12-05 ENCOUNTER — Other Ambulatory Visit: Payer: Self-pay | Admitting: Primary Care

## 2015-12-07 ENCOUNTER — Ambulatory Visit: Payer: Self-pay | Admitting: Rehabilitative and Restorative Service Providers"

## 2015-12-07 ENCOUNTER — Ambulatory Visit: Payer: Self-pay | Admitting: Otolaryngology

## 2015-12-07 ENCOUNTER — Encounter: Payer: Self-pay | Admitting: Otolaryngology

## 2015-12-07 VITALS — BP 150/83 | Ht 70.0 in | Wt 200.0 lb

## 2015-12-07 DIAGNOSIS — M65332 Trigger finger, left middle finger: Secondary | ICD-10-CM

## 2015-12-07 DIAGNOSIS — G5603 Carpal tunnel syndrome, bilateral upper limbs: Secondary | ICD-10-CM

## 2015-12-07 NOTE — Progress Notes (Signed)
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 This is not covered under a  Barista. Pain is minimal.  Pre-operative paresthesias have resolved.  Pre-operative electrodiagnostics reveal mild median neuropathies at both wrists with a nonlocalizeable right ulnar neuropathy involving the digital sensory branch to the small finger. His left middle finger is still stiff. He has been participating in formal PT and HEP at [facility] since our last visit, his ROM and strength is improving. He finds the Smart Glove to be comfortable. He is wearing nighttime extension splint. His leave from work has been approved through his employer through 12/19/15.     Objective:    Vital Signs:   BP 150/83   Ht 1.778 m (5\' 10" )   Wt 90.7 kg (200 lb)   BMI 28.7 kg/m2 Alert and oriented x 3. No acute distress.   Skin is without rashes, clean and intact. Surgical wounds well healed.  There is some residual swelling. He is able to make a full fist, he is able to do thumbs up, ok sign, he is able to oppose thumb to all but small finger.Sensation intact, good capillary refill. Normal nail growth, no dupuytren's.     Assessment/ Plan:    1. Bilateral mild carpal tunnel syndrome, s/p left release 11/07/2015  2. Left middle trigger finger s/p release 11/07/2015    Satisfactory initial postop course.  Continue with HEP and formal PT. Smart Glove if helpful. Continue paddle splint at night on the left if helpful.  Scar massage encouraged. I have provided him documentation to return to work 12/20/15. He does want to return on 12/19/15 for reevaluation. We will wait  until he has made a full recovery on the left before discussing surgical release on the right carpal tunnel, in the interim he can continue to splint at night.      Orders Next Visit:

## 2015-12-11 ENCOUNTER — Ambulatory Visit: Payer: Self-pay | Admitting: Rehabilitative and Restorative Service Providers"

## 2015-12-11 DIAGNOSIS — M65332 Trigger finger, left middle finger: Secondary | ICD-10-CM

## 2015-12-11 DIAGNOSIS — G5603 Carpal tunnel syndrome, bilateral upper limbs: Secondary | ICD-10-CM

## 2015-12-11 NOTE — Progress Notes (Signed)
Hand Rehab Daily Note    [facility]   [id number]         Subjective:  Pain Score: 2  Pain: "better every day"   He is pleased with the overall improvement in his ROM, still using nighttime extension splint      Objective:  ROM - Improved, full flexion/extension today  Strength - Functional for light ADL's  Function: - Improved, using his hand for basic self-care, some reaching  Education:  Updated HEP, Verbal cues for ther ex, Manual cues for ther ex    Treatment Modalities:  Desensitization, Functional/Therapeutic activites per flowsheet, Manual therapy,  Soft tissue Mobilization / Massage, Moist heat, Ther Exercise per flowsheet, Ultrasound  1MHz           Assessment:   Excellent gains every time  Looks good, scar tissue is smooth  ROM improving     Plan of Care:  Continue per Plan of care -  As written    Lenore Cordia, OT,CHT

## 2015-12-11 NOTE — Progress Notes (Signed)
[facility] OT CHARGE:   Therapeutic Exercise:  OT Code 6004 - CPT Code OT - 97110 - HR Therapeutic Exercise (8 - 22 min) x 1 unit  Therapeutic Activities functional:  OT Code 6319 - CPT Code OT - 97530 - HR Therapeutic Activities Functional (8 - 22 min) x 1 unit  Constant Attendance Modaliities:  OT CODE 6118 - CPT CODE OT - 97035 -  HR Ultrasound/phonoporesis with 10% HC @ (1/3 MHz pulsed/continuous)  Supervised Modalities:  OT CODE 6119 - CPT CODE OT - 97010 - HR Hot / Cold Packs

## 2015-12-11 NOTE — Progress Notes (Signed)
[facility] OT CHARGE:   Manual Therapy:  OT Code 6231 - CPT Code OT - [id number] - HR Manual Therapy (8 - 22 min) x 1 unit  Therapeutic Activities functional:  OT Code 6319 - CPT Code OT - [id number] - HR Therapeutic Activities Functional (8 - 22 min) x 1 unit  Constant Attendance Modaliities:  OT CODE 6118 - CPT CODE OT - [id number] -  HR Ultrasound/phonoporesis with 10% HC @ (1/3 MHz pulsed/continuous)  Supervised Modalities:  OT CODE 6119 - CPT CODE OT - 97010 - HR Hot / Cold Packs

## 2015-12-11 NOTE — Progress Notes (Signed)
Hand Rehab Daily Note    [facility]   [id number]         Subjective:  Pain Score: 2  Pain: "better every day"   He is pleased with the overall improvement in his ROM, still using nighttime extension splint    Pt is concerned about using tools and lifting tasks at work  I issued a carpal gel wrap for use at work, which he thinks will help        Objective:  ROM - Improved, full flexion/extension today  Strength - Functional for light ADL's  Function: - Improved, using his hand for basic self-care, some reaching  Education:  Updated HEP, Verbal cues for ther ex, Manual cues for ther ex    Treatment Modalities:  Desensitization, Functional/Therapeutic activites per flowsheet, Manual therapy,  Soft tissue Mobilization / Massage, Moist heat, Ther Exercise per flowsheet, Ultrasound  1MHz           Assessment:   Excellent gains every time  Looks good, scar tissue is smooth  ROM improving  Needs to start light strengthening next visit     Plan of Care:  Continue per Plan of care -  As written    Lenore Cordia, OT,CHT

## 2015-12-13 ENCOUNTER — Ambulatory Visit: Payer: Self-pay | Admitting: Rehabilitative and Restorative Service Providers"

## 2015-12-13 DIAGNOSIS — G5603 Carpal tunnel syndrome, bilateral upper limbs: Secondary | ICD-10-CM

## 2015-12-13 DIAGNOSIS — M65332 Trigger finger, left middle finger: Secondary | ICD-10-CM

## 2015-12-13 NOTE — Progress Notes (Signed)
[facility] OT CHARGE:   Therapeutic Exercise:  OT Code 6004 - CPT Code OT - 97110 - HR Therapeutic Exercise (23 - 37 min) x 2 units  Manual Therapy:  OT Code 6231 - CPT Code OT - 97140 - HR Manual Therapy (8 - 22 min) x 1 unit  Constant Attendance Modaliities:  OT CODE 6118 - CPT CODE OT - 97035 -  HR Ultrasound/phonoporesis with 10% HC @ (1/3 MHz pulsed/continuous)  Supervised Modalities:  OT CODE 6119 - CPT CODE OT - 97010 - HR Hot / Cold Packs

## 2015-12-13 NOTE — Progress Notes (Signed)
Hand Rehab Daily Note    [facility]   [id number]         Subjective:  Pain Score: 2  Pain: "better every day"   He is pleased with the gel padded device I gave him last time  "I think this will really help me"        Objective:  ROM - Improved, full flexion/extension today  Strength - Functional for light ADL's  Function: - Improved, using his hand for basic self-care, some reaching  Education:  Updated HEP, Verbal cues for ther ex, Manual cues for ther ex    Treatment Modalities:  Desensitization, Functional/Therapeutic activites per flowsheet, Manual therapy,  Soft tissue Mobilization / Massage, Moist heat, Ther Exercise per flowsheet, Ultrasound  1MHz     HEP with putty instruction today      Assessment:   Excellent gains every time  Looks good, scar tissue is smooth  ROM improving       Plan of Care:  Continue per Plan of care -  As written    Lenore Cordia, OT,CHT

## 2015-12-14 NOTE — Telephone Encounter (Signed)
Patient calling to to reschedule BMP appointment.  Patient was calling in to verify when he should stop taking piroxicam (FELDENE) 20 MG capsule .  Last conversation he recalls should be about 10 days prior to procedure to stop taking. Patient can be reached at [phone].

## 2015-12-14 NOTE — Telephone Encounter (Signed)
Called and spoke to pt regarding Feldene. He said he will stop the med and supplement with tylenol at least 2 weeks before his colonoscopy date. He will remain on his ASA 81mg . Message forwarded to secretary to schedule, and to Dr. Scherrie Bateman for approval of timeframe.

## 2015-12-17 NOTE — Telephone Encounter (Signed)
LVM for pt that holding Feldene for 14 days prior to procedure is fine.  He should be getting a call to reschedule.

## 2015-12-17 NOTE — Telephone Encounter (Signed)
Returned patient call to reschedule COB for 01/21/2016 @ [place] arrival time 12:45 pm. Confirmed with patient.  Mailed prep instructions.

## 2015-12-18 ENCOUNTER — Ambulatory Visit: Payer: Self-pay | Admitting: Rehabilitative and Restorative Service Providers"

## 2015-12-18 ENCOUNTER — Telehealth: Payer: Self-pay

## 2015-12-18 DIAGNOSIS — M65332 Trigger finger, left middle finger: Secondary | ICD-10-CM

## 2015-12-18 DIAGNOSIS — G5603 Carpal tunnel syndrome, bilateral upper limbs: Secondary | ICD-10-CM

## 2015-12-18 NOTE — Progress Notes (Signed)
[facility] OT CHARGE:   Therapeutic Exercise:  OT Code 6004 - CPT Code OT - 97110 - HR Therapeutic Exercise (8 - 22 min) x 1 unit  Manual Therapy:  OT Code 6231 - CPT Code OT - 97140 - HR Manual Therapy (8 - 22 min) x 1 unit  Therapeutic Activities functional:  OT Code 6319 - CPT Code OT - [id number] - HR Therapeutic Activities Functional (23 - 37 min) x 2 units  Constant Attendance Modaliities:  OT CODE 6118 - CPT CODE OT - [id number] -  HR Ultrasound/phonoporesis with 10% HC @ (1/3 MHz pulsed/continuous)  Supervised Modalities:  OT CODE 6119 - CPT CODE OT - 97010 - HR Hot / Cold Packs

## 2015-12-18 NOTE — Telephone Encounter (Signed)
Called patient. Scheduled bumped COB for 01/25/2016 @ 12:45pm at [place]. Patient confirmed. Patient has prep.

## 2015-12-18 NOTE — Progress Notes (Signed)
Hand Rehab Daily Note    [facility]   [id number]         Subjective:  Pain Score: 2  Pain: "better every day"   He is pleased with the gel padded device   "I think this will really help me get back to work"        Objective:  ROM - Improved, full flexion/extension today after MH and stretch  Strength - Functional for all ADL's, light activities around the house and yard  Function: - Improved, using his hand for basic self-care, some reaching  Education:  Updated HEP, Verbal cues for ther ex, Manual cues for ther ex    Treatment Modalities:  Desensitization, Functional/Therapeutic activites per flowsheet, Manual therapy,  Soft tissue Mobilization / Massage, Moist heat, Ther Exercise per flowsheet, Ultrasound  1MHz     HEP with instruction for 2# wrist curls, sup/pro, grip      Assessment:   Excellent gains every time  Looks good, scar tissue is smooth  ROM and tolerance to activity steadily improving       Plan of Care:  Continue per Plan of care -  As written    Len[facility] Cordia, OT,CHT

## 2015-12-19 ENCOUNTER — Ambulatory Visit: Payer: Self-pay | Admitting: Otolaryngology

## 2015-12-19 ENCOUNTER — Encounter: Payer: Self-pay | Admitting: Otolaryngology

## 2015-12-19 VITALS — BP 150/79 | Ht 70.08 in | Wt 200.0 lb

## 2015-12-19 DIAGNOSIS — M65332 Trigger finger, left middle finger: Secondary | ICD-10-CM

## 2015-12-19 DIAGNOSIS — G5603 Carpal tunnel syndrome, bilateral upper limbs: Secondary | ICD-10-CM

## 2015-12-19 NOTE — Progress Notes (Signed)
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 This is not covered under a  Barista. Pain is minimal.  Pre-operative paresthesias have resolved.  Pre-operative electrodiagnostics reveal mild median neuropathies at both wrists with a nonlocalizeable right ulnar neuropathy involving the digital sensory branch to the small finger. His left middle finger is still stiff although ROM is improving. He has been participating in formal PT and HEP at [facility] since our last visit strength is improving. He finds the Smart Glove to be comfortable. He is wearing nighttime extension splint. His leave from work has been approved through his employer through 12/19/15 and he would like to return tomorrow.     Objective:    Vital Signs:   BP 150/79   Ht 1.78 m (5' 10.08")   Wt 90.7 kg (200 lb)   BMI 28.63 kg/m2 Alert and oriented x 3. No acute distress.   Skin is without rashes, clean and intact. Surgical wounds well healed. Scar with mild hypertrophy.  He is able to make a full fist, he is able to do thumbs up, ok sign, he is able to oppose thumb to all but small finger.Sensation intact, good capillary refill. Normal nail growth, no dupuytren's.     Assessment/ Plan:    1. Bilateral mild carpal tunnel syndrome, s/p left release 11/07/2015  2. Left middle trigger finger s/p release 11/07/2015    Satisfactory initial postop course.  Continue with HEP and formal PT. Smart Glove during the day and extension splint at night if helpful.  Scar massage encouraged. I have provided him documentation to return to work 12/20/15 without restrictions. He would like to  schedule surgical release of the left carpal tunnel in the Spring. He will return when he is ready, in the interim he can continue to splint at night.      Orders Next Visit:

## 2015-12-25 NOTE — Telephone Encounter (Signed)
Called and spoke to pt. He will hold his FELDENE 2 weeks prior to 01/25/16 appointment with Dr. Scherrie Bateman at 1330, arrival 1245.

## 2015-12-27 ENCOUNTER — Ambulatory Visit: Payer: Self-pay | Admitting: Rehabilitative and Restorative Service Providers"

## 2015-12-27 DIAGNOSIS — G5603 Carpal tunnel syndrome, bilateral upper limbs: Secondary | ICD-10-CM

## 2015-12-27 DIAGNOSIS — M65332 Trigger finger, left middle finger: Secondary | ICD-10-CM

## 2015-12-27 NOTE — Progress Notes (Signed)
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, as he is a Librarian, academic  "It's really weak"        Objective:  ROM - Improved, full flexion/extension today after MH and stretch  Strength - Functional for all ADL's, light activities around the house and yard  Function: - Improved, using his hand for basic self-care, some reaching  Education:  Updated HEP, Verbal cues for ther ex, Manual cues for ther ex    Treatment Modalities:  Desensitization, Functional/Therapeutic activites per flowsheet, Manual therapy,  Soft tissue Mobilization / Massage, Moist heat, Ther Exercise per flowsheet, Ultrasound  1MHz     HEP with instruction for 2# wrist curls, sup/pro, grip      Assessment:   Excellent gains every time  Looks good, scar tissue is smooth  ROM and tolerance to activity steadily improving       Plan of Care:  Continue per Plan of care -  As written    [facility] Cordia, OT,CHT

## 2015-12-27 NOTE — Progress Notes (Signed)
[facility] HAND REHAB OT CHARGE:   Manual Therapy:  OT Code 6231 - CPT Code OT - [id number] - HR Manual Therapy (8 - 22 min) x 1 unit  Therapeutic Activities functional:  OT Code 6319 - CPT Code OT - [id number] - HR Therapeutic Activities Functional (23 - 37 min) x 2 units  Constant Attendance Modaliities:  OT CODE 6118 - CPT CODE OT - [id number] -  HR Ultrasound/phonoporesis with 10% HC @ (1/3 MHz pulsed/continuous)  Supervised Modalities:  OT CODE 6119 - CPT CODE OT - 97010 - HR Hot / Cold Packs

## 2015-12-28 ENCOUNTER — Other Ambulatory Visit: Payer: Self-pay | Admitting: Gastroenterology

## 2016-01-01 ENCOUNTER — Ambulatory Visit: Payer: Self-pay | Admitting: Rehabilitative and Restorative Service Providers"

## 2016-01-03 ENCOUNTER — Ambulatory Visit: Payer: Self-pay | Admitting: Rehabilitative and Restorative Service Providers"

## 2016-01-03 DIAGNOSIS — M65332 Trigger finger, left middle finger: Secondary | ICD-10-CM

## 2016-01-03 DIAGNOSIS — G5603 Carpal tunnel syndrome, bilateral upper limbs: Secondary | ICD-10-CM

## 2016-01-03 NOTE — Progress Notes (Signed)
[facility] OT CHARGE:   Therapeutic Exercise:  OT Code 6004 - CPT Code OT - 97110 - HR Therapeutic Exercise (23 - 37 min) x 2 units  Manual Therapy:  OT Code 6231 - CPT Code OT - 97140 - HR Manual Therapy (8 - 22 min) x 1 unit  Constant Attendance Modaliities:  OT CODE 6118 - CPT CODE OT - 97035 -  HR Ultrasound/phonoporesis with 10% HC @ (1/3 MHz pulsed/continuous)  Supervised Modalities:  OT CODE 6119 - CPT CODE OT - 97010 - HR Hot / Cold Packs

## 2016-01-03 NOTE — Progress Notes (Signed)
Hand Rehab Daily Note    [facility]   [id number]         Subjective:  Pain Score: 2  Pain: having trouble with lifting, tools at work  Otherwise he's ok  Using gel padded wrist wrap      Objective:  ROM - Improved, full flexion/extension today after MH and stretch  Strength - Functional for all ADL's, light activities around the house and yard  Function: - Improved, using his hand for basic self-care, some reaching  Education:  Updated HEP, Verbal cues for ther ex, Manual cues for ther ex    Treatment Modalities:  Desensitization, Functional/Therapeutic activites per flowsheet, Manual therapy,  Soft tissue Mobilization / Massage, Moist heat, Ther Exercise per flowsheet, Ultrasound  1MHz     HEP with instruction for green T-band for wrist, elbow, shoulder      Assessment:   Excellent gains every time  Looks good, scar tissue is smooth  ROM and tolerance to activity steadily improving       Plan of Care:  Continue per Plan of care -  As written    Len[facility] Cordia, OT,CHT

## 2016-01-04 ENCOUNTER — Other Ambulatory Visit: Payer: Self-pay | Admitting: Primary Care

## 2016-01-17 ENCOUNTER — Ambulatory Visit: Payer: Self-pay | Admitting: Rehabilitative and Restorative Service Providers"

## 2016-01-17 DIAGNOSIS — G5603 Carpal tunnel syndrome, bilateral upper limbs: Secondary | ICD-10-CM

## 2016-01-17 DIAGNOSIS — M65332 Trigger finger, left middle finger: Secondary | ICD-10-CM

## 2016-01-17 NOTE — Progress Notes (Signed)
[facility]  OT Discharge Summary      Duane Sleight  [id number]      SUMMARY OF TREATMENTS:  Received care from 11/23/2015 to today for 10  total visits.  Attendance:  Good    Compliance:  Good     The treatment(s) included:  Home program instruction, Therapeutic exercise (ROM/flexibility/strength), Functional progression, Therapeutic activities dynamic stabilization, Manual therapy, Ice/heat, Ultrasound, Orthotics    Treatment Goals:  Achieved  Range of Motion/Flexibility:  Achieved  Strength/Motor Performance:  Achieved  Grip 62# left, 85# right  Functional Recovery:  Achieved  Work ADL's:  Achieved  Pain Control:  Achieved  Home Program:  Achieved  Other:  NA    REASON FOR DISCHARGE:  Patient had maximum improvement with rehab   Patient discharged to HEP to achieve secondary goals   Pt welcome to return at future time if appropriate/indicated    Prognosis at time of discharge:  Excellent  Comments:  NA    Thank you for the referral of this patient to [facility]. Please do not hesitate to contact me if I may be of assistance.    Lenore Cordia, OT,CHT

## 2016-01-17 NOTE — Progress Notes (Signed)
[facility] HAND REHAB OT CHARGE:   Therapeutic Exercise:  OT Code 6004 - CPT Code OT - 97110 - HR Therapeutic Exercise (23 - 37 min) x 2 units  Manual Therapy:  OT Code 6231 - CPT Code OT - 97140 - HR Manual Therapy (8 - 22 min) x 1 unit  Constant Attendance Modaliities:  OT CODE 6118 - CPT CODE OT - [id number] -  HR Ultrasound/phonoporesis with 10% HC @ (1/3 MHz pulsed/continuous)  Supervised Modalities:  OT CODE 6119 - CPT CODE OT - 97010 - HR Hot / Cold Packs  Status/POC:  Discharged

## 2016-01-21 ENCOUNTER — Other Ambulatory Visit: Payer: Self-pay | Admitting: Gastroenterology

## 2016-01-25 ENCOUNTER — Ambulatory Visit
Admission: RE | Admit: 2016-01-25 | Discharge: 2016-01-25 | Disposition: A | Discharge status: Home / Self Care | Payer: Self-pay | Attending: Gastroenterology | Admitting: Gastroenterology

## 2016-01-25 ENCOUNTER — Encounter: Payer: Self-pay | Admitting: Gastroenterology

## 2016-01-25 MED ORDER — ATROPINE SULFATE 0.1 MG/ML IJ SOLN *I*
INTRAMUSCULAR | Status: AC
Start: 2016-01-25 — End: 2016-01-25
  Filled 2016-01-25: qty 10

## 2016-01-25 MED ORDER — FLUMAZENIL 0.1 MG/ML IV SOLN *WRAPPED*
INTRAVENOUS | Status: AC
Start: 2016-01-25 — End: 2016-01-25
  Filled 2016-01-25: qty 5

## 2016-01-25 MED ORDER — NALOXONE HCL 0.4 MG/ML IJ SOLN *WRAPPED*
Status: AC
Start: 2016-01-25 — End: 2016-01-25
  Filled 2016-01-25: qty 1

## 2016-01-25 MED ORDER — EPINEPHRINE HCL 0.1 MG/ML IJ SOLUTION *WRAPPED*
INTRAMUSCULAR | Status: AC
Start: 2016-01-25 — End: 2016-01-25
  Filled 2016-01-25: qty 10

## 2016-01-25 MED ORDER — MIDAZOLAM HCL 5 MG/5ML IJ SOLN *I*
INTRAMUSCULAR | Status: AC
Start: 2016-01-25 — End: 2016-01-25
  Filled 2016-01-25: qty 10

## 2016-01-25 MED ORDER — SODIUM CHLORIDE 0.9 % IV SOLN WRAPPED *I*
100.0000 mL/h | Status: DC
Start: 2016-01-25 — End: 2016-01-26
  Administered 2016-01-25: 100 mL/h via INTRAVENOUS

## 2016-01-25 MED ORDER — MIDAZOLAM HCL 5 MG/5ML IJ SOLN *I*
INTRAMUSCULAR | Status: AC | PRN
Start: 2016-01-25 — End: 2016-01-25
  Administered 2016-01-25 (×2): 2 mg via INTRAVENOUS

## 2016-01-25 MED ORDER — FENTANYL CITRATE 50 MCG/ML IJ SOLN *WRAPPED*
INTRAMUSCULAR | Status: AC
Start: 2016-01-25 — End: 2016-01-25
  Filled 2016-01-25: qty 4

## 2016-01-25 MED ORDER — FENTANYL CITRATE 50 MCG/ML IJ SOLN *WRAPPED*
INTRAMUSCULAR | Status: AC | PRN
Start: 2016-01-25 — End: 2016-01-25
  Administered 2016-01-25: 13:00:00 25 ug via INTRAVENOUS
  Administered 2016-01-25: 13:00:00 50 ug via INTRAVENOUS

## 2016-01-25 NOTE — Procedures (Addendum)
Colonoscopy Procedure Note   Date of Procedure: 01/25/2016   Referring Physician: Penni Bombard, MD  Primary Physician: Penni Bombard, MD        Attending Physician: Levada Dy, MD  Fellow:   Renato Gails, MD  Indications:  Surveillance for history of colonic polyps  Previous colonoscopy: Yes -- Date: 10/2015  Medications: Versed 4mg , Fentanyl 8mcg were administered incrementally over the course of the procedure to achieve an adequate level of conscious sedation.    Moderate Sedation Face Times  Start Time: [id number]  End Time: 1358  Duration (minutes): 54 Minutes        Scopes: [id number]    Procedure Details: Full disclosures of risks were reviewed with patient as detailed on the consent form. The patient was placed in the left lateral decubitus position and monitored with continuous pulse oximetry, interval blood pressure monitoring and direct observations.   After anorectal examination was performed, the colonoscope was inserted into the rectum and advanced under direct vision to the terminal ileum. Retroflexion was performed in the right  Colon. The procedure was considered not difficult.  During withdrawal examination, the final quality of the prep was [facility] Bowel Prep Scale:  Right Colon: Grade3- (entire mucosa of colon segment seen well, with no residual staining, small fragments of stool, or opaque liquid)  Transverse Colon: Grade 3- (entire mucosa of colon segment seen well, with no residual staining, small fragments of stool, or opaque liquid)  Left Colon: Grade 3- (entire mucosa of colon segment seen well, with no residual staining, small fragments of stool, or opaque liquid)  A careful inspection was made as the colonoscope was withdrawn. A retroflexed view of the rectum was performed; findings and interventions are described below. The patient was recovered in the GI recovery area.     Scope Times:     Insertion Time: 1310  Cecum Time: [id number]  Exit Time: [id number]  Findings:    Anorectal:   Normal tone,  no masses   Large skin tag  Terminal Ileum:   Normal ileal mucosa  Cecum:   1 polyp(s) 2 mm in size,  sessile, removed by cold snare and retrieved and sent to pathology   isolated small cecal polyp.  Ascending Colon:   1 polyp(s) 5 mm in size,  sessile, this was raised with saline injection, removed by snare cautery and retrieved and sent to pathology. Clip placed for hemorrhage prophylaxis    Diverticulosis: rare, small in size .    Transverse Colon:   1 polyp(s) 3 mm in size,  sessile, removed by cold snare and retrieved and sent to pathology      At site of previous polypectomy scar, small remnant of polyp tissue, 55mm, was removed with cold snare and sent to pathology for review.   Descending Colon:   Diverticulosis: rare, small in size  Sigmoid:   Diverticulosis: rare, small in size    Rectum:   Few hyperplastic appearing polyps, 1-60mm in size, removed 2 and sent to pathology.      Intervention(s):      3 polyp(s) removed by cold snare biopsy   1 polyp(s) removed by hot snare biopsy   2 polyp(s) removed by jumbo biopsy   Hemorrhage control/prevention: Clipping: 1 clip(s) placed, location: ascending colon polyp site   Submucosal injection: Saline lift      Complication(s):     none    Impression(s):     Multiple polyps as removed above.   Scattered, small diverticulosis, greater in  left colon   Otherwise normal colonoscopy to the terminal ileum.    Recommendation(s):     Await pathology.   Repeat colonoscopy in 3 years.   Follow up with primary care physician.    Histopathologic Diagnosis:  pending    Scherry Ran., MD     GI ATTENDING    I was present for the entire viewing portion of the exam including moderate sedation and participated as needed throughout the procedure, and I concur with the findings and intervention descriptions as outlined in this edited note.    Neomia Dear, M.D.

## 2016-01-25 NOTE — Preop H&P (Signed)
OUTPATIENT PRE-PROCEDURE H&P    Chief Complaint / Indications for Procedure:    10/12/2015 visualization inadequate    Past Medical History:     Past Medical History:   Diagnosis Date    Arthritis     elbows and shoulders     Chest pain, unspecified     Depression 05/23/2014    Neuromuscular disorder     elbows, shoulders    Unspecified essential hypertension 06/02/2013     No past surgical history on file.  Family History   Problem Relation Age of Onset    Colon polyps Mother     Cancer Mother     Colon cancer Neg Hx      Social History     Social History    Marital status: Married     Spouse name: N/A    Number of children: N/A    Years of education: N/A     Social History Main Topics    Smoking status: Never Smoker    Smokeless tobacco: Never Used    Alcohol use 0.6 oz/week     1 Cans of beer per week    Drug use: No    Sexual activity: Not on file     Other Topics Concern    Not on file     Social History Narrative       Allergies:    Allergies   Allergen Reactions    Penicillins      rash       Medications:    (Not in a hospital admission)    Current Outpatient Prescriptions   Medication    benazepril (LOTENSIN) 10 MG tablet    amLODIPine (NORVASC) 10 MG tablet    HYDROcodone-acetaminophen (NORCO) 5-325 MG per tablet    sertraline (ZOLOFT) 100 MG tablet    piroxicam (FELDENE) 20 MG capsule    atorvastatin (LIPITOR) 20 MG tablet    aspirin 81 MG tablet     Current Facility-Administered Medications   Medication Dose Route Frequency    sodium chloride 0.9 % IV  100 mL/hr Intravenous Continuous     There were no vitals filed for this visit.    ROS -negative     Physical Examination:  Head/Nose/Throat:negative  Lungs:Lungs clear  Cardiovascular:normal S1 and S2  Abdomen: abdomen soft, non-tender, nondistended, normal active bowel sounds, no masses or organomegaly      Lab Results: none    Radiology impressions (last 30 days):  No results found.    Currently Active Problems:  Patient Active  Problem List   Diagnosis Code    Knee pain M25.569    Shoulder pain M25.519    Impaired fasting glucose R73.01    Chest pain R07.9    Essential hypertension I10    Hyperlipidemia E78.5    Arthritis M19.90    Depression F32.9    Primary osteoarthritis of both knees, patellar>medial M17.0    Bilateral carpal tunnel syndrome G56.03    Ulnar neuropathy of right upper extremity G56.21        Impression:  Surveillance colonoscopy    Plan:  Colonoscopy  Moderate Sedation    UPDATES TO PATIENT'S CONDITION on the DAY OF SURGERY/PROCEDURE    I. Updates to Patient's Condition (to be completed by a provider privileged to complete a H&P, following reassessment of the patient by the provider):    Full H&P done today; no updates needed.    II. Procedure Readiness   I have reviewed the patient's  H&P and updated condition. By completing and signing this form, I attest that this patient is ready for surgery/procedure.      III. Attestation   I have reviewed the updated information regarding the patient's condition and it is appropriate to proceed with the planned surgery/procedure.    Scherry Ran., MD as of 12:40 PM 01/25/2016

## 2016-01-25 NOTE — Discharge Instructions (Signed)
Gastroenterology Unit  Discharge Instructions for Colonoscopy      01/25/2016    2:14 PM    Colonoscopy and Polyp(s) Removed hemoclip    Do not drive, operate heavy machinery, drink alcoholic beverages, make important personal or business decisions, or sign legal documents until the next day.      Return to your usual diet   Return to taking your usual medications    Things you may expect:   A small amount of bright red blood in your stool   It may be a few days before you have a bowel movement   You may have cramping, bloating, and feelings of "gas". These feelings should go away as you pass gas. If you still feel uncomfortable, walking around will help to pass the gas.   You were given medication to help you relax during the test. You may feel "fuzzy" and drowsy. Go home and rest for at least 4-6 hours.    You should call your doctor for any of the following:   Bad stomach pain   Fever   Bright red bleeding or clots (This may happen up to 20 days after the test.)   Dizziness or weakness that gets worse or lasts up to 24 hours.   Pain or redness at the IV site    If you have a serious problem after hours, Call [phone] to reach the GI physician on call. If you are unable to reach your doctor, go to the [facility] Emergency Department.    Follow Up Care:   If biopsies were taken during your procedure, we will send you the pathology results within 7-10 days. If you do not receive your pathology results after 10 days please call [phone]    New Prescriptions    No medications on file

## 2016-01-28 ENCOUNTER — Telehealth: Payer: Self-pay

## 2016-01-28 NOTE — Telephone Encounter (Signed)
Follow up call made.  Spoke with pt.  Pt doing well.

## 2016-01-30 LAB — SURGICAL PATHOLOGY

## 2016-02-14 ENCOUNTER — Telehealth: Payer: Self-pay | Admitting: Primary Care

## 2016-02-14 NOTE — Telephone Encounter (Signed)
Advised pt;   -Just watch for redness/bull's-eye rash, come into the office, or go to urgent care, if any such rash recurs.

## 2016-02-14 NOTE — Telephone Encounter (Signed)
Just watch for redness/bull's-eye rash, come into the office, or go to urgent care, if any such rash recurs.

## 2016-02-14 NOTE — Telephone Encounter (Signed)
Left message for pt to call the office.

## 2016-02-14 NOTE — Telephone Encounter (Signed)
Pt removed a tick roughly 6 days ago from himself, he believes the tick was on him for about 30 hours. He removed the tick completely and denies any bulls eye rash or redness of any kind in the area. Wonders if he needs to do anything more? Please advise.

## 2016-02-22 ENCOUNTER — Other Ambulatory Visit
Admission: RE | Admit: 2016-02-22 | Discharge: 2016-02-22 | Disposition: A | Discharge status: Home / Self Care | Payer: Self-pay | Source: Ambulatory Visit | Attending: Primary Care | Admitting: Primary Care

## 2016-02-22 DIAGNOSIS — R7301 Impaired fasting glucose: Secondary | ICD-10-CM

## 2016-02-22 DIAGNOSIS — E785 Hyperlipidemia, unspecified: Secondary | ICD-10-CM

## 2016-02-22 LAB — BASIC METABOLIC PANEL
Anion Gap: 12 (ref 7–16)
CO2: 27 mmol/L (ref 20–28)
Calcium: 9.4 mg/dL (ref 8.6–10.2)
Chloride: 102 mmol/L (ref 96–108)
Creatinine: 1.21 mg/dL — ABNORMAL HIGH (ref 0.67–1.17)
GFR,Black: 76 *
GFR,Caucasian: 66 *
Glucose: 104 mg/dL — ABNORMAL HIGH (ref 60–99)
Lab: 17 mg/dL (ref 6–20)
Potassium: 4.5 mmol/L (ref 3.3–5.1)
Sodium: 141 mmol/L (ref 133–145)

## 2016-02-22 LAB — LIPID PANEL
Chol/HDL Ratio: 3.7
Cholesterol: 153 mg/dL
HDL: 41 mg/dL
LDL Calculated: 74 mg/dL
Non HDL Cholesterol: 112 mg/dL
Triglycerides: 192 mg/dL — AB

## 2016-02-23 LAB — HEMOGLOBIN A1C: Hemoglobin A1C: 6.8 % — ABNORMAL HIGH (ref 4.0–6.0)

## 2016-02-29 ENCOUNTER — Other Ambulatory Visit: Payer: Self-pay | Admitting: Primary Care

## 2016-03-01 NOTE — Progress Notes (Signed)
Pathologist msg'ed.

## 2016-03-10 ENCOUNTER — Encounter: Payer: Self-pay | Admitting: Gastroenterology

## 2016-03-12 NOTE — Progress Notes (Signed)
Discussed results.  Elastofibroma benign.  1 TA and 2 HP proximal to sigmoid, favor 3 year repeat.  He understands, appreciated the call.

## 2016-03-19 ENCOUNTER — Encounter: Payer: Self-pay | Admitting: Primary Care

## 2016-03-19 ENCOUNTER — Ambulatory Visit: Payer: Self-pay | Admitting: Primary Care

## 2016-03-19 VITALS — BP 130/80 | HR 58 | Ht 70.0 in | Wt 203.0 lb

## 2016-03-19 DIAGNOSIS — I10 Essential (primary) hypertension: Secondary | ICD-10-CM

## 2016-03-19 DIAGNOSIS — E782 Mixed hyperlipidemia: Secondary | ICD-10-CM

## 2016-03-19 DIAGNOSIS — R7301 Impaired fasting glucose: Secondary | ICD-10-CM

## 2016-03-19 DIAGNOSIS — W57XXXA Bitten or stung by nonvenomous insect and other nonvenomous arthropods, initial encounter: Secondary | ICD-10-CM
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
## 2016-04-10 ENCOUNTER — Ambulatory Visit: Payer: Self-pay | Admitting: Primary Care

## 2016-04-10 ENCOUNTER — Encounter: Payer: Self-pay | Admitting: Primary Care

## 2016-04-10 VITALS — BP 130/80 | HR 70 | Temp 98.5°F | Wt 192.0 lb

## 2016-04-10 DIAGNOSIS — J0100 Acute maxillary sinusitis, unspecified: Secondary | ICD-10-CM

## 2016-04-10 MED ORDER — AZITHROMYCIN 250 MG PO TABS *I*
ORAL_TABLET | ORAL | 0 refills | Status: AC
Start: 2016-04-10 — End: 2016-04-15

## 2016-04-10 NOTE — Progress Notes (Signed)
Subjective:       Hildreth Huntoon is a 56 y.o. male who presents for evaluation of bilateral ear pressure/pain, facial pain, nasal congestion, productive cough with  yellow and green colored sputum and sinus pressure. Onset of symptoms was 5 days ago, and has been gradually worsening since that time. Treatment to date: analgesics, antihistamines, cough suppressants and decongestants.    Patient's medications, allergies reviewed and updated as appropriate.    Review of Systems  Pertinent items are noted in HPI.     Objective:      General appearance: alert and cooperative  Head: sinuses tender to percussion  Ears: normal TM's and external ear canals both ears  Throat: lips, mucosa, and tongue normal; teeth and gums normal  Lungs: clear to auscultation bilaterally     Assessment:      sinusitis     Plan:      Discussed diagnosis and treatment of URI.  given duration and symptoms, will treat with antibiotic, advise Claritin 10 mg daily and Nyquill, call back if no improvement in 5 days.

## 2016-04-15 ENCOUNTER — Encounter: Payer: Self-pay | Admitting: Sports Medicine

## 2016-04-15 ENCOUNTER — Ambulatory Visit: Payer: Self-pay | Admitting: Sports Medicine

## 2016-04-15 ENCOUNTER — Other Ambulatory Visit: Payer: Self-pay | Admitting: Sports Medicine

## 2016-04-15 VITALS — Ht 70.0 in | Wt 192.0 lb

## 2016-04-15 DIAGNOSIS — S83241A Other tear of medial meniscus, current injury, right knee, initial encounter: Secondary | ICD-10-CM

## 2016-04-15 DIAGNOSIS — M25561 Pain in right knee: Secondary | ICD-10-CM

## 2016-04-15 NOTE — Progress Notes (Signed)
Chief complaint: Worsening right knee pain over the past several months, primarily medial pain much worse than baseline    History of present illness: Patient is a 56 year old male we have not seen for several years.  A few years ago we saw him for his opposite knee.  Cortisone injection in his opposite knee was helpful back then.    Regarding his right knee, he has worsening over this over several months.  He has sharp medial pain ongoing and intermittent.  At times he has had difficulty weightbearing secondary to pain.    His right knee gets acutely irritated and stays sore for several days often over the past few months.    He is wondering whether he has meniscus tearing.  He points to the medial joint line as painful.  He also notes some swelling associated with this especially with flareups.    Continues with anti-inflammatories, which helped some but not returning to baseline.    Medical history: Patient has multiple musculoskeletal complaints including his shoulders, hands and knees.    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    Allergies   Allergen Reactions    Penicillins      rash     Past Medical History:   Diagnosis Date    Arthritis     elbows and shoulders     Chest pain, unspecified     Depression 05/23/2014    Neuromuscular disorder     elbows, shoulders    Unspecified essential hypertension 06/02/2013     Trace effusion.  Right Knee exam : Some medial joint line tenderness With palpation.  Positive medial McMurray's.  Collateral ligaments stable.  No obvious cruciate ligament laxity.  No groin pain with hip exam and no knee pain with right hip exam.  Extension full but with some posterior medial discomfort with full extension.  Flexion to 120 with some medial and posterior medial discomfort as well.    Imaging  Knee : Right knee 4 views: Show reasonable joint space preservation.  Mild arthritic changes but no acute bony injuries or severe arthritis  appreciated.    Impression/Plan: Right knee suspicious for medial meniscus tearing.  Ongoing sharp and intermittent medial pain over the past several months.      He has not returned to baseline with time and anti-inflammatories.    Recommend right knee MRI to assess meniscal status.  The meantime he will avoid aggravating factors.  Continue NSAIDs.    Follow-up after right knee MRI.        **This transcription was done using voice recognition.

## 2016-04-29 ENCOUNTER — Ambulatory Visit: Payer: Self-pay

## 2016-05-19 ENCOUNTER — Ambulatory Visit: Payer: Self-pay | Admitting: Sports Medicine

## 2016-05-19 ENCOUNTER — Encounter: Payer: Self-pay | Admitting: Sports Medicine

## 2016-05-19 VITALS — BP 130/72 | HR 69 | Ht 70.0 in | Wt 176.0 lb

## 2016-05-19 DIAGNOSIS — S83241D Other tear of medial meniscus, current injury, right knee, subsequent encounter: Secondary | ICD-10-CM

## 2016-05-19 NOTE — Progress Notes (Signed)
CHIEF COMPLAINT: Follow-up right knee    INTERVAL HISTORY: Patient returns for MRI review.  He continues to be bothered by sharp, mechanical pain along the medial aspect of his right knee.  MRI was performed.    PFSH:  Since last visit no change.    ROS:  Since last visit no change.    MEDICATION:  Since last visit no new meds.    ALLERGIES:  As per initial evaluation.    PHYSICAL EXAM: His right knee today does not show any increased warmth or erythema.  Small effusion.  Extension to 0.  Flexion to 110 with medial pain at maximal flexion.  No peripatellar tenderness.  Increased medial pain with circumduction maneuvers.  There is no catching or clicking noted.  Good motion of the right hip without any reproduction of his usual discomfort.    IMAGING: Dr. Noland Fordyce and I did review his MRI scan.  This does confirm a deep radial tear of the posterior horn of the medial meniscus.    ASSESSMENT: Symptomatic medial meniscus tear    PLAN: Dr. Noland Fordyce did see and examine the patient today as well.  Over 30 minutes was spent, the majority this was spent with education and counseling and reviewing the above.  Questions were invited and answered.  He does wish to proceed with arthroscopy and partial medial meniscectomy.  He shows good understanding that this will be removal of the torn portion of the meniscus rather than a repair.  He will contact our Network engineer for surgical scheduling.    ORDERS TODAY:    ORDERS NEXT VISIT:    PERCENT OF TEMPORARY IMPAIRMENT:

## 2016-05-21 NOTE — Progress Notes (Signed)
PATIENTLARRIE, PERIMAN  MR #:  [id number]   CSN:  [id number] DOB:  July 06, 1960   DICTATED BY:  Alwyn Pea, MD DATE OF VISIT:  05/19/2016     SUBJECTIVE:  Lance Peters is seen in followup for his right knee in conjunction with Lance Peters.  History, physical exam, MRI findings, assessment, and plan are as per his note.  He continues to have ongoing sharp mechanical medial knee pain that has not improved with nonoperative measures.  It has been going on for multiple months now.    PHYSICAL EXAMINATION:  There is trace effusion.  He has tenderness over the medial joint line, definite medial pain with medial McMurray maneuver.  No ligamentous instability.  Range of motion is from 0 to 110 degrees.    IMAGING:  His MRI is reviewed.  This shows a deep radial tear of the posterior third of the medial meniscus with gapping and obvious instability pattern.    IMPRESSION:  Mechanically significant medial meniscus tear associated with sharp mechanical medial symptoms, not improving with nonoperative measures.    PLAN:  He does wish to proceed with arthroscopy, partial medial meniscectomy.  Risks, benefits, treatment options, limitations reviewed in detail.  Questions invited and answered.  He will call regarding scheduling.       Over 30 minutes was spent today, with the majority in education, counseling, and reviewing the above.             ______________________________  Alwyn Pea, MD    LMR/MODL  DD:  05/21/2016 18:30:00  DT:  05/21/2016 18:37:52  Job #:  [id number]    cc:

## 2016-05-23 ENCOUNTER — Other Ambulatory Visit
Admission: RE | Admit: 2016-05-23 | Discharge: 2016-05-23 | Disposition: A | Discharge status: Home / Self Care | Payer: Self-pay | Source: Ambulatory Visit | Attending: Primary Care | Admitting: Primary Care

## 2016-05-23 DIAGNOSIS — W57XXXA Bitten or stung by nonvenomous insect and other nonvenomous arthropods, initial encounter: Secondary | ICD-10-CM

## 2016-05-23 DIAGNOSIS — I10 Essential (primary) hypertension: Secondary | ICD-10-CM

## 2016-05-23 DIAGNOSIS — R7301 Impaired fasting glucose: Secondary | ICD-10-CM

## 2016-05-23 LAB — LIPID PANEL
Chol/HDL Ratio: 3.8
Cholesterol: 176 mg/dL
HDL: 46 mg/dL
LDL Calculated: 104 mg/dL
Non HDL Cholesterol: 130 mg/dL
Triglycerides: 128 mg/dL

## 2016-05-23 LAB — HEMOGLOBIN A1C: Hemoglobin A1C: 5.5 % (ref 4.0–6.0)

## 2016-05-23 LAB — BASIC METABOLIC PANEL
Anion Gap: 12 (ref 7–16)
CO2: 29 mmol/L — ABNORMAL HIGH (ref 20–28)
Calcium: 10.2 mg/dL (ref 8.6–10.2)
Chloride: 101 mmol/L (ref 96–108)
Creatinine: 1.16 mg/dL (ref 0.67–1.17)
GFR,Black: 80 *
GFR,Caucasian: 69 *
Glucose: 121 mg/dL — ABNORMAL HIGH (ref 60–99)
Lab: 20 mg/dL (ref 6–20)
Potassium: 4.5 mmol/L (ref 3.3–5.1)
Sodium: 142 mmol/L (ref 133–145)

## 2016-05-26 LAB — LYME C6 PEPTIDE AB: Lyme C6 Peptide AB: NEGATIVE

## 2016-05-27 ENCOUNTER — Encounter: Payer: Self-pay | Admitting: Primary Care

## 2016-05-29 ENCOUNTER — Other Ambulatory Visit: Payer: Self-pay | Admitting: Primary Care

## 2016-07-02 ENCOUNTER — Other Ambulatory Visit: Payer: Self-pay | Admitting: Primary Care

## 2016-07-10 NOTE — OR PreOp (Signed)
Prescreened & instructed.    meds DOS: amlodipine, sertraline. HOLD vits/supp/nsaids/asa x5 days preop

## 2016-07-15 NOTE — Anesthesia Preprocedure Evaluation (Addendum)
Anesthesia Pre-operative History and Physical for [name]    Highlighted Issues for this Procedure:  56 y.o. male with Tear of medial meniscus of knee (W26.378H) presenting for Procedure(s):  KNEE ARTHROSCOPY WITH PARTIAL MEDIAL MENISCECTOMY by Surgeon(s):  Jannifer Franklin, MD scheduled for 75 minutes.    Wadell Craddock is a 56 y.o. with good general health that was referred for assessment of his chest pain.       Evaluated by cardiology in 2014 for Chest pains: "Huel showed very good exercise tolerance today and did have recurrence of his chest discomfort.  However, there was no evidence of ischemia on EKG or echo images.  Symptoms are consistent with angina, but may be reflective of microvascular disease, hypertensive response, or small vessel ischemia.  Given the benign study and low ACC/AHA 10-year risk (6%), will try treating non-cardiac causes.  If symptoms persist, will consider angiography." Discussed with Dr. Ulysees Barns- patient is appropriate for [place].       Stress Test/Echocardiography:  Exercise echocardiogram (09/27/12):    1.  Mild left ventricular hypertrophy with normal systolic function.  2.  No significant valvular abnormalities.    3.  Left atrial enlargement.  4.  No evidence of stress-induced ECG changes or inducible arrhythmias.    5.  Good exercise tolerance with good augmentation of LV function and no evidence of stress-induced regional wall motion abnormalities.    6.  Reproduction of symptoms, but no objective evidence of inducible ischemia at an acceptable cardiac workload.    Electrophysiology/AICD/Pacer:  EKG (09/22/12): NSR at 56 BPM, normal axis/intervals, no AE, no VH, no pathologic QW or ST deviation    .  Anesthesia Evaluation Information Source: records, patient     ANESTHESIA HISTORY     Denies anesthesia history    GENERAL  Pertinent (-):  No obesity    HEENT    + Visual Impairment          corrective lens for ADL  Pertinent (-):  No TMJD or neck pain PULMONARY  Pertinent(-):  No  smoking, asthma, recent URI, cough/congestion or sleep apnea    CARDIOVASCULAR    + Hypertension (takes benazepril and norvasc)          held ARB/ACEI    + Cardiac Testing (June 2014, normal)          stress echo  Pertinent(-):  No angina or orthopnea    GI/HEPATIC/RENAL  Last PO Intake: >8hr before procedure    + Alcohol use          >2 drinks/day    + Renal Issues          GFR<90  Pertinent(-):  No GERD NEURO/PSYCH    + Psychiatric Issues          depression  Pertinent(-):  No seizures or cerebrovascular event    ENDO/OTHER    + Diabetes Mellitus (resloved after 40lb weight loss, HbA1c 5.5 %)  Pertinent(-):  No thyroid disease    HEMATOLOGIC    + Anticoagulants/Antiplatelets (81 mg)          ASA    + Blood dyscrasia          hyperlipidemia    + Arthritis       Physical Exam    Airway            Mouth opening: normal            Mallampati: II  TM distance (fb): >3 FB            Neck ROM: full  Dental        Comment: Permanent bridge with some caps. Scattered fillings and crowns throughout. No loose or broken teeth.    Cardiovascular  Normal Exam           Rhythm: regular           Rate: normal      Neurologic    Normal Exam    General Survey    Normal Exam   Pulmonary   Normal Exam    breath sounds clear to auscultation    Mental Status   Normal Exam    oriented to person, place and time         ________________________________________________________________________  PLAN  ASA Score  2  Anesthetic Plan general     Induction (routine IV) General Anesthesia/Sedation Maintenance Plan (inhaled agents); Airway (LMA); Line ( use current access); Monitoring (standard ASA); Positioning (supine); PONV Plan (dexamethasone); Pain (per surgical team and intraop local); PostOp (PACU)    Informed Consent     Risks:          Risks discussed were commensurate with the plan listed above with the following specific points: N/V, aspiration, sore throat and hypotension , damage to:(eyes, teeth), allergic Rx, unexpected  serious injury, awareness    Anesthetic Consent:         Anesthetic plan (and risks as noted above) were discussed with patient and spouse    Plan also discussed with team members including:       CRNA    Attending Attestation:  As the primary attending anesthesiologist, I attest that the patient or proxy understands and accepts the risks and benefits of the anesthesia plan. I also attest that I have personally performed a pre-anesthetic examination and evaluation, and prescribed the anesthetic plan for this particular location within 48 hours prior to the anesthetic as documented. Gwenith Spitz, MD 11:27 AM

## 2016-07-16 ENCOUNTER — Encounter: Payer: Self-pay | Admitting: Gastroenterology

## 2016-07-16 ENCOUNTER — Encounter
Admission: RE | Disposition: A | Discharge status: Home / Self Care | Payer: Self-pay | Source: Ambulatory Visit | Attending: Sports Medicine

## 2016-07-16 ENCOUNTER — Encounter: Payer: BLUE CROSS/BLUE SHIELD | Admitting: Pain Medicine

## 2016-07-16 ENCOUNTER — Ambulatory Visit
Admission: RE | Admit: 2016-07-16 | Discharge: 2016-07-16 | Disposition: A | Discharge status: Home / Self Care | Payer: BLUE CROSS/BLUE SHIELD | Source: Ambulatory Visit | Attending: Sports Medicine | Admitting: Sports Medicine

## 2016-07-16 ENCOUNTER — Encounter: Payer: Self-pay | Admitting: Rehabilitative and Restorative Service Providers"

## 2016-07-16 ENCOUNTER — Encounter: Payer: Self-pay | Admitting: Pain Medicine

## 2016-07-16 DIAGNOSIS — S83241A Other tear of medial meniscus, current injury, right knee, initial encounter: Secondary | ICD-10-CM

## 2016-07-16 DIAGNOSIS — M23231 Derangement of other medial meniscus due to old tear or injury, right knee: Secondary | ICD-10-CM | POA: Insufficient documentation

## 2016-07-16 DIAGNOSIS — M94261 Chondromalacia, right knee: Secondary | ICD-10-CM | POA: Insufficient documentation

## 2016-07-16 DIAGNOSIS — Z7982 Long term (current) use of aspirin: Secondary | ICD-10-CM | POA: Insufficient documentation

## 2016-07-16 DIAGNOSIS — I10 Essential (primary) hypertension: Secondary | ICD-10-CM | POA: Insufficient documentation

## 2016-07-16 DIAGNOSIS — E119 Type 2 diabetes mellitus without complications: Secondary | ICD-10-CM | POA: Insufficient documentation

## 2016-07-16 HISTORY — PX: PR ARTHRS KNE SURG W/MENISCECTOMY MED/LAT W/SHVG: 29881

## 2016-07-16 SURGERY — ARTHROSCOPY, KNEE, WITH MENISCECTOMY
Anesthesia: General | Site: Knee | Laterality: Right | Wound class: Clean

## 2016-07-16 MED ORDER — LIDOCAINE HCL 1 % IJ SOLN *I*
0.1000 mL | Freq: Once | INTRAMUSCULAR | Status: DC | PRN
Start: 2016-07-16 — End: 2016-07-17
  Administered 2016-07-16: 0.1 mL via SUBCUTANEOUS

## 2016-07-16 MED ORDER — DEXAMETHASONE SODIUM PHOSPHATE 4 MG/ML INJ SOLN *WRAPPED*
INTRAMUSCULAR | Status: DC | PRN
Start: 2016-07-16 — End: 2016-07-16
  Administered 2016-07-16: 8 mg via INTRAVENOUS

## 2016-07-16 MED ORDER — CEFAZOLIN 1000 MG IN STERILE WATER 10ML SYRINGE *I*
PREFILLED_SYRINGE | INTRAVENOUS | Status: AC
Start: 2016-07-16 — End: 2016-07-16
  Filled 2016-07-16: qty 20

## 2016-07-16 MED ORDER — LIDOCAINE HCL 2 % IJ SOLN *I*
INTRAMUSCULAR | Status: DC | PRN
Start: 2016-07-16 — End: 2016-07-16
  Administered 2016-07-16: 100 mg via INTRAVENOUS

## 2016-07-16 MED ORDER — DEXAMETHASONE SODIUM PHOSPHATE 4 MG/ML INJ SOLN *WRAPPED*
INTRAMUSCULAR | Status: AC
Start: 2016-07-16 — End: 2016-07-16
  Filled 2016-07-16: qty 2

## 2016-07-16 MED ORDER — PROPOFOL 10 MG/ML IV EMUL (INTERMITTENT DOSING) WRAPPED *I*
INTRAVENOUS | Status: AC
Start: 2016-07-16 — End: 2016-07-16
  Filled 2016-07-16: qty 20

## 2016-07-16 MED ORDER — OXYCODONE HCL 5 MG/5ML PO SOLN *I*
ORAL | Status: AC
Start: 2016-07-16 — End: 2016-07-16
  Filled 2016-07-16: qty 10

## 2016-07-16 MED ORDER — ACETAMINOPHEN 325 MG PO TABS *I*
975.0000 mg | ORAL_TABLET | Freq: Once | ORAL | Status: AC | PRN
Start: 2016-07-16 — End: 2016-07-16

## 2016-07-16 MED ORDER — ONDANSETRON HCL 2 MG/ML IV SOLN *I*
INTRAMUSCULAR | Status: DC | PRN
Start: 2016-07-16 — End: 2016-07-16
  Administered 2016-07-16: 4 mg via INTRAVENOUS

## 2016-07-16 MED ORDER — ONDANSETRON HCL 2 MG/ML IV SOLN *I*
INTRAMUSCULAR | Status: AC
Start: 2016-07-16 — End: 2016-07-16
  Filled 2016-07-16: qty 2

## 2016-07-16 MED ORDER — FENTANYL CITRATE 50 MCG/ML IJ SOLN *WRAPPED*
INTRAMUSCULAR | Status: AC
Start: 2016-07-16 — End: 2016-07-16
  Filled 2016-07-16: qty 2

## 2016-07-16 MED ORDER — HALOPERIDOL LACTATE 5 MG/ML IJ SOLN *I*
1.0000 mg | Freq: Once | INTRAMUSCULAR | Status: AC | PRN
Start: 2016-07-16 — End: 2016-07-16

## 2016-07-16 MED ORDER — LIDOCAINE HCL 1 % IJ SOLN *I*
INTRAMUSCULAR | Status: AC
Start: 2016-07-16 — End: 2016-07-16
  Filled 2016-07-16: qty 2

## 2016-07-16 MED ORDER — KETOROLAC TROMETHAMINE 10 MG PO TABS *I*
10.0000 mg | ORAL_TABLET | Freq: Four times a day (QID) | ORAL | 0 refills | Status: DC | PRN
Start: 2016-07-16 — End: 2016-09-17

## 2016-07-16 MED ORDER — OXYCODONE HCL 5 MG/5ML PO SOLN *I*
5.0000 mg | Freq: Once | ORAL | Status: AC | PRN
Start: 2016-07-16 — End: 2016-07-16

## 2016-07-16 MED ORDER — KETOROLAC TROMETHAMINE 30 MG/ML IJ SOLN *I*
INTRAMUSCULAR | Status: AC
Start: 2016-07-16 — End: 2016-07-16
  Filled 2016-07-16: qty 1

## 2016-07-16 MED ORDER — KETOROLAC TROMETHAMINE 30 MG/ML IJ SOLN *I*
INTRAMUSCULAR | Status: DC | PRN
Start: 2016-07-16 — End: 2016-07-16
  Administered 2016-07-16: 30 mg via INTRAVENOUS

## 2016-07-16 MED ORDER — OXYCODONE-ACETAMINOPHEN 5-325 MG PO TABS *I*
1.0000 | ORAL_TABLET | ORAL | 0 refills | Status: DC | PRN
Start: 2016-07-16 — End: 2016-09-17

## 2016-07-16 MED ORDER — OXYCODONE HCL 5 MG/5ML PO SOLN *I*
10.0000 mg | Freq: Once | ORAL | Status: AC | PRN
Start: 2016-07-16 — End: 2016-07-16
  Administered 2016-07-16: 10 mg via ORAL

## 2016-07-16 MED ORDER — HYDROMORPHONE HCL PF 1 MG/ML IJ SOLN *WRAPPED*
0.5000 mg | INTRAMUSCULAR | Status: DC | PRN
Start: 2016-07-16 — End: 2016-07-17

## 2016-07-16 MED ORDER — LACTATED RINGERS IV SOLN *I*
20.0000 mL/h | INTRAVENOUS | Status: DC
Start: 2016-07-16 — End: 2016-07-17
  Administered 2016-07-16: 20 mL/h via INTRAVENOUS

## 2016-07-16 MED ORDER — CLINDAMYCIN PHOSPHATE IN D5W 900 MG/50ML IV SOLN *I*
900.0000 mg | INTRAVENOUS | Status: AC
Start: 2016-07-16 — End: 2016-07-16
  Administered 2016-07-16: 900 mg via INTRAVENOUS

## 2016-07-16 MED ORDER — LIDOCAINE HCL 2 % (PF) IJ SOLN *I*
INTRAMUSCULAR | Status: AC
Start: 2016-07-16 — End: 2016-07-16
  Filled 2016-07-16: qty 5

## 2016-07-16 MED ORDER — BUPIVACAINE-EPINEPHRINE 0.25 % IJ SOLUTION *WRAPPED*
INTRAMUSCULAR | Status: DC | PRN
Start: 2016-07-16 — End: 2016-07-16
  Administered 2016-07-16: 30 mL via SUBCUTANEOUS
  Administered 2016-07-16: 15 mL via SUBCUTANEOUS

## 2016-07-16 MED ORDER — FENTANYL CITRATE 50 MCG/ML IJ SOLN *WRAPPED*
INTRAMUSCULAR | Status: DC | PRN
Start: 2016-07-16 — End: 2016-07-16
  Administered 2016-07-16 (×2): 50 ug via INTRAVENOUS

## 2016-07-16 MED ORDER — MIDAZOLAM HCL 1 MG/ML IJ SOLN *I* WRAPPED
INTRAMUSCULAR | Status: DC | PRN
Start: 2016-07-16 — End: 2016-07-16
  Administered 2016-07-16: 2 mg via INTRAVENOUS

## 2016-07-16 MED ORDER — PROPOFOL 10 MG/ML IV EMUL (INTERMITTENT DOSING) WRAPPED *I*
INTRAVENOUS | Status: DC | PRN
Start: 2016-07-16 — End: 2016-07-16
  Administered 2016-07-16: 200 mg via INTRAVENOUS

## 2016-07-16 MED ORDER — MIDAZOLAM HCL 1 MG/ML IJ SOLN *I* WRAPPED
INTRAMUSCULAR | Status: AC
Start: 2016-07-16 — End: 2016-07-16
  Filled 2016-07-16: qty 2

## 2016-07-16 MED ORDER — PROMETHAZINE HCL 25 MG/ML IJ SOLN *I*
6.2500 mg | INTRAMUSCULAR | Status: DC | PRN
Start: 2016-07-16 — End: 2016-07-17

## 2016-07-16 SURGICAL SUPPLY — 42 items
APPLICATOR CHLORAPREP 26ML ORANGE LARGE (Solution) ×3 IMPLANT
ARTHOWAND BEVEL 3.0MM X 45 (Other) IMPLANT
BANDAGE ELAST SLF-CLSR 4 X5.5 LF NONSTER (Dressing) ×3 IMPLANT
BANDAGE ELAST SLF-CLSR 6X11 LF NONSTER (Dressing) ×3 IMPLANT
BANDAGE ESMARK 6IN LF STER USE 219461 (Dressing) IMPLANT
BLADE FULL RADIUS PRE BENT 15DEG 4.2MM (Supply) ×2 IMPLANT
BLADE FULL RADIUS RESECTOR 5.5MM (Supply) IMPLANT
BLADE SURG CARBON STEEL #10 STER (Supply) IMPLANT
BLADE ULTRATIGER 3.5MM (Supply) ×3 IMPLANT
BOOT KNEE HIGH NON SKID (Supply) ×6 IMPLANT
BUR SPHERICAL 3.5MM (Supply)
BUR SUR L13MM DIA3.5MM SPHR FOR BONY SITE PREP CART OSTEOCHNDRL DEB OSTEOPHYTE RESECT (Supply) IMPLANT
CAUTERY BOVIE SURGICAL PENCIL (Supply) IMPLANT
COVER CAMERA HANDLE ZOOM (Supply) IMPLANT
CUFF TOURNIQUET SPSB PLC 34IN STER DISP (Supply) ×3 IMPLANT
CUTTER SUTR KNOT PUSHER SLOTTED CAN SET (Other) IMPLANT
DRAPE SURG U PREP POLY 48 X 52IN (Drape) ×3 IMPLANT
DRAPE T-ARTHRSC W/POUCH 90X122X114 (Drape) ×3 IMPLANT
GLOVE SURG PROTEXIS PI CLASSIC 8.5 PF SYN (Glove) ×20 IMPLANT
GLOVE SURG PROTEXIS PI CLASSIC 9.0 PF SYN (Glove) ×3 IMPLANT
GOWN SIRIUS RAGLAN NONREINFORCED XL (Gown) ×5 IMPLANT
HANDLE LITE EZ RIGID NL (Supply) IMPLANT
KNEE IMMOBILIZER POST OP 24 IN LG (Supply) IMPLANT
MAT SUCT FLOOR PUDDLE GUPPY (Supply) ×3 IMPLANT
PACK CUSTOM ARTHROSCOPY KNEE CDS (Pack) ×3 IMPLANT
PAD GROUNDING ADULT SURE FIT (Supply) IMPLANT
PADDING WEBRIL 4IN LF NONSTER (Dressing) ×3 IMPLANT
SET ARTHRSC CASSETTE TUBE (Supply) ×3 IMPLANT
SLEEVE COMP KNEE HI MED (Supply) ×3 IMPLANT
SOL LACT RINGER IRRIG 3000ML BAG (Drug) ×6 IMPLANT
SPIKE MINI DISPENSING PIN (BBRAUN DP1000) (Supply) ×3 IMPLANT
SUTR ETHILON MONO 3-0 PS-2 BLACK (Suture) ×3 IMPLANT
SUTR MONOCRYL 3-0 PS-218IN UNDYED (Suture) IMPLANT
SUTR MONOCRYL PLUS 3-0PS2 27IN UNDY (Suture) IMPLANT
SUTR NON-ABSORBABLE 2-0 USE NO SUB (Suture) IMPLANT
SUTR VICRYL ANTIB 1 CT-1 POP 18 VIOLET (Suture) IMPLANT
SUTR VICRYL ANTIB BRD 2-0 CP-2 18 UNDYED (Suture) IMPLANT
SYRINGE LUERLOCK 30ML INDIVIDUAL WRAP (Supply) ×2 IMPLANT
SYRINGE ONLY 30ML LUER-LOK (Supply) ×1
TAPE AC ELAST ADH 3IN X 5YD LF (Supply) ×3 IMPLANT
TIP FLEXI-CLEAR SUCT HI CAPACITY (Supply) IMPLANT
TUBING MEDI-VAC NONCONDUC 12FT X 3/16IN (Tubing) ×3 IMPLANT

## 2016-07-16 NOTE — H&P (Signed)
UPDATES TO PATIENT'S CONDITION on the DAY OF SURGERY/PROCEDURE    I. Updates to Patient's Condition (to be completed by a provider privileged to complete a H&P, following reassessment of the patient by the provider):    Day of Surgery/Procedure Update:  History  History reviewed and no change    Physical  Physical exam updated and no change            II. Procedure Readiness   I have reviewed the patient's H&P and updated condition. By completing and signing this form, I attest that this patient is ready for surgery/procedure.    III. Attestation   I have reviewed the updated information regarding the patient's condition and it is appropriate to proceed with the planned surgery/procedure.    Jannifer Franklin, MD as of 10:58 AM 07/16/2016

## 2016-07-16 NOTE — Anesthesia Postprocedure Evaluation (Signed)
Anesthesia Post-Op Note    Patient: Lance Peters    Procedure(s) Performed:  Procedure Summary     Date Anesthesia Start Anesthesia Stop Room / Location    07/16/16 1218 1331 [id number] [facility]       Procedure Diagnosis Surgeon Attending Anesthesia    KNEE ARTHROSCOPY WITH PARTIAL MEDIAL MENISCECTOMY (Right Knee) Tear of medial meniscus of knee  (Tear of medial meniscus of knee [S83.249A]) Jannifer Franklin, MD Gwenith Spitz, MD        Recovery Vitals  BP: 117/75 (07/16/2016  1:30 PM)  Heart Rate: 55 (07/16/2016 10:48 AM)  Heart Rate (via Pulse Ox): 82 (07/16/2016  1:30 PM)  Resp: 16 (07/16/2016  1:30 PM)  Temp: 36 C (96.8 F) (07/16/2016  1:30 PM)  SpO2: 97 % (07/16/2016  1:30 PM)   0-10 Scale: 0 (07/16/2016 10:46 AM)  Anesthesia type:  General  Complications Noted During Procedure or in PACU:  None   Comment:    Patient Location:  PACU  Level of Consciousness:    Recovered to baseline  Patient Participation:     Able to participate  Temperature Status:    Normothermic  Oxygen Saturation:    Within patient's normal range  Cardiac Status:   Within patient's normal range  Fluid Status:    Stable  Airway Patency:     Yes  Pulmonary Status:    Baseline  Pain Management:    Adequate analgesia  Nausea and Vomiting:  None    Post Op Assessment:    Tolerated procedure well   Attending Attestation:  All indicated post anesthesia care provided     -

## 2016-07-16 NOTE — Progress Notes (Signed)
[place]  Peri-Operative Care Note      Patient is seen immediate post-op right knee surgery - Arthroscopy performed 07/16/2016.    Pain Assessment  Pain control measures reviewed, including appropriate use of ice and medications as prescribed. Patient is to call MD in the event of uncontrolled pain, nausea/vomiting or fever or with any additional concerns.    Joint Protection and Education  Proper don/doff of brace instructed pre-operatively, reviewed/confirmed post-operatively.   Joint protection concepts for ADLs, dressing, sleeping and self-care instructed pre-operatively; reviewed/confirmed post-operatively.     Immediate Post-Operative Exercises  Patient was instructed to perform exercises as follows per post-operative protocol:   Foot/ankle- Ankle pumps, begin today    Knee- Quad sets, begin today    Knee- PROM - flex, ext, begin tomorrow, as tolerated    Activity Counseling  Touch down Weight-Bearing - right lower extremity for 48 hours, then progress as tolerated.  Patient was educated on crutch ambulation including stairs.  Patient was advised to avoid stairs for the first 24-48 hours.  Patient was instructed not to drive.    Goals:  1. Pt independent with joint protection concepts for ADLs  2. Pt independent with joint ROM/muscular exercises as instructed and appropriate    Patient/family provided contact information for additional questions/concerns/follow-up care.  Patient was scheduled for therapy with [facility].  All questions were invited and answered to their satisfaction.    Thomasenia Sales, ATC

## 2016-07-16 NOTE — INTERIM OP NOTE (Signed)
Interim Op Note (Surgical Log ID: [id number])       Date of Surgery: 07/16/2016       Surgeons: Surgeon(s) and Role:     Jannifer Franklin, MD - Primary     * Pablo Ledger, MD - Resident - Assisting       Pre-op Diagnosis: Pre-Op Diagnosis Codes:     * Tear of medial meniscus of knee [S83.249A]       Post-op Diagnosis: Post-Op Diagnosis Codes:     * Tear of medial meniscus of knee [S83.249A]       Procedure(s) Performed: Procedure:    KNEE ARTHROSCOPY WITH PARTIAL MEDIAL MENISCECTOMY  CPT(R) Code:  83338 - PR ARTHRS KNE SURG W/MENISCECTOMY MED/LAT W/SHVG         Additional CPT Codes:        Anesthesia Type: General        Fluid Totals: I/O this shift:  04/11 0700 - 04/11 1459  In: 1000 (12.2 mL/kg) [I.V.:1000]  Out: 10 (0.1 mL/kg) [Blood:10]  Net: 990  Weight: 82.1 kg        Estimated Blood Loss: Blood Loss: 10 mL       Specimens to Pathology:  * No specimens in log *       Temporary Implants:        Packing:                 Patient Condition:          Findings (Including unexpected complications): See Op Note     Signed:  Pablo Ledger, MD  on 07/16/2016 at 1:31 PM

## 2016-07-16 NOTE — Op Note (Signed)
PATIENTANITA, MCADORY  MR #:  [id number]   CSN:  [id number] DOB:  [birth year]    AGE:  56     SURGEON:  Alwyn Pea, MD  CO-SURGEON:    ASSISTANT:    SURGERY DATE:  07/16/2016    PREOPERATIVE DIAGNOSIS:  Medial meniscus tear, right knee.    POSTOPERATIVE DIAGNOSIS:  Medial meniscus tear, right knee.    OPERATIVE PROCEDURE:  Surgical arthroscopy, partial medial meniscectomy (CPT [phone]).    ANESTHESIA:  General.    COMPLICATIONS:  None.    OPERATIVE INDICATIONS:  This is a 56 year old male who has had ongoing sharp mechanical medial knee pain that has not improved with nonoperative measures.  MRI confirmed an extensive tear of the posterior half of the medial meniscus with a deep radial tear going all the way out to the capsule communicating into an extensive horizontal tear with inferior extension resulting in gross instability of this posterior half of the meniscus.  Preoperatively, risks, benefits, treatment options, and limitations reviewed in detail.  Questions invited and answered.  He desires to proceed.    DESCRIPTION OF PROCEDURE:  After appropriate consent was obtained, the patient was taken to the operating room, where excellent general anesthesia was administered.  The patient remained stable throughout the case.  The patient's right lower extremity was then prepped and draped free in the usual sterile manner.  A superomedial outflow portal was created, then anterolateral and anteromedial portals were created.  Arthroscopic findings were as follows:  Suprapatellar pouch medial and lateral gutters were without abnormalities.  Patellofemoral joint showed good tracking, good preservation of articular surfaces.  The patella showed good preservation of articular surfaces.  The central proximal trochlea had grade 2 to mild grade 3 changes and underwent limited debridement.  The lateral compartment showed intact articular surfaces with intact lateral meniscus.  The intercondylar notch showed intact ACL and PCL.   The medial compartment showed overall good preservation of articular surfaces with some limited areas of grade 3 changes posteriorly directly over the area of the tear with some grade 3 changes of both tibial and femoral side, but again, fairly limited to the posterior central.  Limited debridement performed of that.     A portion of the medial meniscus had again a deep radial tear approximately 15 mm medial to the root extending all the way out through the capsule and then an extensive horizontal tear anterior and posterior from there resulting again in gross instability of the posterior third of the meniscus.  Partial medial meniscectomy was performed, resecting for a smooth, gradual taper from the anterior third to the anterior extent of the tear and from there to the far posterior horn attachment.  All loose meniscal fragments were removed.  Final irrigation was performed.  Sterile dressing applied.  The patient tolerated the procedure well and was taken to the recovery room in stable condition.             ______________________________  Alwyn Pea, MD    LMR/MODL  DD:  07/16/2016 15:12:49  DT:  07/16/2016 18:41:12  Job #:  [id number]    cc:

## 2016-07-16 NOTE — Anesthesia Procedure Notes (Addendum)
---------------------------------------------------------------------------------------------------------------------------------------    AIRWAY   GENERAL INFORMATION AND STAFF    Patient location during procedure: OR       Date of Procedure: 07/16/2016 12:26 PM  CONDITION PRIOR TO MANIPULATION     Current Airway/Neck Condition:  Normal        For more airway physical exam details, see Anesthesia PreOp Evaluation  AIRWAY METHOD     Patient Position:  Sniffing    Preoxygenated: yes      Induction: IV  Mask Difficulty Assessment:  0 - not attempted    Number of Attempts at Approach:  1    Number of Other Approaches Attempted:  0  FINAL AIRWAY DETAILS    Final Airway Type:  LMA    Final LMA: Unique    LMA Size: 5  ADDITIONAL COMMENTS   Atraumatic LMA insertion. + ETCO2 & BBS. Dentition and oral mucosa as prior.   ----------------------------------------------------------------------------------------------------------------------------------------

## 2016-07-16 NOTE — Discharge Instructions (Addendum)
Arthroscopic Knee Surgery  Dr. Jannifer Franklin  Phone - [phone].    1. You may begin to bear weight on the operated leg immediately after your anesthesia has worn off.  You should use crutches as necessary to prevent you from limping.  As your leg feels better, put more weight on  the leg and less on the crutches. When you can walk without a limp not using the crutches, you don't need to use them anymore. Don't get rid of them early--wait until you can walk comfortably without them.    2.        Besides walking, there are three specific exercises you will need to do:    [place]      a.  QUAD SETS - tightening the front of the thigh for the count of 5 and releasing.   b.  STRAIGHT-LEG LIFTS - lift your leg for the count of 5.   c.  Sit on the edge of your bed and gently bend and straighten your knee.    3. Your dressing can be removed in 2 days.  If the wounds are clean and dry, a shower with band-aids on (not a bath) may be taken, and the wounds cleaned with alcohol.  After cleaning the wound, reapply band-aids to each hole.    4. You will receive a prescription for pain medicine. Take as directed if needed. You may not need to have this prescription filled.    5. You may begin to drive:      - LEFT LEG OPERATED - 24 hours after your surgery if you have an automatic                 transmission.      - RIGHT LEG OPERATED - ask your surgeon.      - LEFT LEG OPERATED WITH STANDARD TRANSMISSION - ask your surgeon.    IF ANY OF THE FOLLOWING PERSIST, NOTIFY YOUR DOCTOR:    1. Pain that increases in intensity.  2. Increasing swelling of the knee.  3. Elevated temperature not associated with any other illness.      Due to the sedation medication and or general anesthesia you have been given today, please follow these discharge instructions:    [x]  Do not drive or operate any machinery for 24 hours or the specified time frame that was recommended by your doctor, please  refer to post op instructions.  [x]  Do not drink any alcoholic beverages for 24 hours after your procedure and/or if you are taking a narcotic pain reliever (e.g. Vicodin, Percocet or Tylenol #3).  [x]  Do not make any major decisions or sign contracts for 24 hours.  [x]  Prescription information provided from [facility].   []  Prescription information given to patient and/or patient representative, prescription not filled at [facility].    Diet: begin with liquids, advance as tolerated.    Your last pain medication was given to you at: Toradol at 1 pm, Oxycodone at 1:45 pm    Your next dose of pain medication is due after: Toradol at 7 pm, Percocet at 5:45 pm    If unable to reach your doctor at [phone] call 911 for a true emergency or go to your nearest emergency department.    It is our recommendation that you have someone stay with you for 24 hours following your procedure.

## 2016-07-16 NOTE — Anesthesia Case Conclusion (Signed)
CASE CONCLUSION  [facility]  Actions:  Suctioned and LMA removed  Criteria Used for Airway Removal:  Adequate Tv & RR, acceptable O2 saturation, following commands, strong hand grip and 5 sec head lift  Assessment:  Routine  Transport  Directly to: PACU  Airway:  Nasal cannula  Oxygen Delivery:  2 lpm  Position:  Recumbent  Patient Condition on Handoff  Level of Consciousness:  Mildly sedated  Patient Condition:  Stable  Handoff Report to:  RN  Comments: VSS.

## 2016-07-17 ENCOUNTER — Encounter: Payer: Self-pay | Admitting: Sports Medicine

## 2016-07-19 ENCOUNTER — Other Ambulatory Visit: Payer: Self-pay | Admitting: Primary Care

## 2016-07-21 ENCOUNTER — Ambulatory Visit: Payer: BLUE CROSS/BLUE SHIELD | Attending: Orthopedic Surgery | Admitting: Orthopedic Surgery

## 2016-07-21 ENCOUNTER — Encounter: Payer: Self-pay | Admitting: Sports Medicine

## 2016-07-21 ENCOUNTER — Ambulatory Visit: Payer: BLUE CROSS/BLUE SHIELD | Attending: Sports Medicine

## 2016-07-21 VITALS — BP 128/72 | HR 77 | Ht 70.0 in | Wt 180.0 lb

## 2016-07-21 DIAGNOSIS — Z9889 Other specified postprocedural states: Secondary | ICD-10-CM

## 2016-07-21 DIAGNOSIS — S83206A Unspecified tear of unspecified meniscus, current injury, right knee, initial encounter: Secondary | ICD-10-CM | POA: Insufficient documentation

## 2016-07-21 NOTE — Progress Notes (Signed)
Date: 07/21/2016  Reason for visit: Lance Peters is a 56 y.o. male who presents today for suture removal    Patient tolerated suture removal: yes    Drainage: none    Sutures removed:yes    Incision(s) site: Is clean, dry and intact.    Benzoin applied: yes  Steri strips applied: yes  Dressing applied: ace aapplied    Dyanne Carrel, RN

## 2016-07-21 NOTE — Progress Notes (Signed)
[facility]   POST-OPERATIVE KNEE EVALUATION    Diagnosis:  Menisectomy,medial     Date of Surgery: 07/16/16    History  Lance Peters is a 56 y.o. male who is present today for right knee care s/p Menisectomy,medial   Pt reports pain is mild.  Symptom location: Anterior, right  Relevant symptoms:  Aching, Throbbing, Pain   Symptom frequency: Intermittent  Symptom intensity:  (0 - 10 scale): Now 3 Best 3 Worst 6 in past few days, moving it sideways   Night Pain: Yes   Restful sleep:   No  Morning Pain/Stiffness: Increased   Symptoms worsen with: movements laterally  Symptoms improve with: Rest, Ice, Medication  Assistive device:  crutches  Patients goals for therapy: Return to work, Return to sport, Reduce pain, Increase ROM, Increase strength, Independent with home program  Post-op note reviewed: Yes    Occupation and Activities  Work status: Off work  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 [employer]  Stresses/physical demands of job: Optometrist, Land  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, Gardening/Yard Work, Office manager and Sports  Sport(s): Best boy       Objective    Observation: WNL  Gait:  PWB, Axillary Crutches    Lumbar Screen:  NA  Neurologic:  patient denises numbness and tingling in to leg    Palpation: swelling, tenderness and warmth  Incision:  Benign and No evidence of infection      ROM/Strength  Full strength/ROM assessment deferred secondary to acute post-op status    KNEE LEFT RIGHT STRENGTH    PROM AROM PROM AROM Left Right   Flexion  127  118 NA NA   Extension Hyper 1  2 lacking  NA NA            Quad Isometric (Quad set)     Good Good   SLR     Good Lag with SLR   minimal         Special Tests:   Homans: negative  Remainder of LE special tests deferred secondary to acute post-op status        Functional:  Perform self-care activities/basic ADLs - able to perform.  Ascend stairs with reciprocal gait - able to perform.  Descend  stairs with reciprocal gait - able to perform.  Return to work full duty - unable to perform.  Return to sport/activities - unable to perform.    Assessment:    Findings consistent with 56 y.o. male with Menisectomy,medial  with pain, ROM limitations, strength limitations, functional limitations    Prognosis:  Good   Contraindications/Precautions/Limitation:  Per protocol and Per diagnosis  Short Term Goals: (6 week(s)): Increase ROM to full and Minimal assistance with HEP/ education concepts  Long Term Goals: (3 month(s)): Pain/Sx 0 - minimal, ROM/ flexibility WNL , Restoration of functional strength, Independent with HEP/education , Functional return to ADLs / activities without limitations     Treatment Plan:   Options / plan reviewed with patient:  Yes  Freq 1 times per week for 3 month(s)    Treatment plan inclusive of:   Exercise: AAROM, PROM, Stretching, Progressive Resistive   Manual Techniques:  N/A   Modalities:  Cryotherapy, Ther Exercise per flowsheet   Functional: Proprioception/Dynamic stability, Sports specific, Functional rehab    Thank you for referring this patient to [facility].    Rennie Plowman, ATC    DOS: 07/16/16   WEIGHTBEARING  STATUS as tolerated       Prop' 5'   QS hep   SLR 2x10    Heel dig 30x 5 sec    Calf raise 30x   Mini squat 2x10   Prone FLX 5'

## 2016-07-21 NOTE — Progress Notes (Signed)
CHIEF COMPLAINT: Postop check    INTERVAL HISTORY: Patient returns today for his first postoperative visit following his right knee arthroscopy with partial medial meniscectomy.  Areas of grade 2 chondromalacia.  He indicates definite symptomatic improvement with respect to his mechanical symptoms.  He is set to start physical therapy later today.  He does take Feldene at baseline.    PFSH:  Since last visit no change.    ROS:  Since last visit no change.    MEDICATION:  Since last visit no new meds.    ALLERGIES:  As per initial evaluation.    PHYSICAL EXAM: His right knee today shows well-healed incisions.  No surrounding erythema.  He can extend his knee actively to 0.  Flexion to 115.  No instability or pain with varus or valgus stress.  His calf is soft and nontender.  CMS checks are grossly intact distally.    IMAGING:    ASSESSMENT: Excellent initial postoperative course    PLAN: Sutures are removed and replaced Steri-Strips.  He will start therapy today.  Follow-up in 3 weeks.  He will call prior to that appointment with any problems or questions.  May consider a left knee cortisone injection at his next office visit.    ORDERS TODAY:    ORDERS NEXT VISIT:    PERCENT OF TEMPORARY IMPAIRMENT:

## 2016-07-23 ENCOUNTER — Other Ambulatory Visit
Admission: RE | Admit: 2016-07-23 | Discharge: 2016-07-23 | Disposition: A | Discharge status: Home / Self Care | Payer: BLUE CROSS/BLUE SHIELD | Source: Ambulatory Visit | Attending: Psychiatry | Admitting: Psychiatry

## 2016-07-23 DIAGNOSIS — F1010 Alcohol abuse, uncomplicated: Secondary | ICD-10-CM | POA: Insufficient documentation

## 2016-07-28 ENCOUNTER — Ambulatory Visit: Payer: BLUE CROSS/BLUE SHIELD

## 2016-07-28 DIAGNOSIS — S83206A Unspecified tear of unspecified meniscus, current injury, right knee, initial encounter: Secondary | ICD-10-CM

## 2016-07-28 NOTE — Progress Notes (Signed)
Orthopedic Sports/Spine ATC Note    Garin Mata   [id number]     Diagnosis: Right knee menisectomy   DOS: 07/16/16    Subjective:  Pain Score: 3  Pain: Improved, Patient states he is feeling better. Patient reports he has weaned off the crutches completely. Patient reports he has been doing his exs at home, the squats make his soreness increase.    Objective:  ROM - Improved, Right, Knee, EXT hyper 1, FLEX 122  Strength - Ther Ex per flowsheet  Function: - Patient is ambulating without crutches  Education:  Updated HEP, Verbal cues for ther ex, Manual cues for ther ex    Objective         DOS: 07/16/16   WEIGHTBEARING STATUS as tolerated       Prop' 5'   QS hep   SLR 2x10    Heel dig 30x 5 sec    Calf raise 30x   Mini squat 2x10   Prone FLX 5'   Hamstring curls** 30x   Bridge** With ball 10x10 sec   Clock steps** 10x   Step up--->        Bike** 6'   ** indicates new or changed exs      Treatment:  Ther Exercise per flowsheet    Assessment:  Patient demonstrated squats he has been doing at home. Patients knees go far over toes and he has knee varus. We were able to correct with verbal and manual cues.        Plan of Care:  Continue per Plan of care -  As written consider adding step ups next visit.     Thank you for referring this patient to [facility].    Rennie Plowman, ATC

## 2016-07-31 ENCOUNTER — Other Ambulatory Visit
Admission: RE | Admit: 2016-07-31 | Discharge: 2016-07-31 | Disposition: A | Discharge status: Home / Self Care | Payer: BLUE CROSS/BLUE SHIELD | Source: Ambulatory Visit | Attending: Psychiatry | Admitting: Psychiatry

## 2016-07-31 DIAGNOSIS — F1020 Alcohol dependence, uncomplicated: Secondary | ICD-10-CM | POA: Insufficient documentation

## 2016-08-04 ENCOUNTER — Ambulatory Visit: Payer: BLUE CROSS/BLUE SHIELD

## 2016-08-04 DIAGNOSIS — S83206A Unspecified tear of unspecified meniscus, current injury, right knee, initial encounter: Secondary | ICD-10-CM

## 2016-08-04 NOTE — Progress Notes (Signed)
Orthopedic Sports/Spine ATC Note    Lance Peters   [id number]     Diagnosis: Right knee menisectomy   DOS: 07/16/16    Subjective:  Pain Score: 2  Pain: Improved, patient states he feels better. Patient states he still has soreness at night. Patient reports he has been able to go for walks without and problems. Patient reports he is also able to ambulate stairs with no problems.     Objective:  ROM - Improved, Right, Knee, EXT hyper 1, FLEX 125  Strength - Ther Ex per flowsheet  Function: - Patient is ambulating without crutches  Education:  Updated HEP, Verbal cues for ther ex, Manual cues for ther ex    Objective         DOS: 07/16/16   WEIGHTBEARING STATUS as tolerated       Prop' 5'   QS hep   SLR 2x10    Heel dig 30x 5 sec    Calf raise 30x   Bendover** 3x10   Mini squat 2x10   Prone FLX 5'       Bridge With ball 10x10 sec   Clock steps 10x   Step up** 2" 3X10   Bandwalks** 3X10 BLUE   Bike 6'   ** indicates new or changed exs      Treatment:  Ther Exercise per flowsheet    Assessment:  Patient tolerated exercises well. Patient needed minmal cueing for former exs. Patient needed mod to max cueing with step ups. Patient is progressing well, he currently has full ROM. Patient would benefit from continued formal therapy to work on strength and function.        Plan of Care:  Continue per Plan of care -  As written  Step down, SLB    Thank you for referring this patient to [facility].    Rennie Plowman, ATC

## 2016-08-05 ENCOUNTER — Other Ambulatory Visit
Admission: RE | Admit: 2016-08-05 | Discharge: 2016-08-05 | Disposition: A | Discharge status: Home / Self Care | Payer: BLUE CROSS/BLUE SHIELD | Source: Ambulatory Visit | Attending: Psychiatry | Admitting: Psychiatry

## 2016-08-05 DIAGNOSIS — F1020 Alcohol dependence, uncomplicated: Secondary | ICD-10-CM | POA: Insufficient documentation

## 2016-08-11 ENCOUNTER — Ambulatory Visit: Payer: BLUE CROSS/BLUE SHIELD | Attending: Sports Medicine

## 2016-08-11 DIAGNOSIS — S83241D Other tear of medial meniscus, current injury, right knee, subsequent encounter: Secondary | ICD-10-CM | POA: Insufficient documentation

## 2016-08-11 DIAGNOSIS — S83206A Unspecified tear of unspecified meniscus, current injury, right knee, initial encounter: Secondary | ICD-10-CM | POA: Insufficient documentation

## 2016-08-11 NOTE — Progress Notes (Signed)
Orthopedic Sports/Spine ATC Note    Lance Peters   [id number]     Diagnosis: Right knee menisectomy   DOS: 07/16/16    Subjective:  Pain Score: 2  Pain: Improved, patient states he feels better. Patient states he still has soreness at night. Patient reports he has been able to go for walks without and problems. Patient reports he is also able to ambulate stairs with no problems.     Objective:  ROM - Improved, Right, Knee, EXT hyper 1, FLEX 130  Strength - Ther Ex per flowsheet  Function: - Patient able to ambulate up and down the stairs with no difficulty  Education:  Updated HEP, Verbal cues for ther ex, Manual cues for ther ex    Objective         DOS: 07/16/16   WEIGHTBEARING STATUS as tolerated       Prop' 5'   QS hep   SLR 2x10    Heel dig 30x 5 sec    Calf raise 30x   Bendover 3x10   Mini squat 30x   Prone FLX 5'       Bridge With ball 10x10 sec   Clock steps 10x   Step up 2" 3X10   Step down** 2" 3x10   SLB** 3x30sec   Bandwalks 3X10 BLUE   Bike 6'   ** indicates new or changed exs      Treatment:  Ther Exercise per flowsheet    Assessment:  Patient tolerated exs well. Patient able to do step downs with verbal and manual cues. Patient was lacking a bit of extension this week so I had him prop at the end of the session. Other wise patient progressing well.        Plan of Care:  Continue per Plan of care -  As written consider adding bridges and 3 cone touches, add weights to exs.     Thank you for referring this patient to [facility].    Rennie Plowman, ATC

## 2016-08-13 ENCOUNTER — Ambulatory Visit: Payer: BLUE CROSS/BLUE SHIELD | Attending: Orthopedic Surgery | Admitting: Orthopedic Surgery

## 2016-08-13 ENCOUNTER — Encounter: Payer: Self-pay | Admitting: Orthopedic Surgery

## 2016-08-13 VITALS — BP 118/76 | HR 66 | Ht 70.0 in | Wt 175.0 lb

## 2016-08-13 DIAGNOSIS — Z9889 Other specified postprocedural states: Secondary | ICD-10-CM

## 2016-08-13 DIAGNOSIS — M1712 Unilateral primary osteoarthritis, left knee: Secondary | ICD-10-CM

## 2016-08-13 MED ORDER — LIDOCAINE HCL 1 % IJ SOLN *I*
5.0000 mL | Freq: Once | INTRAMUSCULAR | Status: AC | PRN
Start: 2016-08-13 — End: 2016-08-13
  Administered 2016-08-13: 5 mL via INTRA_ARTICULAR

## 2016-08-13 MED ORDER — BETAMETHASONE ACET & SOD PHOS 6 (3-3) MG/ML IJ SUSP *I*
12.0000 mg | Freq: Once | INTRAMUSCULAR | Status: AC | PRN
Start: 2016-08-13 — End: 2016-08-13
  Administered 2016-08-13: 13:00:00 12 mg via INTRA_ARTICULAR

## 2016-08-13 NOTE — Procedures (Signed)
Large Joint Aspiration/Injection Procedure    Date/Time: 08/13/2016 1:15 PM  Consent given by: patient  Site marked: site marked  Timeout: Immediately prior to procedure a time out was called to verify the correct patient, procedure, equipment, support staff and site/side marked as required     Procedure Details    Location: knee - L knee intra - articular  Preparation: The site was prepped using the usual aseptic technique.  Anesthetics administered: 5 mL lidocaine 1 %  Intra-Articular Steroids administered: 12 mg betamethasone acetate & sodium phosphate 6 (3-3) MG/ML  Dressing:  A dry, sterile dressing was applied.  Patient tolerance: patient tolerated the procedure well with no immediate complications

## 2016-08-13 NOTE — Progress Notes (Signed)
CHIEF COMPLAINT: Follow-up left knee    INTERVAL HISTORY: Patient returns now roughly a month following his partial medial meniscectomy on his right knee.  He continues to progress fairly well with this.  He is here today primarily for increase in his left knee arthritis pain.  He does also have a small meniscus tear there as well.    PFSH:  Since last visit no change.    ROS:  Since last visit no change.    MEDICATION:  Since last visit no new meds.    ALLERGIES:  As per initial evaluation.    PHYSICAL EXAM: His left knee today shows no increased warmth or erythema.  No effusion today.  He can extend actively to 0.  Flexion is to 120.  There is no instability or pain with varus or valgus stress.  There is some mild discomfort medially which does worsen slightly with circumduction maneuvers.  Good range of motion of the left hip without any reproduction of his usual discomfort.    IMAGING:    ASSESSMENT: Increased symptoms of left knee arthritis    PLAN: Left knee cortisone injection was offered and performed.  Please refer to procedure note for details.  Follow-up in 1 month.  He will call prior to that appointment with any problems or questions.    ORDERS TODAY:    ORDERS NEXT VISIT:    PERCENT OF TEMPORARY IMPAIRMENT:

## 2016-08-14 ENCOUNTER — Other Ambulatory Visit
Admission: RE | Admit: 2016-08-14 | Discharge: 2016-08-14 | Disposition: A | Discharge status: Home / Self Care | Payer: BLUE CROSS/BLUE SHIELD | Source: Ambulatory Visit | Attending: Psychiatry | Admitting: Psychiatry

## 2016-08-14 DIAGNOSIS — F1020 Alcohol dependence, uncomplicated: Secondary | ICD-10-CM | POA: Insufficient documentation

## 2016-08-16 ENCOUNTER — Other Ambulatory Visit: Payer: Self-pay | Admitting: Primary Care

## 2016-08-18 ENCOUNTER — Ambulatory Visit: Payer: BLUE CROSS/BLUE SHIELD

## 2016-08-18 DIAGNOSIS — S83241D Other tear of medial meniscus, current injury, right knee, subsequent encounter: Secondary | ICD-10-CM

## 2016-08-18 NOTE — Progress Notes (Signed)
Orthopedic Sports/Spine ATC Note    Lance Peters   [id number]     Diagnosis: Right knee menisectomy   DOS: 07/16/16    Subjective:  Pain Score: 2  Pain: Improved, patient states he feels better. Pt notes that he was sore after performing yard work over the weekend. Pt has been cleared to go back to work full duty on 5/27.    Objective:  ROM - Improved, Right, Knee, EXT hyper 1, FLEX 130  Strength - Ther Ex per flowsheet  Function: - Patient able to ambulate up and down the stairs with no difficulty  Education:  Updated HEP, Verbal cues for ther ex, Manual cues for ther ex    Objective         DOS: 07/16/16   WEIGHTBEARING STATUS as tolerated       Prop' HEP   QS HEP   SLR 2x10    Heel dig HEP   Calf raise 3# DB Each Hand 30x   Bendover 3# DB Each Hand  3x10   Mini squat 3# DB Each Hand  30x   Prone FLX --   SL Hip Abduction 30x        Bridge --   Clock steps 10x   Step up 2" 3X10   Step down 2" 3x10   SLB 3x30sec   Bandwalks 3X10 BLUE   Leg Press 70# 20x   Quad Extension 10# 2x10   3 Cone Touch -->        Ellptical 8'         Treatment:  Ther Exercise per flowsheet    Assessment:  Patient tolerated exs well. Pt able to add weighted exercises without complaints. Pt would benefit from continued strengthening and stability at this time.        Plan of Care:  Continue per Plan of care -  As written     Thank you for referring this patient to [place].    Earl Gala, ATC,PTA

## 2016-08-23 ENCOUNTER — Encounter: Payer: Self-pay | Admitting: Primary Care

## 2016-08-24 MED ORDER — SERTRALINE HCL 50 MG PO TABS *I*
50.0000 mg | ORAL_TABLET | Freq: Every day | ORAL | 5 refills | Status: DC
Start: 2016-08-24 — End: 2017-02-18

## 2016-09-02 ENCOUNTER — Ambulatory Visit: Payer: BLUE CROSS/BLUE SHIELD

## 2016-09-02 ENCOUNTER — Other Ambulatory Visit
Admission: RE | Admit: 2016-09-02 | Discharge: 2016-09-02 | Disposition: A | Discharge status: Home / Self Care | Payer: BLUE CROSS/BLUE SHIELD | Source: Ambulatory Visit | Attending: Psychiatry | Admitting: Psychiatry

## 2016-09-02 DIAGNOSIS — F1020 Alcohol dependence, uncomplicated: Secondary | ICD-10-CM | POA: Insufficient documentation

## 2016-09-03 ENCOUNTER — Ambulatory Visit: Payer: BLUE CROSS/BLUE SHIELD

## 2016-09-03 DIAGNOSIS — S83241D Other tear of medial meniscus, current injury, right knee, subsequent encounter: Secondary | ICD-10-CM

## 2016-09-03 NOTE — Progress Notes (Signed)
Orthopedic Sports/Spine ATC Note    Lance Peters   [id number]     Diagnosis: Right knee menisectomy   DOS: 07/16/16    Subjective:  Pain Score: 2  Pain: Improved, patient states he feels better. Pt notes that he has returned to work without complaints. He has only noted pain while stepping laterally or ascending stairs to quickly.     Objective:  ROM - Improved, Right, Knee, EXT hyper 1, FLEX 130  Strength - Ther Ex per flowsheet  Function: - Patient able to ambulate up and down the stairs with no difficulty  Education:  Updated HEP, Verbal cues for ther ex, Manual cues for ther ex    Objective         DOS: 07/16/16   WEIGHTBEARING STATUS as tolerated       Calf raise 3# DB Each Hand --   Bendover 3# DB Each Hand  --   Mini squat 3# DB Each Hand  --   Prone FLX --   SL Hip Abduction --        Bridge --   Clock steps 10x   Step up 2" 3X10   Step down 2" 3x10   Lateral Step Down 2" 2x10   SLB (Airex Pad) 3x30sec B    Bandwalks 3X10 BLUE   Leg Press 75# 20x   Quad Extension 10# 2x10   3 Cone Touch 10x        Elliptical 8'         Treatment:  Ther Exercise per flowsheet    Assessment:  Patient tolerated exs well. Pt able to add lateral movement exercises focusing on stability. Pt would benefit from continued hip and stability strengthening. Pt will attempt a walk/run progression on treadmill at home prior to next visit.        Plan of Care:  Continue per Plan of care -  As written     Thank you for referring this patient to [facility].    Earl Gala, ATC,PTA

## 2016-09-08 ENCOUNTER — Ambulatory Visit: Payer: BLUE CROSS/BLUE SHIELD

## 2016-09-11 ENCOUNTER — Other Ambulatory Visit
Admission: RE | Admit: 2016-09-11 | Discharge: 2016-09-11 | Disposition: A | Discharge status: Home / Self Care | Payer: BLUE CROSS/BLUE SHIELD | Source: Ambulatory Visit | Attending: Primary Care | Admitting: Primary Care

## 2016-09-11 ENCOUNTER — Other Ambulatory Visit
Admission: RE | Admit: 2016-09-11 | Discharge: 2016-09-11 | Disposition: A | Discharge status: Home / Self Care | Payer: BLUE CROSS/BLUE SHIELD | Source: Ambulatory Visit | Attending: Psychiatry | Admitting: Psychiatry

## 2016-09-11 DIAGNOSIS — F1020 Alcohol dependence, uncomplicated: Secondary | ICD-10-CM | POA: Insufficient documentation

## 2016-09-11 DIAGNOSIS — I10 Essential (primary) hypertension: Secondary | ICD-10-CM | POA: Insufficient documentation

## 2016-09-11 DIAGNOSIS — R7301 Impaired fasting glucose: Secondary | ICD-10-CM

## 2016-09-11 LAB — BASIC METABOLIC PANEL
Anion Gap: 11 (ref 7–16)
CO2: 27 mmol/L (ref 20–28)
Calcium: 10 mg/dL (ref 8.6–10.2)
Chloride: 102 mmol/L (ref 96–108)
Creatinine: 1.01 mg/dL (ref 0.67–1.17)
GFR,Black: 95 *
GFR,Caucasian: 82 *
Glucose: 116 mg/dL — ABNORMAL HIGH (ref 60–99)
Lab: 18 mg/dL (ref 6–20)
Potassium: 4.6 mmol/L (ref 3.3–5.1)
Sodium: 140 mmol/L (ref 133–145)

## 2016-09-11 LAB — HEMOGLOBIN A1C: Hemoglobin A1C: 5.9 % (ref 4.0–6.0)

## 2016-09-17 ENCOUNTER — Encounter: Payer: Self-pay | Admitting: Primary Care

## 2016-09-17 ENCOUNTER — Ambulatory Visit: Payer: BLUE CROSS/BLUE SHIELD | Attending: Primary Care | Admitting: Primary Care

## 2016-09-17 VITALS — BP 130/80 | HR 56 | Ht 70.0 in | Wt 175.6 lb

## 2016-09-17 DIAGNOSIS — E782 Mixed hyperlipidemia: Secondary | ICD-10-CM

## 2016-09-17 DIAGNOSIS — I10 Essential (primary) hypertension: Secondary | ICD-10-CM

## 2016-09-17 DIAGNOSIS — R7301 Impaired fasting glucose: Secondary | ICD-10-CM

## 2016-09-17 MED ORDER — AMLODIPINE BESYLATE 10 MG PO TABS *I*
5.0000 mg | ORAL_TABLET | Freq: Every day | ORAL | 11 refills | Status: DC
Start: 2016-09-17 — End: 2017-01-15

## 2016-09-17 NOTE — Progress Notes (Signed)
Lance Peters is a 56 y.o. male   09/17/2016    No chief complaint on file.      HPI:  Lance Peters arrived for a scheduled follow up appointment.    Following up on chronic medical problems which include impaired fasting glucose, hypertension, hyperlipidemia.    Over the past several months he's lost around 40 pounds.  He's done this with a combination of fairly intensive dietary intervention, and is also given up alcohol.  He saw dietitian, who had him on an 800-calorie a day diet for several weeks, slowly increased, now on a 2000-calorie a day diet, plus or minus.  He is feeling a lot better.  A little disappointed that his hemoglobin A1c is higher than it was in February, but when he had the blood work done in February he was on the very low calorie diet.    We discussed his recent labs.  Hemoglobin A1c is 5.9, which is normal.    He is still taking all of his medicines as prescribed, missing any doses.  He says that he frequently feels lightheaded when he gets up quickly.    He is on naltrexone to help with abstinence from alcohol use.  He has been off alcohol for about 2 months now.    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    Bilateral carpal tunnel syndrome G56.03    Ulnar neuropathy of right upper extremity G56.21       Allergies: Penicillins    Medication list reviewed, changes made  Current Outpatient Prescriptions   Medication Sig Note    naltrexone (DEPADE) 50 MG tablet Take 50 mg by mouth daily 09/17/2016: Received from: External Pharmacy Received Sig: TAKE 1 TABLET BY MOUTH EVERY DAY    sertraline (ZOLOFT) 50 MG tablet Take 1 tablet (50 mg total) by mouth daily     atorvastatin (LIPITOR) 20 MG tablet TAKE 1 TABLET BY MOUTH EVERY DAY  WITH DINNER     acetaminophen (TYLENOL) 325 MG tablet Take by mouth every 6 hours as needed for Pain     benazepril (LOTENSIN) 10 MG tablet TAKE 1 TABLET BY MOUTH EVERY DAY     amLODIPine (NORVASC) 10 MG tablet TAKE 1 TABLET BY MOUTH EVERY DAY     piroxicam (FELDENE) 20 MG capsule Take 1 capsule (20 mg total) by mouth daily     aspirin 81 MG tablet Take 81 mg by mouth daily      No current facility-administered medications for this visit.         Allergy list reviewed  Problem list reviewed    OBJECTIVE:  Vitals:    09/17/16 1511   BP: 130/80   Pulse: 56   Weight: 79.7 kg (175 lb 9.6 oz)   Height: 1.778 m (5\' 10" )     Alert, oriented, appears well    Recent Results (from the past 336 hour(s))   Basic metabolic panel    Collection Time: 09/11/16  8:01 AM   Result Value Ref Range    Glucose 116 (H) 60 - 99 mg/dL    Sodium 140 133 - 145 mmol/L    Potassium 4.6  3.3 - 5.1 mmol/L    Chloride 102 96 - 108 mmol/L    CO2 27 20 - 28 mmol/L    Anion Gap 11 7 - 16    UN 18 6 - 20 mg/dL    Creatinine 1.01 0.67 - 1.17 mg/dL    GFR,Caucasian 82 *    GFR,Black 95 *    Calcium 10.0 8.6 - 10.2 mg/dL   Hemoglobin A1c    Collection Time: 09/11/16  8:01 AM   Result Value Ref Range    Hemoglobin A1C 5.9 4.0 - 6.0 %          ASSESSMENT/PLAN:  Impaired fasting glucose-hemoglobin A1c is normal.  Congratulated him on his dietary changes, weight loss, abstinence from alcohol.  Encouraged him to continue.  Will recheck labs in 3 months with my chart follow-up, again in 6 months with office appointment.    Hypertension-blood pressure looks pretty good here, he reports some orthostatic symptoms.  We will decrease his amlodipine to 5 mg daily, return for blood pressure check in a month.  Continue ACE inhibitor unchanged.    Dyslipidemia-appropriate statin dose, continue.  Check cholesterol panel with next blood work.    Alcohol abuse disorder-now abstinent for 2 months, doing well.  Offered congratulations encouragement to  continue.    Medication instructions were discussed with patient (and advocate). Advised that written instructions will also be provided by the pharmacy. Anticipated side effects discussed. Encouraged patient to call with any questions or concerns.

## 2016-09-18 ENCOUNTER — Other Ambulatory Visit
Admission: RE | Admit: 2016-09-18 | Discharge: 2016-09-18 | Disposition: A | Discharge status: Home / Self Care | Payer: BLUE CROSS/BLUE SHIELD | Source: Ambulatory Visit | Attending: Psychiatry | Admitting: Psychiatry

## 2016-09-18 DIAGNOSIS — F1020 Alcohol dependence, uncomplicated: Secondary | ICD-10-CM | POA: Insufficient documentation

## 2016-09-22 ENCOUNTER — Ambulatory Visit: Payer: BLUE CROSS/BLUE SHIELD | Attending: Sports Medicine

## 2016-09-22 DIAGNOSIS — S83206A Unspecified tear of unspecified meniscus, current injury, right knee, initial encounter: Secondary | ICD-10-CM | POA: Insufficient documentation

## 2016-09-22 DIAGNOSIS — S83241D Other tear of medial meniscus, current injury, right knee, subsequent encounter: Secondary | ICD-10-CM

## 2016-09-22 NOTE — Progress Notes (Signed)
Orthopedic Sports/Spine ATC Note    Lance Peters   [id number]     Diagnosis: Right knee menisectomy   DOS: 07/16/16    Subjective:  Pain Score: 2  Pain: Improved, patient states he is feeling great. Pt notes that he only has occasional episodes of pain if he has done to much on the knee. Pt would like to D/C to HEP at this time.  Pt feels he has met all functional goals of therapy and will be returning to the gym. Pt has began running again with his wife with only general soreness complaints.     Objective:  ROM - Improved, Right, Knee, EXT hyper 1, FLEX 130  Strength - Ther Ex per flowsheet  Function: - Patient able to ambulate up and down the stairs with no difficulty  Education:  Updated HEP, Verbal cues for ther ex, Manual cues for ther ex    Objective         DOS: 07/16/16   WEIGHTBEARING STATUS as tolerated       Calf raise 3# DB Each Hand --   Bendover 3# DB Each Hand  --   Mini squat 3# DB Each Hand  --   Prone FLX --   SL Hip Abduction --        Bridge --   Clock steps 10x   Step up 2" 3X10   Step down 2" 3x10   Lateral Step Down 2" 2x10   SLB (Airex Pad) 3x30sec B    Bandwalks 3X10 BLUE   Leg Press 105# 30x   Quad Extension 30# 2x10   3 Cone Touch 10x                Elliptical 10'         Treatment:  Ther Exercise per flowsheet    Assessment:  Patient tolerated exs well. Pt able to review HEP without complaints. Pt was provided with RTR progression per patient request. Pt would like to attempt HEP at this time and F/U if needed.         Plan of Care:  Continue per Plan of care -  Discharge     Thank you for referring this patient to [facility].    Earl Gala, ATC,PTA

## 2016-09-23 ENCOUNTER — Encounter: Payer: Self-pay | Admitting: Sports Medicine

## 2016-09-23 ENCOUNTER — Ambulatory Visit: Payer: BLUE CROSS/BLUE SHIELD | Attending: Orthopedic Surgery | Admitting: Orthopedic Surgery

## 2016-09-23 VITALS — BP 129/58 | HR 53 | Ht 70.0 in | Wt 173.0 lb

## 2016-09-23 DIAGNOSIS — Z9889 Other specified postprocedural states: Secondary | ICD-10-CM

## 2016-09-23 DIAGNOSIS — M1711 Unilateral primary osteoarthritis, right knee: Secondary | ICD-10-CM

## 2016-09-23 NOTE — Progress Notes (Signed)
CHIEF COMPLAINT: Follow-up right knee    INTERVAL HISTORY: Patient returns for follow-up of his right knee.  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  We did provide a cortisone injection roughly 6 weeks ago.  He presents today noting definite symptomatic improvement.  Overall he feels he is doing much better.  He does still have some mild difficulty with kneeling, however weightbearing activities are much improved.  He is taking Tylenol.  He also takes peroxicam.    PFSH:  Since last visit no change.    ROS:  Since last visit no change.    MEDICATION:  Since last visit no new meds.    ALLERGIES:  As per initial evaluation.    PHYSICAL EXAM: His right knee today shows no effusion.  No erythema.  He can extend his knee actively to 0.  Flexion is to 120.  No instability or pain with varus or valgus stress.  No joint line tenderness today.  Good motion about the right hip without reproduction of pain.    IMAGING:    ASSESSMENT: Much improved now 2.5 months following right knee arthroscopy with partial medial meniscectomy.  6 weeks following cortisone injection    PLAN: Discussion was had regarding regular use of glucosamine to minimize or slow down the progression of his arthritis.  Continue with lower impact weightbearing activities.  He will continue to keep his weight down.  Questions were invited and answered.  We will leave follow-up open at this point on an as-needed basis.    ORDERS TODAY:    ORDERS NEXT VISIT:    PERCENT OF TEMPORARY IMPAIRMENT:

## 2016-09-30 ENCOUNTER — Other Ambulatory Visit
Admission: RE | Admit: 2016-09-30 | Discharge: 2016-09-30 | Disposition: A | Discharge status: Home / Self Care | Payer: BLUE CROSS/BLUE SHIELD | Source: Ambulatory Visit | Attending: Psychiatry | Admitting: Psychiatry

## 2016-09-30 ENCOUNTER — Encounter: Payer: Self-pay | Admitting: Gastroenterology

## 2016-09-30 DIAGNOSIS — F1020 Alcohol dependence, uncomplicated: Secondary | ICD-10-CM | POA: Insufficient documentation

## 2016-10-09 ENCOUNTER — Other Ambulatory Visit
Admission: RE | Admit: 2016-10-09 | Discharge: 2016-10-09 | Disposition: A | Discharge status: Home / Self Care | Payer: BLUE CROSS/BLUE SHIELD | Source: Ambulatory Visit | Attending: Psychiatry | Admitting: Psychiatry

## 2016-10-09 DIAGNOSIS — F1020 Alcohol dependence, uncomplicated: Secondary | ICD-10-CM | POA: Insufficient documentation

## 2016-10-14 ENCOUNTER — Other Ambulatory Visit
Admission: RE | Admit: 2016-10-14 | Discharge: 2016-10-14 | Disposition: A | Discharge status: Home / Self Care | Payer: BLUE CROSS/BLUE SHIELD | Source: Ambulatory Visit | Attending: Psychiatry | Admitting: Psychiatry

## 2016-10-14 DIAGNOSIS — F1020 Alcohol dependence, uncomplicated: Secondary | ICD-10-CM | POA: Insufficient documentation

## 2016-10-17 ENCOUNTER — Telehealth: Payer: Self-pay | Admitting: Primary Care

## 2016-10-17 ENCOUNTER — Ambulatory Visit: Payer: BLUE CROSS/BLUE SHIELD

## 2016-10-17 NOTE — Telephone Encounter (Signed)
110/70, taken by R.Cone, MA

## 2016-10-20 NOTE — Telephone Encounter (Signed)
BP looks great

## 2016-10-21 ENCOUNTER — Other Ambulatory Visit
Admission: RE | Admit: 2016-10-21 | Discharge: 2016-10-21 | Disposition: A | Discharge status: Home / Self Care | Payer: BLUE CROSS/BLUE SHIELD | Source: Ambulatory Visit | Attending: Psychiatry | Admitting: Psychiatry

## 2016-10-21 DIAGNOSIS — F1020 Alcohol dependence, uncomplicated: Secondary | ICD-10-CM | POA: Insufficient documentation

## 2016-11-04 ENCOUNTER — Other Ambulatory Visit
Admission: RE | Admit: 2016-11-04 | Discharge: 2016-11-04 | Disposition: A | Discharge status: Home / Self Care | Payer: BLUE CROSS/BLUE SHIELD | Source: Ambulatory Visit | Attending: Psychiatry | Admitting: Psychiatry

## 2016-11-04 DIAGNOSIS — F1020 Alcohol dependence, uncomplicated: Secondary | ICD-10-CM | POA: Insufficient documentation

## 2016-11-23 ENCOUNTER — Other Ambulatory Visit: Payer: Self-pay | Admitting: Primary Care

## 2016-11-24 NOTE — Telephone Encounter (Signed)
last seen 09/17/16  Next 03/24/17  Labs 09/2016

## 2016-12-03 ENCOUNTER — Other Ambulatory Visit
Admission: RE | Admit: 2016-12-03 | Discharge: 2016-12-03 | Disposition: A | Discharge status: Home / Self Care | Payer: BLUE CROSS/BLUE SHIELD | Source: Ambulatory Visit | Attending: Psychiatry | Admitting: Psychiatry

## 2016-12-03 DIAGNOSIS — F1020 Alcohol dependence, uncomplicated: Secondary | ICD-10-CM | POA: Insufficient documentation

## 2016-12-21 ENCOUNTER — Other Ambulatory Visit: Payer: Self-pay | Admitting: Primary Care

## 2016-12-31 ENCOUNTER — Other Ambulatory Visit
Admission: RE | Admit: 2016-12-31 | Discharge: 2016-12-31 | Disposition: A | Discharge status: Home / Self Care | Payer: BLUE CROSS/BLUE SHIELD | Source: Ambulatory Visit | Attending: Primary Care | Admitting: Primary Care

## 2016-12-31 DIAGNOSIS — I10 Essential (primary) hypertension: Secondary | ICD-10-CM | POA: Insufficient documentation

## 2016-12-31 DIAGNOSIS — R7301 Impaired fasting glucose: Secondary | ICD-10-CM | POA: Insufficient documentation

## 2016-12-31 LAB — LIPID PANEL
Chol/HDL Ratio: 2.6
Cholesterol: 108 mg/dL
HDL: 42 mg/dL
LDL Calculated: 45 mg/dL
Non HDL Cholesterol: 66 mg/dL
Triglycerides: 107 mg/dL

## 2016-12-31 LAB — COMPREHENSIVE METABOLIC PANEL
ALT: 44 U/L (ref 0–50)
AST: 25 U/L (ref 0–50)
Albumin: 4.4 g/dL (ref 3.5–5.2)
Alk Phos: 54 U/L (ref 40–130)
Anion Gap: 10 (ref 7–16)
Bilirubin,Total: 0.8 mg/dL (ref 0.0–1.2)
CO2: 26 mmol/L (ref 20–28)
Calcium: 9.4 mg/dL (ref 8.6–10.2)
Chloride: 104 mmol/L (ref 96–108)
Creatinine: 1.08 mg/dL (ref 0.67–1.17)
GFR,Black: 87 *
GFR,Caucasian: 75 *
Glucose: 108 mg/dL — ABNORMAL HIGH (ref 60–99)
Lab: 18 mg/dL (ref 6–20)
Potassium: 4.7 mmol/L (ref 3.3–5.1)
Sodium: 140 mmol/L (ref 133–145)
Total Protein: 6.4 g/dL (ref 6.3–7.7)

## 2016-12-31 LAB — HEMOGLOBIN A1C: Hemoglobin A1C: 5.8 % (ref 4.0–6.0)

## 2017-01-01 ENCOUNTER — Encounter: Payer: Self-pay | Admitting: Primary Care

## 2017-01-07 ENCOUNTER — Other Ambulatory Visit
Admission: RE | Admit: 2017-01-07 | Discharge: 2017-01-07 | Disposition: A | Discharge status: Home / Self Care | Payer: BLUE CROSS/BLUE SHIELD | Source: Ambulatory Visit | Attending: Psychiatry | Admitting: Psychiatry

## 2017-01-07 DIAGNOSIS — F1020 Alcohol dependence, uncomplicated: Secondary | ICD-10-CM | POA: Insufficient documentation

## 2017-01-15 ENCOUNTER — Encounter: Payer: Self-pay | Admitting: Primary Care

## 2017-01-15 ENCOUNTER — Ambulatory Visit: Payer: BLUE CROSS/BLUE SHIELD | Attending: Primary Care | Admitting: Primary Care

## 2017-01-15 VITALS — BP 110/68 | HR 64 | Temp 98.7°F | Ht 70.0 in | Wt 176.8 lb

## 2017-01-15 DIAGNOSIS — J019 Acute sinusitis, unspecified: Secondary | ICD-10-CM

## 2017-01-15 MED ORDER — AZITHROMYCIN 250 MG PO TABS *I*
ORAL_TABLET | ORAL | 0 refills | Status: DC
Start: 2017-01-15 — End: 2017-03-25

## 2017-01-15 NOTE — Progress Notes (Signed)
Lance Peters is a 56 y.o. male   01/15/2017    No chief complaint on file.      HPI:  Lance Peters arrived for an acute appointment.    Respiratory symptoms, present for 10 days, worsening.  Started with sore throat and cough.  Now increasing sinus pressure, pain, ear pain, thick nasal discharge and postnasal drip.  Feeling feverish over the past couple of days.  Difficulty sleeping.    Also followed for hypertension and hyperlipidemia.  Medications confirmed, duplicate entries removed.    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    Bilateral carpal tunnel syndrome G56.03    Ulnar neuropathy of right upper extremity G56.21       Allergies: Penicillins    Medication list reviewed, changes made  Current Outpatient Prescriptions   Medication Sig Note    benazepril (LOTENSIN) 10 MG tablet TAKE 1 TABLET BY MOUTH EVERY DAY     amLODIPine (NORVASC) 10 MG tablet TAKE 1 TABLET BY MOUTH EVERY DAY     piroxicam (FELDENE) 20 MG capsule TAKE 1 CAPSULE BY MOUTH EVERY DAY     naltrexone (DEPADE) 50 MG tablet Take 50 mg by mouth daily 09/17/2016: Received from: External Pharmacy Received Sig: TAKE 1 TABLET BY MOUTH EVERY DAY    sertraline (ZOLOFT) 50 MG tablet Take 1 tablet (50 mg total) by mouth daily     atorvastatin (LIPITOR) 20 MG tablet TAKE 1 TABLET BY MOUTH EVERY DAY WITH DINNER     acetaminophen (TYLENOL) 325 MG tablet Take by mouth every 6 hours as needed for Pain     aspirin 81 MG tablet Take 81 mg by mouth daily     azithromycin (ZITHROMAX) 250 MG tablet Take 2 tablets (500 mg) on day 1, followed by 1 tablet (250 mg) on days 2 through 5.      No current facility-administered medications for this visit.         Allergy list reviewed  Problem list  reviewed    OBJECTIVE:  Vitals:    01/15/17 1523   BP: 110/68   Pulse: 64   Temp: 37.1 C (98.7 F)   Weight: 80.2 kg (176 lb 12.8 oz)   Height: 1.778 m (5\' 10" )       GENERAL:  Alert, no distress  HEENT: No ocular discharge.  TM's pearly with normal anatomy bilaterally.  Nares patent. Oropharynx clear with moist mucous membranes.  Postnasal drip minimal  NECK:  Trachea midline, thyroid gland normal, without masses; no significant adenopathy  LUNGS:  Normal respiratory effort, Clear to auscultation bilaterally, without wheezes or crackles    ASSESSMENT/PLAN:  Sinusitis-symptoms worsening, apparently developing fever, will treat with azithromycin (allergic to penicillin; has tolerated azithromycin in the past.)  Call for worsening or failure to improve.    Medication instructions were discussed with patient (and advocate). Advised that written instructions will also be provided by the pharmacy. Anticipated side effects discussed. Encouraged patient to call with any questions or concerns.

## 2017-01-28 ENCOUNTER — Other Ambulatory Visit
Admission: RE | Admit: 2017-01-28 | Discharge: 2017-01-28 | Disposition: A | Discharge status: Home / Self Care | Payer: BLUE CROSS/BLUE SHIELD | Source: Ambulatory Visit | Attending: Psychiatry | Admitting: Psychiatry

## 2017-01-28 DIAGNOSIS — F1020 Alcohol dependence, uncomplicated: Secondary | ICD-10-CM | POA: Insufficient documentation

## 2017-02-18 ENCOUNTER — Other Ambulatory Visit: Payer: Self-pay | Admitting: Primary Care

## 2017-02-18 ENCOUNTER — Other Ambulatory Visit
Admission: RE | Admit: 2017-02-18 | Discharge: 2017-02-18 | Disposition: A | Discharge status: Home / Self Care | Payer: BLUE CROSS/BLUE SHIELD | Source: Ambulatory Visit | Attending: Psychiatry | Admitting: Psychiatry

## 2017-02-18 DIAGNOSIS — F1020 Alcohol dependence, uncomplicated: Secondary | ICD-10-CM | POA: Insufficient documentation

## 2017-02-18 NOTE — Telephone Encounter (Signed)
LOV: 10.11.18  NOV: 12.12.18

## 2017-03-12 ENCOUNTER — Other Ambulatory Visit
Admission: RE | Admit: 2017-03-12 | Discharge: 2017-03-12 | Disposition: A | Discharge status: Home / Self Care | Payer: BLUE CROSS/BLUE SHIELD | Source: Ambulatory Visit | Attending: Psychiatry | Admitting: Psychiatry

## 2017-03-12 DIAGNOSIS — F1020 Alcohol dependence, uncomplicated: Secondary | ICD-10-CM | POA: Insufficient documentation

## 2017-03-18 ENCOUNTER — Ambulatory Visit: Payer: BLUE CROSS/BLUE SHIELD | Admitting: Primary Care

## 2017-03-19 ENCOUNTER — Ambulatory Visit: Payer: BLUE CROSS/BLUE SHIELD | Admitting: Primary Care

## 2017-03-20 ENCOUNTER — Other Ambulatory Visit
Admission: RE | Admit: 2017-03-20 | Discharge: 2017-03-20 | Disposition: A | Discharge status: Home / Self Care | Payer: BLUE CROSS/BLUE SHIELD | Source: Ambulatory Visit | Attending: Primary Care | Admitting: Primary Care

## 2017-03-20 DIAGNOSIS — R7301 Impaired fasting glucose: Secondary | ICD-10-CM | POA: Insufficient documentation

## 2017-03-20 DIAGNOSIS — I10 Essential (primary) hypertension: Secondary | ICD-10-CM | POA: Insufficient documentation

## 2017-03-20 LAB — BASIC METABOLIC PANEL
Anion Gap: 12 (ref 7–16)
CO2: 25 mmol/L (ref 20–28)
Calcium: 9.2 mg/dL (ref 8.6–10.2)
Chloride: 104 mmol/L (ref 96–108)
Creatinine: 0.94 mg/dL (ref 0.67–1.17)
GFR,Black: 103 *
GFR,Caucasian: 89 *
Glucose: 108 mg/dL — ABNORMAL HIGH (ref 60–99)
Lab: 16 mg/dL (ref 6–20)
Potassium: 4.8 mmol/L (ref 3.3–5.1)
Sodium: 141 mmol/L (ref 133–145)

## 2017-03-20 LAB — HEMOGLOBIN A1C: Hemoglobin A1C: 5.6 %

## 2017-03-25 ENCOUNTER — Ambulatory Visit: Payer: BLUE CROSS/BLUE SHIELD | Attending: Primary Care | Admitting: Primary Care

## 2017-03-25 VITALS — BP 130/70 | HR 50 | Ht 70.0 in | Wt 169.0 lb

## 2017-03-25 DIAGNOSIS — R7301 Impaired fasting glucose: Secondary | ICD-10-CM

## 2017-03-25 DIAGNOSIS — E785 Hyperlipidemia, unspecified: Secondary | ICD-10-CM

## 2017-03-25 DIAGNOSIS — I10 Essential (primary) hypertension: Secondary | ICD-10-CM

## 2017-03-25 NOTE — Progress Notes (Signed)
Lance Peters is a 56 y.o. male   03/25/2017    No chief complaint on file.      HPI:  Lance Peters arrived for a scheduled follow up appointment.     Here for follow-up of chronic medical conditions which include hypertension, hyperlipidemia, impaired fasting glucose.    He is now about 10 months without drinking any alcohol.  He is taking naltrexone to help with this maintenance, planning to take it for a full year.  He's lost significant weight, he figures his current weight is about where he would like to settle.    Blood pressure is on target.  Recent labs show normal hemoglobin A1c, mildly elevated glucose.  Everything else normal.    His medications are as listed, not missing any doses.  No side effects.    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    Bilateral carpal tunnel syndrome G56.03    Ulnar neuropathy of right upper extremity G56.21       Allergies: Penicillins    Medication list reviewed, changes made  Current Outpatient Prescriptions   Medication Sig Note    sertraline (ZOLOFT) 50 MG tablet TAKE 1 TABLET BY MOUTH EVERY DAY     benazepril (LOTENSIN) 10 MG tablet TAKE 1 TABLET BY MOUTH EVERY DAY     amLODIPine (NORVASC) 10 MG tablet TAKE 1 TABLET BY MOUTH EVERY DAY     piroxicam (FELDENE) 20 MG capsule TAKE 1 CAPSULE BY MOUTH EVERY DAY     naltrexone (DEPADE) 50 MG tablet Take 50 mg by mouth daily 09/17/2016: Received from: External Pharmacy Received Sig: TAKE 1 TABLET BY MOUTH EVERY DAY    atorvastatin (LIPITOR) 20 MG tablet TAKE 1 TABLET BY MOUTH EVERY DAY WITH DINNER     aspirin 81 MG tablet Take 81 mg by mouth daily     acetaminophen (TYLENOL) 325 MG tablet Take by mouth every 6 hours as needed for Pain      No current  facility-administered medications for this visit.         Allergy list reviewed  Problem list reviewed    OBJECTIVE:  Vitals:    03/25/17 1549   BP: 130/70   Pulse: 50   Weight: 76.7 kg (169 lb)   Height: 1.778 m (5\' 10" )     Alert, oriented, appears well.  Speaking and breathing comfortably.    Recent Results (from the past 336 hour(s))   Basic metabolic panel    Collection Time: 03/20/17  9:08 AM   Result Value Ref Range    Glucose 108 (H) 60 - 99 mg/dL    Sodium 141 133 - 145 mmol/L    Potassium 4.8 3.3 - 5.1 mmol/L    Chloride 104 96 - 108 mmol/L    CO2 25 20 - 28 mmol/L    Anion Gap 12 7 - 16    UN 16 6 - 20 mg/dL    Creatinine 0.94 0.67 - 1.17 mg/dL    GFR,Caucasian 89 *    GFR,Black 103 *    Calcium 9.2 8.6 - 10.2 mg/dL   Hemoglobin A1c    Collection Time: 03/20/17  9:08 AM   Result Value Ref Range    Hemoglobin A1C 5.6 %          ASSESSMENT/PLAN:  Impaired fasting glucose-continued slow improvement in glycemic control, without medication.  Congratulated him on his weight loss and other lifestyle interventions, recommended continuation.    Hypertension-blood pressure is on target, continue current medicines.  It's conceivable we will be able to back off on some medicines in the future as he continues with his lifestyle interventions.    His lipidemia-appropriate statin therapy, continue for now.    Plan to check labs in 3 months with my chart follow-up, again in 6 months with appointment.    Medication instructions were discussed with patient (and advocate). Advised that written instructions will also be provided by the pharmacy. Anticipated side effects discussed. Encouraged patient to call with any questions or concerns.

## 2017-04-02 ENCOUNTER — Other Ambulatory Visit
Admission: RE | Admit: 2017-04-02 | Discharge: 2017-04-02 | Disposition: A | Discharge status: Home / Self Care | Payer: BLUE CROSS/BLUE SHIELD | Source: Ambulatory Visit | Attending: Psychiatry | Admitting: Psychiatry

## 2017-04-02 DIAGNOSIS — F1020 Alcohol dependence, uncomplicated: Secondary | ICD-10-CM | POA: Insufficient documentation

## 2017-04-24 ENCOUNTER — Encounter: Payer: Self-pay | Admitting: Gastroenterology

## 2017-06-17 ENCOUNTER — Other Ambulatory Visit
Admission: RE | Admit: 2017-06-17 | Discharge: 2017-06-17 | Disposition: A | Discharge status: Home / Self Care | Payer: BLUE CROSS/BLUE SHIELD | Source: Ambulatory Visit | Attending: Psychiatry | Admitting: Psychiatry

## 2017-06-17 DIAGNOSIS — F1020 Alcohol dependence, uncomplicated: Secondary | ICD-10-CM | POA: Insufficient documentation

## 2017-07-05 ENCOUNTER — Other Ambulatory Visit: Payer: Self-pay | Admitting: Primary Care

## 2017-07-16 ENCOUNTER — Other Ambulatory Visit
Admission: RE | Admit: 2017-07-16 | Discharge: 2017-07-16 | Disposition: A | Discharge status: Home / Self Care | Payer: BLUE CROSS/BLUE SHIELD | Source: Ambulatory Visit | Attending: Psychiatry | Admitting: Psychiatry

## 2017-07-16 DIAGNOSIS — F1020 Alcohol dependence, uncomplicated: Secondary | ICD-10-CM | POA: Insufficient documentation

## 2017-07-25 ENCOUNTER — Other Ambulatory Visit: Payer: Self-pay | Admitting: Primary Care

## 2017-07-27 MED ORDER — PIROXICAM 20 MG PO CAPS *I*
20.0000 mg | ORAL_CAPSULE | Freq: Every day | ORAL | 5 refills | Status: DC
Start: 2017-07-27 — End: 2018-01-06

## 2017-08-06 ENCOUNTER — Other Ambulatory Visit: Payer: Self-pay | Admitting: Primary Care

## 2017-08-16 ENCOUNTER — Encounter: Payer: Self-pay | Admitting: Family

## 2017-08-16 ENCOUNTER — Ambulatory Visit
Admission: AD | Admit: 2017-08-16 | Discharge: 2017-08-16 | Disposition: A | Discharge status: Home / Self Care | Payer: BLUE CROSS/BLUE SHIELD | Source: Ambulatory Visit | Attending: Physician Assistant | Admitting: Physician Assistant

## 2017-08-16 DIAGNOSIS — L03114 Cellulitis of left upper limb: Secondary | ICD-10-CM

## 2017-08-16 DIAGNOSIS — W57XXXA Bitten or stung by nonvenomous insect and other nonvenomous arthropods, initial encounter: Secondary | ICD-10-CM

## 2017-08-16 LAB — HM HIV SCREENING OFFERED

## 2017-08-16 MED ORDER — DOXYCYCLINE MONOHYDRATE 100 MG PO CAPS *I*
100.0000 mg | ORAL_CAPSULE | Freq: Two times a day (BID) | ORAL | 0 refills | Status: AC
Start: 2017-08-16 — End: 2017-08-26

## 2017-08-16 NOTE — Discharge Instructions (Signed)
Please complete antibiotic course as prescribed even if you start to feel better or the redness resolves.    Continue to monitor the area.  If it becomes increasingly red, painful, swollen, or if you develop a fever your should proceed to the Emergency Department for further evaluation.      Keep area clean, wash gently with mild soap twice a day    Redness should get better in approximately 48 hours    Watch for any signs of Lyme Disease such as   -Fever  -Chills  -Body Aches  -Malaise  -Fatigue  -Joint pain  -Headache

## 2017-08-16 NOTE — UC Provider Note (Signed)
History     Chief Complaint   Patient presents with    Tick Removal     Pt was [place] hunting yesterday and woke up this am with red bulls eye rash on the left forearm. Wanted to come in and get checked for tick bite, no tick in teh arm. denies any fevers, chill, body aches.      57 year old male patient presents with rash on left forearm x 1 day. 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 He states he was also outside ding yard work. Patient does not know what kind of bite bit him. However he woke up with bullseye rash on left forearm. Area is warm and red. Rash is not itchy. Patient denies fever, chills, nausea, vomiting, body aches, headache, muscle pain, fatigue, or joint pain. Patient denies any OTC medications or creams.        History provided by:  Patient  Language interpreter used: No        Medical/Surgical/Family History     Past Medical History:   Diagnosis Date    Arthritis     elbows and shoulders     Chest pain, unspecified     Depression 05/23/2014    Neuromuscular disorder     elbows, shoulders    Unspecified essential hypertension 06/02/2013        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    Bilateral carpal tunnel syndrome G56.03    Ulnar neuropathy of right upper extremity G56.21            Past Surgical History:   Procedure Laterality Date    HAND SURGERY      trigger finger release     PR ARTHRS KNE SURG W/MENISCECTOMY MED/LAT W/SHVG Right 07/16/2016    Procedure: KNEE ARTHROSCOPY WITH PARTIAL MEDIAL MENISCECTOMY;  Surgeon: Jannifer Franklin, MD;  Location: [facility];  Service: Orthopedics     Family History   Problem Relation Age of Onset    Colon polyps Mother      Cancer Mother     Colon cancer Neg Hx           Social History   Substance Use Topics    Smoking status: Never Smoker    Smokeless tobacco: Never Used    Alcohol use No     Living Situation     Questions Responses    Patient lives with     Homeless     Caregiver for other family member     External Services     Employment     Domestic Violence Risk                 Review of Systems   Review of Systems   Constitutional: Negative for chills, fatigue and fever.   HENT: Negative.    Eyes: Negative.    Respiratory: Negative for cough and shortness of breath.    Cardiovascular: Negative for chest pain.   Gastrointestinal: Negative for abdominal pain, diarrhea, nausea and vomiting.   Endocrine: Negative.    Genitourinary: Negative.    Musculoskeletal:  Negative for arthralgias, joint swelling and myalgias.   Skin: Positive for rash.   Allergic/Immunologic: Negative.  Negative for immunocompromised state.   Neurological: Negative for weakness, light-headedness and headaches.   Hematological: Negative.    Psychiatric/Behavioral: Negative.        Physical Exam   Triage Vitals  Triage Start: Start, (08/16/17 0914)   First Recorded BP: 118/74, Temp: 36.5 C (97.7 F) Oxygen Therapy SpO2: 98 %, Heart Rate: 55, (08/16/17 0915)  .      Physical Exam   Constitutional: He is oriented to person, place, and time. He appears well-developed and well-nourished. He is cooperative.  Non-toxic appearance. He does not have a sickly appearance. He does not appear ill. No distress.   HENT:   Head: Normocephalic and atraumatic.   Right Ear: Tympanic membrane normal.   Left Ear: Tympanic membrane normal.   Nose: Nose normal.   Mouth/Throat: Uvula is midline, oropharynx is clear and moist and mucous membranes are normal.   Eyes: Pupils are equal, round, and reactive to light. Conjunctivae and EOM are normal.   Neck: Normal range of motion.   Cardiovascular: Normal rate, regular rhythm, normal heart sounds and intact distal pulses.     Pulmonary/Chest: Effort normal and breath sounds normal.   Abdominal: Soft.   Musculoskeletal: Normal range of motion.   Lymphadenopathy:     He has no cervical adenopathy.   Neurological: He is alert and oriented to person, place, and time.   Skin: Skin is warm and dry. Rash (bulls-eye rash pn posterior left foearm) noted. No bruising and no ecchymosis noted. Rash is not vesicular. He is not diaphoretic. There is erythema.        Psychiatric: He has a normal mood and affect. His behavior is normal. Judgment and thought content normal.   Nursing note and vitals reviewed.       Medical Decision Making        Initial Evaluation:  ED First Provider Contact     Date/Time Event User Comments    08/16/17 [phone] ED First Provider Contact [facility], Linkyn Gobin Initial Face to Face Provider Contact          Patient was seen on: 08/16/2017        Assessment:  57 y.o.male comes to the Urgent [place] [facility] with bug bite and cellulitis x 1 day. Patient is afebrile and in no acute distress.    Differential Diagnosis includes:  Skin infection  Cellulitis  Abscess  Dermatitis    Plan: Please complete antibiotic course as prescribed even if you start to feel better or the redness resolves.    Continue to monitor the area.  If it becomes increasingly red, painful, swollen, or if you develop a fever your should proceed to the Emergency Department for further evaluation.      Keep area clean, wash gently with mild soap twice a day    Redness should get better in approximately 48 hours    Watch for any signs of Lyme Disease such as   -Fever  -Chills  -Body Aches  -Malaise  -Fatigue  -Joint pain  -Headache         Final Diagnosis  Final diagnoses:   [L03.114] Cellulitis of left upper extremity (Primary)   [F57.XXXA] Bug bite, initial encounter         Ardelle Lesches, [place]              Gregor Hams [place], [place]  08/16/17 [phone]

## 2017-08-16 NOTE — ED Triage Notes (Signed)
Pt was [place] hunting yesterday and woke up this am with red bulls eye rash on the left forearm. Wanted to come in and get checked for tick bite, no tick in teh arm. denies any fevers, chill, body aches.        Triage Note   Franne Forts, RN

## 2017-08-24 ENCOUNTER — Other Ambulatory Visit
Admission: RE | Admit: 2017-08-24 | Discharge: 2017-08-24 | Disposition: A | Discharge status: Home / Self Care | Payer: BLUE CROSS/BLUE SHIELD | Source: Ambulatory Visit | Attending: Psychiatry | Admitting: Psychiatry

## 2017-08-24 DIAGNOSIS — F1020 Alcohol dependence, uncomplicated: Secondary | ICD-10-CM | POA: Insufficient documentation

## 2017-09-07 ENCOUNTER — Other Ambulatory Visit
Admission: RE | Admit: 2017-09-07 | Discharge: 2017-09-07 | Disposition: A | Discharge status: Home / Self Care | Payer: BLUE CROSS/BLUE SHIELD | Source: Ambulatory Visit | Attending: Primary Care | Admitting: Primary Care

## 2017-09-07 DIAGNOSIS — R7301 Impaired fasting glucose: Secondary | ICD-10-CM | POA: Insufficient documentation

## 2017-09-07 DIAGNOSIS — E785 Hyperlipidemia, unspecified: Secondary | ICD-10-CM | POA: Insufficient documentation

## 2017-09-07 DIAGNOSIS — I10 Essential (primary) hypertension: Secondary | ICD-10-CM | POA: Insufficient documentation

## 2017-09-07 LAB — COMPREHENSIVE METABOLIC PANEL
ALT: 41 U/L (ref 0–50)
AST: 27 U/L (ref 0–50)
Albumin: 4.4 g/dL (ref 3.5–5.2)
Alk Phos: 57 U/L (ref 40–130)
Anion Gap: 13 (ref 7–16)
Bilirubin,Total: 0.8 mg/dL (ref 0.0–1.2)
CO2: 25 mmol/L (ref 20–28)
Calcium: 9.8 mg/dL (ref 8.6–10.2)
Chloride: 105 mmol/L (ref 96–108)
Creatinine: 0.93 mg/dL (ref 0.67–1.17)
GFR,Black: 104 *
GFR,Caucasian: 90 *
Glucose: 127 mg/dL — ABNORMAL HIGH (ref 60–99)
Lab: 16 mg/dL (ref 6–20)
Potassium: 4.7 mmol/L (ref 3.3–5.1)
Sodium: 143 mmol/L (ref 133–145)
Total Protein: 6.2 g/dL — ABNORMAL LOW (ref 6.3–7.7)

## 2017-09-07 LAB — LIPID PANEL
Chol/HDL Ratio: 2.5
Cholesterol: 119 mg/dL
HDL: 47 mg/dL
LDL Calculated: 56 mg/dL
Non HDL Cholesterol: 72 mg/dL
Triglycerides: 82 mg/dL

## 2017-09-07 LAB — HEMOGLOBIN A1C: Hemoglobin A1C: 5.8 % — ABNORMAL HIGH

## 2017-09-15 ENCOUNTER — Ambulatory Visit: Payer: BLUE CROSS/BLUE SHIELD | Admitting: Orthopedic Surgery

## 2017-09-15 ENCOUNTER — Other Ambulatory Visit: Payer: Self-pay | Admitting: Orthopedic Surgery

## 2017-09-15 ENCOUNTER — Ambulatory Visit
Admission: RE | Admit: 2017-09-15 | Discharge: 2017-09-15 | Disposition: A | Discharge status: Home / Self Care | Payer: BLUE CROSS/BLUE SHIELD | Source: Ambulatory Visit | Attending: Orthopedic Surgery | Admitting: Orthopedic Surgery

## 2017-09-15 ENCOUNTER — Other Ambulatory Visit
Admission: RE | Admit: 2017-09-15 | Discharge: 2017-09-15 | Disposition: A | Discharge status: Home / Self Care | Payer: BLUE CROSS/BLUE SHIELD | Source: Ambulatory Visit | Attending: Psychiatry | Admitting: Psychiatry

## 2017-09-15 ENCOUNTER — Encounter: Payer: Self-pay | Admitting: Orthopedic Surgery

## 2017-09-15 VITALS — BP 115/64 | HR 58 | Ht 70.0 in | Wt 165.0 lb

## 2017-09-15 DIAGNOSIS — M25462 Effusion, left knee: Secondary | ICD-10-CM

## 2017-09-15 DIAGNOSIS — M170 Bilateral primary osteoarthritis of knee: Secondary | ICD-10-CM

## 2017-09-15 DIAGNOSIS — M25561 Pain in right knee: Secondary | ICD-10-CM

## 2017-09-15 DIAGNOSIS — Z9889 Other specified postprocedural states: Secondary | ICD-10-CM

## 2017-09-15 DIAGNOSIS — M25461 Effusion, right knee: Secondary | ICD-10-CM

## 2017-09-15 DIAGNOSIS — F1020 Alcohol dependence, uncomplicated: Secondary | ICD-10-CM | POA: Insufficient documentation

## 2017-09-15 DIAGNOSIS — G8929 Other chronic pain: Secondary | ICD-10-CM | POA: Insufficient documentation

## 2017-09-15 DIAGNOSIS — S83231A Complex tear of medial meniscus, current injury, right knee, initial encounter: Secondary | ICD-10-CM

## 2017-09-15 DIAGNOSIS — M25562 Pain in left knee: Secondary | ICD-10-CM

## 2017-09-15 MED ORDER — LIDOCAINE HCL 1 % IJ SOLN *I*
5.0000 mL | Freq: Once | INTRAMUSCULAR | Status: AC | PRN
Start: 2017-09-15 — End: 2017-09-15
  Administered 2017-09-15: 5 mL via INTRA_ARTICULAR

## 2017-09-15 MED ORDER — BETAMETHASONE ACET & SOD PHOS 6 (3-3) MG/ML IJ SUSP *I*
12.0000 mg | Freq: Once | INTRAMUSCULAR | Status: AC | PRN
Start: 2017-09-15 — End: 2017-09-15
  Administered 2017-09-15: 12 mg via INTRA_ARTICULAR

## 2017-09-15 NOTE — Progress Notes (Signed)
CHIEF COMPLAINT:follow-up bilateral knees    INTERVAL HISTORY:patient presents today now just over 1 year following his right knee arthroscopy with partial medial meniscectomy.  intraoperative findings also showing some diffuse grade 2-3 changes through the trochlea.    he states that his right knee was doing very well up until recently when he again had a subtle twisting injury and since then has had mechanical pain and intermittent swelling.  He points along the medial joint line as the source of this discomfort and feels quite similar to his pre-arthroscopy symptoms.  Therapeutic exercise and anti-inflammatory medications have failed to relieve his symptoms.    He has also noted an increase in his achy pain in his left knee.    PFSH:  Since last visit no change.    ROS:  Since last visit no change.    MEDICATION:  Since last visit no new meds.    ALLERGIES:  As per initial evaluation.    PHYSICAL EXAM:his knees today do not show any increased warmth or erythema.  He can extend both knees actively to 0.  Flexion is to 120.  Mild patellofemoral crepitus.  There is tenderness along the medial joint line of the right knee.  No instability or pain with varus or valgus stress.  There is pain with circumduction maneuvers.  Positive McMurray sign on the right.  His calves are soft and nontender.  CMS checks are grossly intact distally.    IMAGING:I did order 4 views of both knees today and these were reviewed.  These do not show any obvious acute bony abnormalities.  Mild medial compartment narrowing on the right side.  Joint space is otherwise fairly well maintained.    ASSESSMENT:concern for re-tear of the medial meniscus of the right knee with flare of left knee arthritis    PLAN:we did offer and provided cortisone injection for his left knee.  Please refer to procedure note for details.    We do request approval to perform an MRI scan of the right knee to look for evidence of re-tearing of his medial meniscus.   Follow-up after the MRI.  He will call prior to that appointment with any problems or questions.    ORDERS TODAY:OA series both knees    ORDERS NEXT VISIT:    PERCENT OF TEMPORARY IMPAIRMENT:

## 2017-09-15 NOTE — Procedures (Signed)
Large Joint Aspiration/Injection Procedure    Date/Time: 09/15/2017 3:07 PM  Consent given by: patient  Site marked: site marked  Timeout: Immediately prior to procedure a time out was called to verify the correct patient, procedure, equipment, support staff and site/side marked as required     Procedure Details    Location: knee - L knee intra - articular  Preparation: The site was prepped using the usual aseptic technique.  Anesthetics administered: 5 mL lidocaine hcl 1 %  Intra-Articular Steroids administered: 12 mg betamethasone acetate & sodium phosphate 6 (3-3) MG/ML  Dressing:  A dry, sterile dressing was applied.  Patient tolerance: patient tolerated the procedure well with no immediate complications

## 2017-09-24 ENCOUNTER — Encounter: Payer: Self-pay | Admitting: Primary Care

## 2017-09-24 ENCOUNTER — Ambulatory Visit: Payer: BLUE CROSS/BLUE SHIELD | Attending: Primary Care | Admitting: Primary Care

## 2017-09-24 ENCOUNTER — Ambulatory Visit
Admission: RE | Admit: 2017-09-24 | Discharge: 2017-09-24 | Disposition: A | Discharge status: Home / Self Care | Payer: BLUE CROSS/BLUE SHIELD | Source: Ambulatory Visit

## 2017-09-24 VITALS — BP 110/74 | HR 66 | Ht 70.0 in | Wt 169.8 lb

## 2017-09-24 DIAGNOSIS — S83231A Complex tear of medial meniscus, current injury, right knee, initial encounter: Secondary | ICD-10-CM

## 2017-09-24 DIAGNOSIS — E785 Hyperlipidemia, unspecified: Secondary | ICD-10-CM

## 2017-09-24 DIAGNOSIS — M25461 Effusion, right knee: Secondary | ICD-10-CM

## 2017-09-24 DIAGNOSIS — M23321 Other meniscus derangements, posterior horn of medial meniscus, right knee: Secondary | ICD-10-CM

## 2017-09-24 DIAGNOSIS — M238X1 Other internal derangements of right knee: Secondary | ICD-10-CM

## 2017-09-24 DIAGNOSIS — I10 Essential (primary) hypertension: Secondary | ICD-10-CM

## 2017-09-24 DIAGNOSIS — R7301 Impaired fasting glucose: Secondary | ICD-10-CM

## 2017-09-24 DIAGNOSIS — M2341 Loose body in knee, right knee: Secondary | ICD-10-CM

## 2017-09-24 NOTE — Progress Notes (Signed)
Lance Peters is a 57 y.o. male   09/24/2017    No chief complaint on file.      HPI:  Lance Peters arrived for a scheduled follow up appointment.    Here for routine follow-up, chronic medical problems include impaired fasting glucose, hypertension, high cholesterol.  Medicines are as listed.  He's taking minimal as described, not missing any doses, no side effects.    He is working with Dr. Noland Fordyce regarding some ongoing knee pain issues, consider need for surgery.     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    Bilateral carpal tunnel syndrome G56.03    Ulnar neuropathy of right upper extremity G56.21       Allergies: Penicillins    Medication list reviewed, changes made  Current Outpatient Prescriptions   Medication Sig    sertraline (ZOLOFT) 50 MG tablet TAKE 1 TABLET BY MOUTH EVERY DAY    piroxicam (FELDENE) 20 MG capsule Take 1 capsule (20 mg total) by mouth daily    atorvastatin (LIPITOR) 20 MG tablet TAKE 1 TABLET BY MOUTH EVERY DAY WITH DINNER    benazepril (LOTENSIN) 10 MG tablet TAKE 1 TABLET BY MOUTH EVERY DAY    amLODIPine (NORVASC) 10 MG tablet TAKE 1 TABLET BY MOUTH EVERY DAY    acetaminophen (TYLENOL) 325 MG tablet Take by mouth every 6 hours as needed for Pain    aspirin 81 MG tablet Take 81 mg by mouth daily     No current facility-administered medications for this visit.         Allergy list reviewed  Problem list reviewed    OBJECTIVE:  Vitals:    09/24/17 1528   BP: 110/74   Pulse: 66   Weight: 77 kg (169 lb 12.8 oz)   Height: 1.778 m ('5\' 10"' )     Alert, oriented, appears well.  Speaking and breathing comfortably.    Component      Latest Ref Rng & Units 09/07/2017   Sodium      133 - 145 mmol/L 143   Potassium      3.3 - 5.1  mmol/L 4.7   Chloride      96 - 108 mmol/L 105   CO2      20 - 28 mmol/L 25   Anion Gap      7 - 16 13   UN      6 - 20 mg/dL 16   Creatinine      0.67 - 1.17 mg/dL 0.93   GFR,Caucasian      * 90   GFR,Black      * 104   Glucose      60 - 99 mg/dL 127 (H)   Calcium      8.6 - 10.2 mg/dL 9.8   Total Protein      6.3 - 7.7 g/dL 6.2 (L)   Albumin      3.5 - 5.2 g/dL 4.4   Bilirubin,Total      0.0 - 1.2 mg/dL 0.8   AST      0 - 50 U/L 27   ALT      0 -  50 U/L 41   Alk Phos      40 - 130 U/L 57   Cholesterol      mg/dL 119   Triglycerides      mg/dL 82   HDL Cholesterol      mg/dL 47   LDL Calculated      mg/dL 56   Non HDL Cholesterol      mg/dL 72   Chol/HDL Ratio       2.5   Hemoglobin A1C      % 5.8 (H)     ASSESSMENT/PLAN:  Impaired fasting glucose-continue current dietary interventions, maintenance of healthy body weight, daily physical activity as much as possible.  Medication not required.  Recheck labs and follow-up 6 months.    Hypertension-blood pressure is on target.  He is compliant with medication which includes an ACE inhibitor.  No changes.    Dyslipidemia-appropriate statin intensity, continue.    Medication instructions were discussed with patient (and advocate). Advised that written instructions will also be provided by the pharmacy. Anticipated side effects discussed. Encouraged patient to call with any questions or concerns.

## 2017-10-05 ENCOUNTER — Other Ambulatory Visit
Admission: RE | Admit: 2017-10-05 | Discharge: 2017-10-05 | Disposition: A | Discharge status: Home / Self Care | Payer: BLUE CROSS/BLUE SHIELD | Source: Ambulatory Visit | Attending: Psychiatry | Admitting: Psychiatry

## 2017-10-05 DIAGNOSIS — F1020 Alcohol dependence, uncomplicated: Secondary | ICD-10-CM | POA: Insufficient documentation

## 2017-10-06 ENCOUNTER — Encounter: Payer: Self-pay | Admitting: Sports Medicine

## 2017-10-06 ENCOUNTER — Ambulatory Visit: Payer: BLUE CROSS/BLUE SHIELD | Attending: Orthopedic Surgery | Admitting: Sports Medicine

## 2017-10-06 VITALS — BP 113/66 | HR 53 | Ht 70.0 in | Wt 165.0 lb

## 2017-10-06 DIAGNOSIS — M1711 Unilateral primary osteoarthritis, right knee: Secondary | ICD-10-CM

## 2017-10-06 MED ORDER — ETHYL CHLORIDE EX AERO (MULTIPLE PATIENTS) *I*
1.0000 | INHALATION_SPRAY | Freq: Once | CUTANEOUS | Status: AC | PRN
Start: 2017-10-06 — End: 2017-10-06
  Administered 2017-10-06: 1 via TOPICAL

## 2017-10-06 MED ORDER — BETAMETHASONE ACET & SOD PHOS 6 (3-3) MG/ML IJ SUSP *I*
12.0000 mg | Freq: Once | INTRAMUSCULAR | Status: AC | PRN
Start: 2017-10-06 — End: 2017-10-06
  Administered 2017-10-06: 12 mg via INTRA_ARTICULAR

## 2017-10-06 MED ORDER — LIDOCAINE HCL 1 % IJ SOLN *I*
5.0000 mL | Freq: Once | INTRAMUSCULAR | Status: AC | PRN
Start: 2017-10-06 — End: 2017-10-06
  Administered 2017-10-06: 5 mL via INTRA_ARTICULAR

## 2017-10-06 NOTE — Progress Notes (Signed)
CHIEF COMPLAINT: Follow-up right knee    INTERVAL HISTORY: Patient returns for follow-up of his right knee.  He is over 1 year following his right knee arthroscopy with partial medial meniscectomy.  He did have a twisting injury several months ago he noted a recurrence of his similar medial, mechanical pain.    He continues to describe achy, weightbearing pain in the right knee with occasional sharper medial sided pain.    He continues with his piroxicam.        PFSH:  Since last visit no change.    ROS:  Since last visit no change.    MEDICATION:  Since last visit no new meds.    ALLERGIES:  As per initial evaluation.    PHYSICAL EXAM: His right knee today does not show any effusion.  He has no erythema.  Extension actively to 0.  Flexion is to 120.  There is no instability or pain with varus or valgus stress.  Good motion about the right hip without reproduction of pain.  There is tenderness medially at the joint line which does worsen with circumduction maneuvers.  His calf is soft and nontender.    IMAGING: Dr. Noland Fordyce and I did review his MRI scan.  This does show areas of grade 4 change through the medial compartment, primarily involving the tibial plateau.  No evidence for re-tear of the medial meniscus.    ASSESSMENT: Progressive symptoms of medial compartment right knee arthritis    PLAN: Dr. Noland Fordyce did see and examine the patient today as well.  Cortisone injection was offered and provided.  Please refer to procedure note for details.    We did request approval for Synvisc 1 injection for his ongoing symptoms of primary right knee arthritis.  Follow-up after approval.    We will obtain an unloader brace for his medial compartment arthritis as well.    ORDERS TODAY:    ORDERS NEXT VISIT:    PERCENT OF TEMPORARY IMPAIRMENT:

## 2017-10-06 NOTE — Procedures (Signed)
Large Joint Aspiration/Injection Procedure    Date/Time: 10/06/2017 9:18 AM  Consent given by: patient  Site marked: site marked  Timeout: Immediately prior to procedure a time out was called to verify the correct patient, procedure, equipment, support staff and site/side marked as required     Procedure Details    Location: knee - R knee intra - articular  Preparation: The site was prepped using the usual aseptic technique.  Anesthetics administered: 1 spray ethyl chloride; 5 mL lidocaine hcl 1 %  Intra-Articular Steroids administered: 12 mg betamethasone acetate & sodium phosphate 6 (3-3) MG/ML  Dressing:  A dry, sterile dressing was applied.  Patient tolerance: patient tolerated the procedure well with no immediate complications

## 2017-10-12 NOTE — Progress Notes (Signed)
PATIENTRANDLE, Lance Peters  MR #:  [id number]   CSN:  [id number] DOB:  01/10/1961   DICTATED BY:  Lance Pea, MD DATE OF VISIT:  10/06/2017     HISTORY OF PRESENT ILLNESS:  Lance Peters is seen and examined today for his right knee in conjunction with Lance Peters.  History, physical exam, MRI findings, assessment, and plan are as per his note.  He is now 1 year out from his right knee arthroscopy, partial medial meniscectomy.  Several months ago, he twisted his knee and has had similar medial mechanical symptoms since then.  Also has weightbearing aching pain in his knee.    PHYSICAL EXAMINATION:  There is no effusion.  No warmth or erythema.  Range from 0 to 120 degrees.  He does have tenderness over the medial joint line and pain with medial McMurray maneuver.  No ligamentous instability.  Symmetric range of motion of the hips, which does not recreate knee pain.    IMAGING:  MRI shows grade 4 changes in the medial compartment, particularly the more medial aspect of the tibial plateau with associated bone edema underneath this.  There is no obvious new tearing of the medial meniscus.    IMPRESSION:  Worsening medial compartment arthritis status post partial medial meniscectomy.    PLAN:  A long discussion was held with him regarding his findings.  Discussed there is a very limited role for arthroscopic surgery with this.  Potentially this may go on to need a joint arthroplasty, but would definitely recommend nonoperative measures first.  He is taking oral anti-inflammatories already.  A corticosteroid injection was offered and done today as per above note.  Would recommend viscosupplementation and we will schedule a Synvisc-One injection once we get approval.  I have ordered an unloader brace.  Questions invited and answered.       Well over 30 minutes of evaluation and management time was spent today with virtually all this in education and counseling, and reviewing his findings and treatment options and limitations.   This was subsequently followed by the corticosteroid injection for a total of 40 minutes face-to-face time.             ______________________________  Lance Pea, MD    LMR/MODL  DD:  10/12/2017 07:29:05  DT:  10/12/2017 07:55:35  Job #:  [id number]    cc:

## 2017-10-21 ENCOUNTER — Encounter: Payer: Self-pay | Admitting: Sports Medicine

## 2017-10-26 ENCOUNTER — Telehealth: Payer: Self-pay | Admitting: Orthopedic Surgery

## 2017-10-26 NOTE — Telephone Encounter (Signed)
Spoke with patient regarding the cost of the injection.  His insurance does not cover this medication, he is going to see what they will do for him and call me back.    Patient is aware the cost is $1495.61.

## 2017-10-28 NOTE — Telephone Encounter (Signed)
Patients injection is covered, called patient to let him know.

## 2017-11-08 ENCOUNTER — Encounter: Payer: Self-pay | Admitting: Emergency Medicine

## 2017-11-08 ENCOUNTER — Ambulatory Visit
Admission: AD | Admit: 2017-11-08 | Discharge: 2017-11-08 | Disposition: A | Discharge status: Home / Self Care | Payer: BLUE CROSS/BLUE SHIELD | Source: Ambulatory Visit | Attending: Emergency Medicine | Admitting: Emergency Medicine

## 2017-11-08 DIAGNOSIS — T63461A Toxic effect of venom of wasps, accidental (unintentional), initial encounter: Secondary | ICD-10-CM

## 2017-11-08 DIAGNOSIS — R220 Localized swelling, mass and lump, head: Secondary | ICD-10-CM

## 2017-11-08 DIAGNOSIS — L299 Pruritus, unspecified: Secondary | ICD-10-CM

## 2017-11-08 MED ORDER — FAMOTIDINE 20 MG PO TABS *I*
40.0000 mg | ORAL_TABLET | Freq: Once | ORAL | Status: AC
Start: 2017-11-08 — End: 2017-11-08
  Administered 2017-11-08: 40 mg via ORAL

## 2017-11-08 MED ORDER — EPINEPHRINE 0.3 MG/0.3ML IJ SOAJ *I*
0.3000 mg | INTRAMUSCULAR | 2 refills | Status: DC | PRN
Start: 2017-11-08 — End: 2023-12-23

## 2017-11-08 MED ORDER — METHYLPREDNISOLONE SOD SUCC 125 MG IJ SOLR(62.5MG/ML) *WRAPPED*
125.0000 mg | Freq: Four times a day (QID) | INTRAMUSCULAR | Status: DC
Start: 2017-11-08 — End: 2017-11-08
  Administered 2017-11-08: 125 mg via INTRAMUSCULAR

## 2017-11-08 MED ORDER — DIPHENHYDRAMINE HCL 50 MG/ML IJ SOLN *I*
50.0000 mg | Freq: Four times a day (QID) | INTRAMUSCULAR | Status: DC | PRN
Start: 2017-11-08 — End: 2017-11-08
  Administered 2017-11-08: 50 mg via INTRAMUSCULAR

## 2017-11-08 MED ORDER — PREDNISONE 50 MG PO TABS *I*
50.0000 mg | ORAL_TABLET | Freq: Every day | ORAL | 0 refills | Status: DC
Start: 2017-11-08 — End: 2018-12-04

## 2017-11-08 NOTE — [facility] Provider Note (Signed)
History     Chief Complaint   Patient presents with    Insect Bite     stung by wasp to l side of face. Now having tingling in lower extremeties, swelling at site and on upper lip. Airway intact     Patient states he was stung on the left upper cheek roughly 30 minutes prior to arrival.  No history of anaphylaxis to wasp stings.  States he feels tingling in his extremities and he has some swelling of his upper lip.  Denies trouble breathing or cough.  No abdominal pain nausea or vomiting.  No chest pain.  He did not take any medication prior to arrival.        History provided by:  Patient  Language interpreter used: No        Medical/Surgical/Family History     Past Medical History:   Diagnosis Date    Arthritis     elbows and shoulders     Chest pain, unspecified     Depression 05/23/2014    Neuromuscular disorder     elbows, shoulders    Unspecified essential hypertension 06/02/2013        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    Bilateral carpal tunnel syndrome G56.03    Ulnar neuropathy of right upper extremity G56.21            Past Surgical History:   Procedure Laterality Date    HAND SURGERY      trigger finger release     PR ARTHRS KNE SURG W/MENISCECTOMY MED/LAT W/SHVG Right 07/16/2016    Procedure: KNEE ARTHROSCOPY WITH PARTIAL MEDIAL MENISCECTOMY;  Surgeon: Jannifer Franklin, MD;  Location: [facility];  Service: Orthopedics     Family History   Problem Relation Age of Onset    Colon polyps Mother     Cancer Mother     Colon cancer Neg Hx           Social History   Substance Use Topics    Smoking status: Never Smoker    Smokeless tobacco: Never Used    Alcohol use No     Living Situation     Questions Responses    Patient  lives with     Homeless     Caregiver for other family member     External Services     Employment     Domestic Violence Risk                 Review of Systems   Review of Systems   Constitutional: Negative.    HENT: Negative.    Eyes: Negative.    Respiratory: Negative for cough and chest tightness.    Cardiovascular: Negative for chest pain and palpitations.   Gastrointestinal: Negative for abdominal pain.   Endocrine: Negative.    Genitourinary: Negative.    Musculoskeletal: Negative.    Skin: Negative.  Negative for rash.   Allergic/Immunologic: Negative for environmental allergies and immunocompromised state.   Neurological: Negative.    Hematological: Negative.    Psychiatric/Behavioral: Negative.    All other systems reviewed and are negative.      Physical  Exam   Triage Vitals  Triage Start: Start, (11/08/17 1417)   First Recorded BP: 154/73, Resp: 18, Temp: 36.4 C (97.5 F), Temp src: TEMPORAL Oxygen Therapy SpO2: 98 %, O2 Device: None (Room air), Heart Rate: 92, (11/08/17 1417)  .      Physical Exam   Constitutional:   Anxious appearing   HENT:   Mouth/Throat: Oropharynx is clear and moist.   Mild edema noted to the upper lip.  No periorbital edema.  No oropharyngeal edema or swelling.   Eyes: Pupils are equal, round, and reactive to light. Conjunctivae are normal.   Cardiovascular: Normal rate and regular rhythm.    Pulmonary/Chest: Breath sounds normal. He has no wheezes.   Mild increase in respiratory rate to the low 20s.   Abdominal: Soft.   Musculoskeletal: Normal range of motion. He exhibits no edema or tenderness.   Skin: Skin is warm. He is not diaphoretic.   Left upper cheek with mild erythema and edema where wasp envenomation happened.   Psychiatric: He has a normal mood and affect.   Nursing note and vitals reviewed.       Medical Decision Making        Initial Evaluation:  ED First Provider Contact     Date/Time Event User Comments    11/08/17 1421 ED First Provider Contact Orlinda Blalock  H Initial Face to Face Provider Contact          Patient was seen on: 11/08/2017        Assessment:  57 y.o.male comes to the [facility] with  wasp sting to left upper cheek with symptoms of bilateral extremity tingling and itching as well as mild edema of the upper lip.  No chest pains, no shortness of breath no swelling of the oropharyngeal region.    Differential Diagnosis includes:  Anaphylaxis  Insect sting reaction      Plan: No evidence for acute anaphylaxis.  We'll medicate with antihistamines as well as Solu-Medrol for prevention of rebound reaction later on.  We'll observe for at least 30 minutes.  If at any time he is complaining of throat swelling or trouble breathing he will receive epinephrine 0.3 mg IM, and transferred to the hospital.    40 minutes status post treatment patient reevaluated.  Patient feels like he is doing much better.  No more tingling or feeling of swelling of his feet.  Swelling in the upper lip is essentially gone.  Feels sleepy due to Benadryl.  Risk of anaphylaxis very low    Sent home with 3 days of prednisone for prevention of delayed anaphylaxis.  Use Benadryl every 4 hours until feeling back to normal.  Discussed with him the risks and benefits of epinephrine auto injector to have on hand.  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  I agreed to provide him with a supply of EpiPen autoinjector to be used only in case of a reaction to an insect sting not if he just gets stung with no reaction.  Patient understands.      Orders Placed This Encounter    diphenhydrAMINE (BENADRYL) 50 mg/mL injection 50 mg    famotidine (PEPCID) tablet 40 mg    methylPREDNISolone sodium succinate (Solu-MEDROL) 62.5 mg/mL injection 125 mg    predniSONE (DELTASONE) 50 MG tablet    EPINEPHrine 0.3 mg/0.3 mL auto-injector       No results found for this or any previous visit (from  the past 24 hour(s)).        Final Diagnosis    ICD-10-CM ICD-9-CM    1. Wasp sting, accidental or unintentional, initial encounter T63.461A 989.5     E905.3       Encourage fluids, encourage rest, good hand hygiene.    Use over the counter medications as discussed.    Please start the new medications as below:    Current Discharge Medication List      New Medications    Details Last Dose Given Next Dose Due Script Given?   predniSONE (DELTASONE) 50 mg Dose: 50 mg  Take 50 mg by mouth daily    Quantity 3 tablet, Refill 0  Start date: 11/08/2017, End date: 11/11/2017       Comments: Emergency Encounter            EPINEPHrine (EPIPEN,AUVI-Q) 0.3 mg Dose: 0.3 mg  Inject 0.3 mg into the muscle as needed for Anaphylaxis    Quantity 2 Device, Refill 2  Start date: 11/08/2017       Comments: Please dispense whichever epinehprine product (EpiPen, Auvi-Q, or generic) is lowest cost to the patient                   Please follow up with your physician as below:    Follow-up Information     Penni Bombard, MD.    Specialties:  Primary Care, Internal Medicine  Why:  If symptoms worsen  Contact information:  [address]  [address]  [place] [address]  [phone]                 Thank you [name] for coming to [facility] for your health care concerns.    If your condition changes and/or worsens please follow up with her primary doctor and/or return to the urgent care center.    If short of breath, chest pains or any other concerns please report to the emergency room.    In the event of an Emergency dial 911.         Final Diagnosis  Final diagnoses:   [T63.461A] Wasp sting, accidental or unintentional, initial encounter (Primary)         Towana Badger, MD              Towana Badger, MD  11/08/17 [phone]

## 2017-11-08 NOTE — Discharge Instructions (Signed)
Take 50 mg of Benadryl every 4 hours until you have no itching or swelling or rash.    Take next dose of prednisone today and complete to more days.    As we discussed, I have provided with epinephrine autoinjector's.  Please use only as we discussed.

## 2017-11-10 ENCOUNTER — Other Ambulatory Visit
Admission: RE | Admit: 2017-11-10 | Discharge: 2017-11-10 | Disposition: A | Discharge status: Home / Self Care | Payer: BLUE CROSS/BLUE SHIELD | Source: Ambulatory Visit | Attending: Psychiatry | Admitting: Psychiatry

## 2017-11-10 DIAGNOSIS — F1020 Alcohol dependence, uncomplicated: Secondary | ICD-10-CM | POA: Insufficient documentation

## 2017-11-30 ENCOUNTER — Ambulatory Visit: Payer: BLUE CROSS/BLUE SHIELD | Attending: Orthopedic Surgery | Admitting: Orthopedic Surgery

## 2017-11-30 ENCOUNTER — Other Ambulatory Visit
Admission: RE | Admit: 2017-11-30 | Discharge: 2017-11-30 | Disposition: A | Discharge status: Home / Self Care | Payer: BLUE CROSS/BLUE SHIELD | Source: Ambulatory Visit | Attending: Psychiatry | Admitting: Psychiatry

## 2017-11-30 ENCOUNTER — Encounter: Payer: Self-pay | Admitting: Orthopedic Surgery

## 2017-11-30 VITALS — BP 118/65 | HR 54 | Ht 70.0 in | Wt 165.0 lb

## 2017-11-30 DIAGNOSIS — F1020 Alcohol dependence, uncomplicated: Secondary | ICD-10-CM | POA: Insufficient documentation

## 2017-11-30 DIAGNOSIS — M1711 Unilateral primary osteoarthritis, right knee: Secondary | ICD-10-CM

## 2017-11-30 MED ORDER — HYLAN 48 MG/6ML IX SOSY *I*
48.0000 mg | PREFILLED_SYRINGE | Freq: Once | INTRA_ARTICULAR | Status: AC | PRN
Start: 2017-11-30 — End: 2017-11-30
  Administered 2017-11-30: 48 mg via INTRA_ARTICULAR

## 2017-11-30 NOTE — Progress Notes (Signed)
CHIEF COMPLAINT: Synvisc 1 injection right knee    INTERVAL HISTORY: Patient presents today for a Synvisc 1 injection.  He continues to describe achy, weightbearing pain from his primary right knee arthritis.    PFSH:  Since last visit no change.    ROS:  Since last visit no change.    MEDICATION:  Since last visit no new meds.    ALLERGIES:  As per initial evaluation.    PHYSICAL EXAM: His right knee today does not show any increased warmth or erythema.  He has no effusion today.  He can extend his right knee actively to 0.  Flexion is to 120.  Slight tenderness medially at the joint line.  No ligamentous instability.    IMAGING:    ASSESSMENT: Symptomatic primary right knee arthritis    PLAN: Right knee Synvisc 1 injection was offered and provided.  Please refer to procedure note for details.  Follow-up in 4-6 months, sooner as needed.    He is aware of the upcoming retirement of Dr. Noland Fordyce.    ORDERS TODAY:    ORDERS NEXT VISIT:    PERCENT OF TEMPORARY IMPAIRMENT:

## 2017-11-30 NOTE — Procedures (Signed)
Large Joint Aspiration/Injection Procedure    Date/Time: 11/30/2017 3:21 PM  Consent given by: patient  Site marked: site marked  Timeout: Immediately prior to procedure a time out was called to verify the correct patient, procedure, equipment, support staff and site/side marked as required     Procedure Details    Location: knee - R knee intra - articular  Preparation: The site was prepped using the usual aseptic technique.  Viscosupplementation & other medications administered: 48 mg hylan 48 MG/6ML  Dressing:  A dry, sterile dressing was applied.  Patient tolerance: patient tolerated the procedure well with no immediate complications

## 2017-12-02 ENCOUNTER — Encounter: Payer: Self-pay | Admitting: Gastroenterology

## 2017-12-11 ENCOUNTER — Ambulatory Visit
Admission: AD | Admit: 2017-12-11 | Discharge: 2017-12-11 | Disposition: A | Discharge status: Home / Self Care | Payer: BLUE CROSS/BLUE SHIELD | Source: Ambulatory Visit | Attending: Emergency Medicine | Admitting: Emergency Medicine

## 2017-12-11 DIAGNOSIS — T63441A Toxic effect of venom of bees, accidental (unintentional), initial encounter: Secondary | ICD-10-CM

## 2017-12-11 DIAGNOSIS — L03115 Cellulitis of right lower limb: Secondary | ICD-10-CM

## 2017-12-11 MED ORDER — DOXYCYCLINE HYCLATE 100 MG PO CAPS *I*
100.0000 mg | ORAL_CAPSULE | Freq: Two times a day (BID) | ORAL | 0 refills | Status: AC
Start: 2017-12-11 — End: 2017-12-18

## 2017-12-11 MED ORDER — PREDNISONE 20 MG PO TABS *I*
ORAL_TABLET | ORAL | 0 refills | Status: DC
Start: 2017-12-11 — End: 2018-02-24

## 2017-12-11 NOTE — UC Provider Note (Signed)
History     Chief Complaint   Patient presents with    Insect Bite     c/o bee sting to right knee at 1830 last night, took benadyl last night, c/o leg itching, swelling, redness      57 year old male with no reported past medical history other than hypertension presents complaining of bee sting to right knee sustained around 6:30 last night.  Patient states took Benadryl and monitored swelling.  Patient has a history of hives and anaphylaxis to [place] on him and had EpiPen with him.  Patient states did not have to take EpiPen however this morning woke to increase leg swelling, itching and redness.  Denies any fever, chills, difficulty swallowing, shortness of breath, or wheezing.            Medical/Surgical/Family History     Past Medical History:   Diagnosis Date    Arthritis     elbows and shoulders     Chest pain, unspecified     Depression 05/23/2014    Neuromuscular disorder     elbows, shoulders    Unspecified essential hypertension 06/02/2013        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    Bilateral carpal tunnel syndrome G56.03    Ulnar neuropathy of right upper extremity G56.21            Past Surgical History:   Procedure Laterality Date    HAND SURGERY      trigger finger release     PR ARTHRS KNE SURG W/MENISCECTOMY MED/LAT W/SHVG Right 07/16/2016    Procedure: KNEE ARTHROSCOPY WITH PARTIAL MEDIAL MENISCECTOMY;  Surgeon: Jannifer Franklin, MD;  Location: [facility];  Service: Orthopedics     Family History   Problem Relation Age of Onset    Colon polyps Mother     Cancer Mother     Colon cancer Neg Hx           Social History   Substance Use Topics    Smoking status: Never Smoker    Smokeless tobacco: Never Used    Alcohol  use No     Living Situation     Questions Responses    Patient lives with     Homeless     Caregiver for other family member     External Services     Employment     Domestic Violence Risk                 Review of Systems   Review of Systems   Constitutional: Positive for activity change. Negative for diaphoresis.   Respiratory: Negative for shortness of breath.    Cardiovascular: Negative for chest pain.   Musculoskeletal: Positive for joint swelling. Negative for back pain, gait problem and neck pain.   Skin: Positive for color change and wound.   Neurological: Negative for dizziness, tremors, weakness, light-headedness and numbness.       Physical Exam   Triage Vitals  Triage Start: Start, (12/11/17 1625)   First Recorded BP: 117/74, Temp: 36.5 C (97.7 F), Temp src: TEMPORAL Oxygen Therapy SpO2: 97 %, Oximetry Source: Rt Hand, O2 Device: None (  Room air), Heart Rate: 70, (12/11/17 1626)  .  First Pain Reported  0-10 Scale: 4, Pain Location/Orientation: Knee Right, (12/11/17 1627)       Physical Exam   Constitutional: He is oriented to person, place, and time. He appears well-developed and well-nourished. No distress.   HENT:   Head: Normocephalic and atraumatic.   Nose: Nose normal.   Eyes: Pupils are equal, round, and reactive to light.   Neck: Normal range of motion. Neck supple.   Cardiovascular: Normal rate, regular rhythm and intact distal pulses.    Pulmonary/Chest: Effort normal.   Musculoskeletal: He exhibits edema and tenderness. He exhibits no deformity.   Neurological: He is alert and oriented to person, place, and time. He exhibits normal muscle tone.   Skin: Skin is warm and dry. He is not diaphoretic. There is erythema.   Psychiatric: He has a normal mood and affect. His behavior is normal. Judgment and thought content normal.   Nursing note and vitals reviewed.       Medical Decision Making      Amount and/or Complexity of Data Reviewed  Tests in the medicine section of CPT: reviewed and  ordered        Initial Evaluation:  ED First Provider Contact     Date/Time Event User Comments    12/11/17 1700 ED First Provider Contact [facility] Initial Face to Face Provider Contact          Patient was seen on: 12/11/2017        Assessment:  57 y.o.male comes to the [facility] [place] with redness, swelling, and itching to right knee after getting stung by a bee. States took benadryl and went to sleep. When he woke noticed worsening swelling and redness along with mild discomfort.     Differential Diagnosis includes:  Cellulitis  Gout      Plan:   Orders Placed This Encounter    predniSONE (DELTASONE) 20 MG tablet    doxycycline hyclate (VIBRAMYCIN) 100 MG capsule       Use over the counter medications as discussed.    Please start the new medications as below:    Current Discharge Medication List      New Medications    Details Last Dose Given Next Dose Due Script Given?   predniSONE (DELTASONE) 20 MG tablet Take 40 mg by mouth once a day for the first 2 days, then 20 mg next 2 days, then 10 mg for the last day.  Quantity 7 tablet, Refill 0  Start date: 12/11/2017            doxycycline hyclate (VIBRAMYCIN) 100 mg Dose: 100 mg  Take 100 mg by mouth 2 times daily  Quantity 14 capsule, Refill 0  Start date: 12/11/2017, End date: 12/18/2017       Comments: Emergency Encounter                   I have reviewed all labs and discharge instructions with the patient. I have answered all questions to the best of my knowledge. Patient/caregiver verbalizes understanding and is agreeable to discharge.    Patient advised if symptoms persist or worsen to seek urgent evaluation in ER.     [employer] and education officer was you for part/all of this encounter. Errors in grammar were changed and fixed to the best of my ability.           Final Diagnosis  Final diagnoses:   [A63.016W] Bee sting,  accidental or unintentional, initial encounter (Primary)   [L03.115] Cellulitis of right lower extremity         Robyne Askew,  NP              Robyne Askew, NP  12/12/17 [phone]

## 2017-12-11 NOTE — ED Triage Notes (Signed)
c/o bee sting to right knee at 1830 last night, took benadyl last night, c/o leg itching, swelling, redness        Triage Note   Legrand Rams, RN

## 2017-12-11 NOTE — Discharge Instructions (Signed)
Keep bites clean and dry      Watch for signs of worsening infection including spreading redness, swelling, drainage from wound      Avoid scratching      Benadryl spray as needed for additional itch control      Cool compresses may help as well, avoid heat      Take full course of both antibiotics as directed.

## 2017-12-22 ENCOUNTER — Other Ambulatory Visit
Admission: RE | Admit: 2017-12-22 | Discharge: 2017-12-22 | Disposition: A | Discharge status: Home / Self Care | Payer: BLUE CROSS/BLUE SHIELD | Source: Ambulatory Visit | Attending: Psychiatry | Admitting: Psychiatry

## 2017-12-22 DIAGNOSIS — F1020 Alcohol dependence, uncomplicated: Secondary | ICD-10-CM | POA: Insufficient documentation

## 2018-01-06 ENCOUNTER — Other Ambulatory Visit: Payer: Self-pay | Admitting: Primary Care

## 2018-01-06 NOTE — Telephone Encounter (Signed)
Last visit 09/24/17  Next visit 03/26/18  Labs 09/07/17

## 2018-01-10 ENCOUNTER — Other Ambulatory Visit: Payer: Self-pay | Admitting: Primary Care

## 2018-01-18 ENCOUNTER — Other Ambulatory Visit: Payer: Self-pay | Admitting: Primary Care

## 2018-01-18 NOTE — Telephone Encounter (Signed)
LOV  09/24/17  FUV  03/26/18

## 2018-02-06 ENCOUNTER — Other Ambulatory Visit: Payer: Self-pay | Admitting: Primary Care

## 2018-02-24 ENCOUNTER — Encounter: Payer: Self-pay | Admitting: Primary Care

## 2018-02-24 ENCOUNTER — Telehealth: Payer: Self-pay | Admitting: Primary Care

## 2018-02-24 ENCOUNTER — Ambulatory Visit: Payer: BLUE CROSS/BLUE SHIELD | Attending: Primary Care | Admitting: Primary Care

## 2018-02-24 VITALS — BP 118/76 | HR 58 | Temp 97.3°F | Ht 70.0 in | Wt 181.4 lb

## 2018-02-24 DIAGNOSIS — R109 Unspecified abdominal pain: Secondary | ICD-10-CM | POA: Insufficient documentation

## 2018-02-24 DIAGNOSIS — R1084 Generalized abdominal pain: Secondary | ICD-10-CM | POA: Insufficient documentation

## 2018-02-24 LAB — POCT URINALYSIS DIPSTICK
Bilirubin,Ur: NEGATIVE
Blood,UA POCT: NEGATIVE
Glucose,UA POCT: NORMAL mg/dL
Ketones,UA POCT: NEGATIVE mg/dL
Leuk Esterase,UA POCT: NEGATIVE
Lot #: 36253602
Nitrite,UA POCT: NEGATIVE
PH,UA POCT: 6 (ref 5–8)
Protein,UA POCT: NEGATIVE mg/dL
Specific gravity,UA POCT: 1.015 (ref 1.002–1.030)
Urobilinogen,UA: NORMAL mg/dL

## 2018-02-24 LAB — URINALYSIS WITH REFLEX TO MICROSCOPIC
Blood,UA: NEGATIVE
Ketones, UA: NEGATIVE
Leuk Esterase,UA: NEGATIVE
Nitrite,UA: NEGATIVE
Protein,UA: NEGATIVE mg/dL
Specific Gravity,UA: 1.016 (ref 1.002–1.030)
pH,UA: 6 (ref 5.0–8.0)

## 2018-02-24 NOTE — Telephone Encounter (Signed)
Left message for pt to call the office, please advise him no PA required and he can call [facility] @ [phone].    Per pt's insurance no PA is required ref # [id number]

## 2018-02-25 ENCOUNTER — Other Ambulatory Visit: Payer: Self-pay | Admitting: Gastroenterology

## 2018-02-25 ENCOUNTER — Telehealth: Payer: Self-pay | Admitting: Primary Care

## 2018-02-25 ENCOUNTER — Encounter: Payer: Self-pay | Admitting: Primary Care

## 2018-02-25 NOTE — Progress Notes (Signed)
Lance Peters is a 57 y.o. male   02/24/2018    No chief complaint on file.      HPI:  Lance Peters arrived for an acute appointment.    Here for evaluation of pain in the right lower back/flank/abdomen.  It has been present since the morning of the 17th.  Describes it as sometimes dull, sometimes sharper.  Pain sometimes radiates down towards the right lower quadrant abdomen.  He had some chills and diarrhea on the 18th, but none since then.  His bowel movements have, since, been normal.  He says that his urine has seemed foamy to him, more bubbles unusual.  Hasn't noticed any blood in the urine.      Does not have a history of kidney stones.  He has not had appendectomy.    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    Bilateral carpal tunnel syndrome G56.03    Ulnar neuropathy of right upper extremity G56.21       Allergies: Wasp venom and Penicillins    Medication list reviewed, changes made  Current Outpatient Medications   Medication Sig    sertraline (ZOLOFT) 50 MG tablet TAKE 1 TABLET BY MOUTH EVERY DAY    benazepril (LOTENSIN) 10 MG tablet TAKE 1 TABLET BY MOUTH EVERY DAY    amLODIPine (NORVASC) 10 MG tablet TAKE 1 TABLET BY MOUTH EVERY DAY    piroxicam (FELDENE) 20 MG capsule TAKE 1 CAPSULE BY MOUTH EVERY DAY    EPINEPHrine 0.3 mg/0.3 mL auto-injector Inject 0.3 mLs (0.3 mg total) into the muscle as needed for Anaphylaxis    atorvastatin (LIPITOR) 20 MG tablet TAKE 1 TABLET BY MOUTH EVERY DAY WITH DINNER    acetaminophen (TYLENOL) 325 MG tablet Take by mouth every 6 hours as needed for Pain    aspirin 81 MG tablet Take 81 mg by mouth daily     No current facility-administered medications for this visit.         Allergy list  reviewed  Problem list reviewed    OBJECTIVE:  Vitals:    02/24/18 1550   BP: 118/76   Pulse: 58   Temp: 36.3 C (97.3 F)   Weight: 82.3 kg (181 lb 6.4 oz)   Height: 1.778 m (5\' 10" )       GENERAL:  Alert, no distress  HEENT: No ocular discharge.     NECK:  Trachea midline, thyroid gland normal, without masses; no significant adenopathy  LUNGS:  Normal respiratory effort, Clear to auscultation bilaterally, without wheezes or crackles  CV: Regular rate and rhythm, S1 and S2 are normal, without murmurs rubs or gallops.    Abdomen: Nondistended.  Skin is warm dry and intact without rash.  No real CVA tenderness.  He reports some mild discomfort with palpation of the right kidney.  There is also mild tenderness at McBurney's point, but no rigidity, rebound or guarding.    Recent Results (from the past 336 hour(s))   POCT urinalysis dipstick    Collection Time: 02/24/18  4:00 PM   Result Value Ref Range    Specific gravity,UA POCT 1.015 1.002 - 1.030    PH,UA  POCT 6.0 5 - 8    Leuk Esterase,UA POCT Negative Negative    Nitrite,UA POCT Negative Negative    Protein,UA POCT Negative Negative mg/dL    Glucose,UA POCT Normal Normal mg/dL    Ketones,UA POCT Negative Negative mg/dL    Urobilinogen,UA Normal Less than 1 mg/dL    Bilirubin,Ur Negative Negative    Blood,UA POCT Negative Negative    Exp date 04.30.20     Lot # [id number]           ASSESSMENT/PLAN:  Right-sided back and flank pain present for several days, of uncertain etiology.  Differential diagnosis includes ureteral stone, acute appendicitis, other colitis, or musculoskeletal origin.  I've asked him to go ahead with a CT scan of the abdomen and pelvis to further delineate these options.  Further treatment will be based on results.  We'll also send his urine to the lab for a formal urinalysis to ensure that we haven't missed anything on our on-site testing.  We will contact him with results once they're available.  He was instructed to call for any worsening.

## 2018-02-25 NOTE — Telephone Encounter (Signed)
I called [facility] at [phone] spoke to [place].   To get the pelvis approve a peer to peer is needed call [phone].    Case ref is patients ID [id number] and date of birth 12-30-1960.     [employer] will need to be called back with approval.

## 2018-02-25 NOTE — Telephone Encounter (Signed)
Tanish from [place] calling, patient is scheduled at 2:00 pm for exam.  [facility] needs tobe called for prior auth or they have to cancel.

## 2018-02-25 NOTE — Telephone Encounter (Signed)
Let patient know the number for [facility] and the reference number regarding the authorization not being needed.  He will call today

## 2018-02-25 NOTE — Telephone Encounter (Signed)
Peer to peer done.    Approval #  [id number], good 02/25/18-03/26/18    Contacted [employer] with approval number. Left message for Yvetta Coder to contact the office on the back line.

## 2018-02-25 NOTE — Telephone Encounter (Signed)
Sharyn Lull calling to get status of prior auth.   She will let Yvetta Coder know she called.    Gave note from [place]:  Note:  Peer to peer done.    Approval #  [id number], good 02/25/18-03/26/18    Contacted [employer] with approval number. Left message for Yvetta Coder to contact the office on the back line.

## 2018-02-25 NOTE — Telephone Encounter (Signed)
Patient called, he received the my chart message.  He tried to schedule [facility] at [facility], they can't get him til December, I gave him [employer] and Sharol Harness number to call and faxed the req.  He will let us know if he cannot get in today or tomorrow.

## 2018-02-26 ENCOUNTER — Telehealth: Payer: Self-pay | Admitting: Primary Care

## 2018-02-26 ENCOUNTER — Encounter: Payer: Self-pay | Admitting: Primary Care

## 2018-02-26 NOTE — Telephone Encounter (Addendum)
Sent mychart msg :  The CT scan of his abdomen was pretty much normal.  No kidney stone, no appendicitis.  Nothing that really explains the pain.  I expect his pain is most likely due to muscle or pinched nerve.  I did give it more time, see if it doesn't get better.    There is a very small fatty density in the middle of the right kidney, which is probably a benign growth.  A lot of people have these, they don't typically cause problems, but we should probably do an ultrasound in a year, just to make sure it's not growing.    He should let me know if symptoms don't improve, or if anything gets worse.

## 2018-03-01 MED ORDER — CYCLOBENZAPRINE HCL 10 MG PO TABS *I*
10.0000 mg | ORAL_TABLET | Freq: Two times a day (BID) | ORAL | 2 refills | Status: DC | PRN
Start: 2018-03-01 — End: 2019-09-13

## 2018-03-19 ENCOUNTER — Ambulatory Visit: Payer: BLUE CROSS/BLUE SHIELD | Attending: Orthopedic Surgery | Admitting: Orthopedic Surgery

## 2018-03-19 ENCOUNTER — Encounter: Payer: Self-pay | Admitting: Orthopedic Surgery

## 2018-03-19 DIAGNOSIS — M170 Bilateral primary osteoarthritis of knee: Secondary | ICD-10-CM

## 2018-03-19 MED ORDER — LIDOCAINE HCL 1 % IJ SOLN *I*
5.0000 mL | Freq: Once | INTRAMUSCULAR | Status: AC | PRN
Start: 2018-03-19 — End: 2018-03-19
  Administered 2018-03-19: 5 mL via INTRA_ARTICULAR

## 2018-03-19 MED ORDER — BETAMETHASONE ACET & SOD PHOS 6 (3-3) MG/ML IJ SUSP *I*
12.0000 mg | Freq: Once | INTRAMUSCULAR | Status: AC | PRN
Start: 2018-03-19 — End: 2018-03-19
  Administered 2018-03-19: 09:00:00 12 mg via INTRA_ARTICULAR

## 2018-03-19 MED ORDER — ETHYL CHLORIDE EX AERO (MULTIPLE PATIENTS) *I*
1.0000 | INHALATION_SPRAY | Freq: Once | CUTANEOUS | Status: AC | PRN
Start: 2018-03-19 — End: 2018-03-19
  Administered 2018-03-19: 1 via TOPICAL

## 2018-03-19 MED ORDER — LIDOCAINE HCL 1 % IJ SOLN *I*
5.0000 mL | Freq: Once | INTRAMUSCULAR | Status: AC | PRN
Start: 2018-03-19 — End: 2018-03-19
  Administered 2018-03-19: 09:00:00 5 mL via INTRA_ARTICULAR

## 2018-03-19 NOTE — Procedures (Signed)
Large Joint Aspiration/Injection Procedure: L knee intra - articular    Date/Time: 03/19/2018  9:00 AM EST  Consent given by: patient  Site marked: site marked  Timeout: Immediately prior to procedure a time out was called to verify the correct patient, procedure, equipment, support staff and site/side marked as required     Procedure Details    Location: knee - L knee intra - articular  Preparation: The site was prepped using the usual aseptic technique.  Anesthetics administered: 5 mL lidocaine hcl 1 %  Intra-Articular Steroids administered: 12 mg betamethasone acetate & sodium phosphate 6 (3-3) MG/ML  Dressing:  A dry, sterile dressing was applied.  Patient tolerance: patient tolerated the procedure well with no immediate complications    Large Joint Aspiration/Injection Procedure: R knee intra - articular    Date/Time: 03/19/2018  9:00 AM EST  Consent given by: patient  Site marked: site marked  Timeout: Immediately prior to procedure a time out was called to verify the correct patient, procedure, equipment, support staff and site/side marked as required     Procedure Details    Location: knee - R knee intra - articular  Preparation: The site was prepped using the usual aseptic technique.  Anesthetics administered: 1 spray ethyl chloride; 5 mL lidocaine hcl 1 %  Intra-Articular Steroids administered: 12 mg betamethasone acetate & sodium phosphate 6 (3-3) MG/ML  Dressing:  A dry, sterile dressing was applied.  Patient tolerance: patient tolerated the procedure well with no immediate complications

## 2018-03-19 NOTE — Progress Notes (Signed)
CHIEF COMPLAINT: Bilateral knee pain    INTERVAL HISTORY: Patient presents today for follow-up of his bilateral knee pain.  He is roughly 4 months following a Synvisc 1 injection for his primary right knee arthritis.    He does have bilateral knee arthritis.  He has noted a recurrence of his achy, weightbearing pain in both knees.  No new trauma or injury.  No sharp, mechanical pain.    PFSH:  Since last visit no change.    ROS:  Since last visit no change.    MEDICATION:  Since last visit no new meds.    ALLERGIES:  As per initial evaluation.    PHYSICAL EXAM: His knees today do not show any increased warmth or erythema.  He has no effusion of either knee.  He can extend both knees actively to 0.  Flexion is to 120.  No patellofemoral crepitus.  Good motion of both hips without reproduction of his usual discomfort.  He does have some nonspecific tenderness across the medial and lateral joint lines.    IMAGING:    ASSESSMENT: Recurrent symptoms of bilateral knee arthritis    PLAN: Bilateral knee cortisone injections were offered and provided.  Please refer to procedure note for details.  Follow-up next for preapproval for Visco supplementation.  He was provided a list of partners in the nonoperative sports division to continue with his conservative arthritic treatment.    ORDERS TODAY:    ORDERS NEXT VISIT:    PERCENT OF TEMPORARY IMPAIRMENT:

## 2018-03-22 ENCOUNTER — Other Ambulatory Visit
Admission: RE | Admit: 2018-03-22 | Discharge: 2018-03-22 | Disposition: A | Discharge status: Home / Self Care | Payer: BLUE CROSS/BLUE SHIELD | Source: Ambulatory Visit | Attending: Primary Care | Admitting: Primary Care

## 2018-03-22 ENCOUNTER — Ambulatory Visit: Payer: BLUE CROSS/BLUE SHIELD | Admitting: Sports Medicine

## 2018-03-22 DIAGNOSIS — I10 Essential (primary) hypertension: Secondary | ICD-10-CM | POA: Insufficient documentation

## 2018-03-22 LAB — LIPID PANEL
Chol/HDL Ratio: 3
Cholesterol: 137 mg/dL
HDL: 46 mg/dL (ref 40–60)
LDL Calculated: 66 mg/dL
Non HDL Cholesterol: 91 mg/dL
Triglycerides: 125 mg/dL

## 2018-03-22 LAB — HEMOGLOBIN A1C: Hemoglobin A1C: 5.9 % — ABNORMAL HIGH

## 2018-03-22 LAB — BASIC METABOLIC PANEL
Anion Gap: 8 (ref 7–16)
CO2: 28 mmol/L (ref 20–28)
Calcium: 9.3 mg/dL (ref 8.6–10.2)
Chloride: 103 mmol/L (ref 96–108)
Creatinine: 1.04 mg/dL (ref 0.67–1.17)
GFR,Black: 90 *
GFR,Caucasian: 78 *
Glucose: 96 mg/dL (ref 60–99)
Lab: 21 mg/dL — ABNORMAL HIGH (ref 6–20)
Potassium: 4.1 mmol/L (ref 3.3–5.1)
Sodium: 139 mmol/L (ref 133–145)

## 2018-03-26 ENCOUNTER — Encounter: Payer: Self-pay | Admitting: Primary Care

## 2018-03-26 ENCOUNTER — Ambulatory Visit: Payer: BLUE CROSS/BLUE SHIELD | Attending: Primary Care | Admitting: Primary Care

## 2018-03-26 VITALS — BP 112/70 | HR 54 | Ht 70.0 in | Wt 173.0 lb

## 2018-03-26 DIAGNOSIS — I10 Essential (primary) hypertension: Secondary | ICD-10-CM

## 2018-03-26 DIAGNOSIS — D3001 Benign neoplasm of right kidney: Secondary | ICD-10-CM

## 2018-03-26 DIAGNOSIS — R7301 Impaired fasting glucose: Secondary | ICD-10-CM

## 2018-03-26 DIAGNOSIS — D1771 Benign lipomatous neoplasm of kidney: Secondary | ICD-10-CM

## 2018-03-26 MED ORDER — PREDNISONE 20 MG PO TABS *I*
40.0000 mg | ORAL_TABLET | Freq: Every day | ORAL | 0 refills | Status: DC | PRN
Start: 2018-03-26 — End: 2019-11-02

## 2018-03-26 NOTE — Progress Notes (Addendum)
Lance Peters is a 57 y.o. male   03/26/2018    No chief complaint on file.      HPI:  Lance Peters arrived for a scheduled follow up appointment.    Following the blood pressure and impaired fasting glucose.    Generally feeling pretty well.  Reviewed recent labs, everything is on target, goes has improved.    Blood pressure is on target.    Reviewed medications, he is taking everything as listed, not missing any doses, no side effects.    I had seen him recently for back pain.  We did CT of the abdomen because of concerns about possible intra-abdominal problems, but the CT scan was essentially normal.  There is a small possible angiomyolipoma in the right kidney that we will plan to look at again in about a year.  He has been using cyclobenzaprine at bedtime to help with the back pain, which seems to be working okay.    Discussed his severe bee sting allergy history.  He is given epi-pens in urgent care, wonders if I will prescribe for him when those expire, or if he uses them.    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    Bilateral carpal tunnel syndrome G56.03    Ulnar neuropathy of right upper extremity G56.21       Allergies: Wasp venom and Penicillins    Medication list reviewed, changes made  Current Outpatient Medications   Medication Sig    cyclobenzaprine (FLEXERIL) 10 MG tablet Take 1 tablet (10 mg total) by mouth 2 times daily as needed for Muscle spasms    sertraline (ZOLOFT) 50 MG tablet TAKE 1 TABLET BY MOUTH EVERY DAY    benazepril (LOTENSIN) 10 MG tablet TAKE 1 TABLET BY MOUTH EVERY DAY    amLODIPine (NORVASC) 10 MG tablet TAKE 1 TABLET BY MOUTH EVERY DAY    piroxicam (FELDENE) 20 MG capsule TAKE 1 CAPSULE BY MOUTH EVERY DAY     atorvastatin (LIPITOR) 20 MG tablet TAKE 1 TABLET BY MOUTH EVERY DAY WITH DINNER    acetaminophen (TYLENOL) 325 MG tablet Take by mouth every 6 hours as needed for Pain    aspirin 81 MG tablet Take 81 mg by mouth daily    EPINEPHrine 0.3 mg/0.3 mL auto-injector Inject 0.3 mLs (0.3 mg total) into the muscle as needed for Anaphylaxis     No current facility-administered medications for this visit.         Allergy list reviewed  Problem list reviewed    OBJECTIVE:  Vitals:    03/26/18 1500   BP: 112/70   Pulse: 54   Weight: 78.5 kg (173 lb)   Height: 1.778 m (_0 )     Alert, oriented, appears well.  Speaking and breathing comfortably    ASSESSMENT/PLAN:  Hypertension-blood pressure is on target, continue current medicines unchanged.    Impaired fasting glucose-blood sugar is on target, continue diet and exercise.  No medicines needed.  Planning to recheck labs in 6 months with my chart follow-up, and again in 12 months with appointment.    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    Bee sting allergy-I will prescribed an EpiPen's as needed.  I've also prescribed prednisone that he can add to his bee sting kit, to use with Benadryl when he is stung.  We will contact the allergy Department to find out if they do desensitization.    Medication instructions were discussed with patient (and advocate). Advised that written instructions will also be provided by the pharmacy. Anticipated side effects discussed. Encouraged patient to call with any questions or concerns.

## 2018-03-29 ENCOUNTER — Encounter: Payer: Self-pay | Admitting: Primary Care

## 2018-04-14 ENCOUNTER — Ambulatory Visit: Payer: BLUE CROSS/BLUE SHIELD | Attending: Sports Medicine | Admitting: Sports Medicine

## 2018-04-14 ENCOUNTER — Encounter: Payer: Self-pay | Admitting: Sports Medicine

## 2018-04-14 VITALS — BP 121/73 | HR 70 | Ht 70.0 in | Wt 173.0 lb

## 2018-04-14 DIAGNOSIS — Z9889 Other specified postprocedural states: Secondary | ICD-10-CM

## 2018-04-14 DIAGNOSIS — M94261 Chondromalacia, right knee: Secondary | ICD-10-CM

## 2018-04-14 DIAGNOSIS — M170 Bilateral primary osteoarthritis of knee: Secondary | ICD-10-CM

## 2018-04-14 NOTE — Patient Instructions (Addendum)
Prior authorization, billing and scheduling typically take up to 2 weeks or possibly longer. If there is any deductible, outstanding balance, or % of money owed, you will receive a call from [place] [phone]) in our [place] payment will be required by (CREDIT CARD ONLY) before scheduling appointments. Once Authorization & Billing are taken care of, you will receive a call from [facility] [phone]) to schedule your appointment(s).   Viscosupplementation injections cannot be given the same day without prior authorization.     07/01/2018 earliest for synvisc 1 injection

## 2018-04-14 NOTE — Progress Notes (Signed)
Answers for HPI/ROS submitted by the patient on 04/08/2018   RIGHT KNEE PAIN HPI  What is your goal for today's visit?: New patient consultant  Date of onset: : 10/08/2014  Was this the result of an injury?: Yes  What is your pain level?: 5/10  Please describe the quality of your pain: : aching, catching, clicking, cramping, locking, sharp, shooting  What diagnostic workup have you had for this condition?: MRI scan, X-ray  What treatments have you tried for this condition?: acetominophen, bracing, glucosamine, heat, ice, NSAIDs, physical therapy  Progression since onset: : gradually worsening  Is this a work related condition? : No  Current work status: : usual activities  Fever: No  Chills: No  Weight loss: No  Malaise/fatigue: No  Rash: No  Dry skin: No  Itching: No  Wound : No  Headache: No  Hearing loss: No  Sore throat: No  Dental problem: No  Blindness: No  Visual disturbance: No  Chest pain: No  Palpitations: No  Leg cramps with exercise: No  Leg swelling: No  Fainting: No  Heartburn: No  Nausea: No  Vomiting: No  Diarrhea: No  Constipation: No  Blood in stool: No  Cough : No  Apnea: No  Shortness of breath: No  Frequency: No  Urgency: No  Blood in urine: No  Incomplete emptying: No  Falls: No  Neck pain: No  Back pain: No  Joint swelling: Yes  Muscle spasms: No  Muscle cramps: No  Easy to bruise/bleed: No  Frequent infections: No  Numbness: No  Tingling: No  Seizures: No  Loss of consciousness: No  Weakness: Yes  Depression: No  Nervous/anxious: No  Memory loss: No  C.C.: Presents for knee pain     HISTORY OF PRESENT ILLNESS    Lance Peters is a 58 y.o. male presents for evaluation and treatment of  bilateral knee pain. He presents as a new consult to my office, was a former patient of Dr Noland Fordyce.  This is evaluated as a no injury. He reports benefit with both HA injections and corticosteroid injections for 3 months. The pain began several years ago.  Mechanism of injury : none. Current symptoms include crepitus  sensation, stiffness and R knee swelling.The pain is located medial.  He describes the symptoms as aching. Symptoms improve with rest, avoiding painful activities. The symptoms are worse with activity, stair climbing, rotation, kneeling, deep knee bending, getting up from the floor, sitting for prolonged periods of time. The knees have not given out or felt unstable. The patient can bend and straighten the knee fully.  Denies mechanical symptoms.   Patient reports history of prior problems with this knee.  Evaluation to date: plain films, which were documented and reviewed as below.     Listing & description of previous non surgical right knee treatment:  Activity modification: Y  Physical Therapy:Y  Trial of medications:piroxicam, tylenol, advil    Weight loss: N  Assisted ambulatory device: N  Intra-articular injection: synvisc 1, corticosteroid   Bracing, orthotic devices: Y, unloader  Contraindications to above treatment: N     Employment: Lineman     Patient has previously been seen for this. Consult requested by No ref. provider found     Patient's medications, allergies, and surgical histories: Per patient questionnaire, reviewed and confirmed.  Past medical history: Per patient questionnaire, reviewed and confirmed.  Details include   Past Medical History:   Diagnosis Date    Arthritis  elbows and shoulders     Chest pain, unspecified     Depression 05/23/2014    Neuromuscular disorder     elbows, shoulders    Unspecified essential hypertension 06/02/2013       Social history: Per patient questionnaire, reviewed and confirmed.   reports that she has never smoked. She does not have any smokeless tobacco history on file..  Family history: Per patient questionnaire, reviewed and confirmed.    Family History   Problem Relation Age of Onset    Colon polyps Mother     Cancer Mother     Colon cancer Neg Hx      Past Surgical History:   Procedure Laterality Date    HAND SURGERY      trigger finger  release     PR ARTHRS KNE SURG W/MENISCECTOMY MED/LAT W/SHVG Right 07/16/2016    Procedure: KNEE ARTHROSCOPY WITH PARTIAL MEDIAL MENISCECTOMY;  Surgeon: Jannifer Franklin, MD;  Location: [facility];  Service: Orthopedics         Evaluation to date: I personally reviewed patient's X-ray which shows  4 VIEWS RIGHT KNEE, 4 VIEWS LEFT KNEE, X-RAYS    CLINICAL INFORMATION:   pain ; M25.561-Pain in right kneeM25.562-Pain in left UUVOZ36.64-QIHKV chronic pain    COMPARISON:  Right knee radiographs from 04/15/2016. Bilateral knee radiographs from 08/02/2014.    PROCEDURE: 4 projections of the right knee and 4 projections of the left knee were obtained.    FINDINGS/IMPRESSION: Right knee: Mild degenerative changes of the right knee, not significant changed compared with the prior examination. Trace joint effusion. Possible intra-articular bodies posteriorly. No acute findings.    Left knee: Mild tricompartmental degenerative changes, similar to prior. Possible intra-articular bodies anteriorly and posteriorly. Trace joint effusion. No acute findings.  Diminutive posterior horn and body medial meniscus likely representing prior partial meniscectomy. A superimposed new tear is considered less likely.      Medial compartment degenerative changes with cartilage thinning.      Thinning and irregularity of medial patellar facet articular cartilage.      Small knee joint effusion.      Small loose body 3 mm centrally within the knee.        A radiologist report is available for review.     ROS:  Review of systems:  Per patient questionnaire.   Denies fever, chills, rash, night sweats, unintentional weight loss.  All other systems negative.         OBJECTIVE:   Vitals:    04/14/18 1516   BP: 121/73   Pulse: 70   Weight: 78.5 kg (173 lb)   Height: 1.778 m (5\' 10" )       General: WDWN and no acute distress male.  Mental Status:  Alert and oriented x3, pleasant and cooperative with exam.  Extremities: No swelling, erythema, ecchymosis  or edema.  Gait:  Nonantalgic     Musculoskeletal:   KNEE: No swelling, erythema, ecchymosis or deformity. no palpable effusion or warmth. Tender to palpation: none.  Range of motion symmetric to opposite side. Neurovascularly intact.  crepitus.  Stable ligamentous testing.      Anterior Drawer: negative  Posterior Drawer: negative  Lachman's: negative  Valgus stress: stable   Varus stress: stable      / PLAN:  Impression:   58 y.o.  M with mild knee OA, chondromalacia R knee with loose body, s/p knee arthroscopy.  After discussion today on non-operative treatment options, patient elected to undergo repeat synvisc-1  injection given historical benefit.      Plan:     ICD-10-CM ICD-9-CM   1. Primary osteoarthritis of both knees M17.0 715.16   2. 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 Status post arthroscopic partial medial meniscectomy Z98.890 V45.89     Will obtain prior authorization for HA injection/s.  Will need to be given 06/2018  Knee AAOS HEP provided and encouraged     -We discussed that osteoarthritis is a chronic and usually progressive disease.  It may be caused from injury.  Treatment is patient driven. Simple interventions for arthritis were discussed. I recommended the use of ice to the joint, topical pain relievers, OTC oral pain relievers such ibuprofen (Advil/Motrin), acetaminophen (Tylenol) or naproxen (Alleve) as appropriate given medical comorbidities.  I recommended the use of a brace or sleeve when appropriate, weight loss or maintenance of proper weight, exercise as tolerated and avoidance of painful activities.  I did recommend steroid (cortisone) or hyaluronic acid viscosupplementation injections.  I did not recommend surgical consultation unless fails to improve.      Questions solicited and answered.     Precautions reviewed.  Medication changes, refills reviewed including directions, side effects and monitoring.    Encarnacion Chu, MD, FAAP, CAQ  Assistant professor,  Department of Orthopaedics    Primary Care-Internal Medicine/Pediatrics Sports Medicine & Orthopaedics  Non-operative Osteoarthritis/Adult Reconstruction     Faculty, [place]    Team Physician, [place]     [address], [place]   [place], [place] [address]  Office Phone: [phone]  Email: [email or web address]

## 2018-04-27 ENCOUNTER — Encounter: Payer: Self-pay | Admitting: Gastroenterology

## 2018-04-28 ENCOUNTER — Telehealth: Payer: Self-pay | Admitting: Sports Medicine

## 2018-04-28 NOTE — Telephone Encounter (Addendum)
1/22   OB  AE 04/07/17  B- 80%  EST 578.88  To Collect $200.00 DED balance.     Once patient has met his deductible he is covered in full.       ----- Message from Hattie Perch sent at 04/27/2018 10:30 AM EST -----  Regarding: RE: [place]/SYNVISC  Hello,     I checked with [employer] [place], no authorization required for Synvisc 1 Injection.     CPT: J7325/20611  ICD-10: M17.11  Reference #: [id number]  Authorization information is located in Referral # [id number], and is ready to be assigned to the appointment when it is scheduled.    Thank you,   Janett Billow    ----- Message -----  From: Beatris Ship  Sent: 04/14/2018   4:01 PM EST  To: Ortho Prior Auth Team  Subject: KARIPID[place]/SYNVISC                          Name of injection: SYNVISC  Body part: RT KNEE  ICD-10 Code: M17.11  CPT Code (if known): U2725,36644    AUTH NEEDED?    Payor: OUT OF AREA BC/BS Plan: [employer] [place]  Effective From:  Subscriber ID: [id number]        ----- Message -----  From: Leona Carry, MD  Sent: 04/14/2018   3:44 PM EST  To: Beatris Ship    R knee synvisc 1 Josem Kaufmann

## 2018-04-28 NOTE — Telephone Encounter (Signed)
Spoke with patient, he needs to have the injection done in March. He has requested that we call him back mid february to collect if needed.

## 2018-06-18 ENCOUNTER — Other Ambulatory Visit: Payer: Self-pay | Admitting: Primary Care

## 2018-07-03 ENCOUNTER — Other Ambulatory Visit: Payer: Self-pay | Admitting: Primary Care

## 2018-07-16 ENCOUNTER — Telehealth: Payer: Self-pay | Admitting: Sports Medicine

## 2018-07-16 NOTE — Telephone Encounter (Signed)
Patient called to schedule Synvisc injection. Should he be scheduled or are you going to contact him?

## 2018-07-17 ENCOUNTER — Encounter: Payer: Self-pay | Admitting: Sports Medicine

## 2018-08-03 ENCOUNTER — Other Ambulatory Visit: Payer: Self-pay | Admitting: Primary Care

## 2018-08-03 NOTE — Telephone Encounter (Signed)
lov  03/26/18  fuv  03/28/19  Last labs  03/26/18

## 2018-08-04 ENCOUNTER — Telehealth: Payer: Self-pay | Admitting: Sports Medicine

## 2018-08-04 NOTE — Telephone Encounter (Signed)
08/04/2018 I spoke with patient he is aware of the amount he will owe he said he can either pay at time of visit or be billed as we are not sure when he will be booked due to the covid 19-LT     Sending a note back to prior auth to recheck if Josem Kaufmann is needed  [employer]

## 2018-09-10 ENCOUNTER — Ambulatory Visit: Payer: BLUE CROSS/BLUE SHIELD | Admitting: Sports Medicine

## 2018-10-04 ENCOUNTER — Ambulatory Visit: Payer: BLUE CROSS/BLUE SHIELD | Admitting: Sports Medicine

## 2018-10-04 ENCOUNTER — Encounter: Payer: Self-pay | Admitting: Sports Medicine

## 2018-10-04 VITALS — BP 123/74 | HR 65 | Ht 70.0 in | Wt 170.0 lb

## 2018-10-04 DIAGNOSIS — M94261 Chondromalacia, right knee: Secondary | ICD-10-CM

## 2018-10-04 DIAGNOSIS — M170 Bilateral primary osteoarthritis of knee: Secondary | ICD-10-CM

## 2018-10-04 MED ORDER — LIDOCAINE HCL 1 % IJ SOLN *I*
1.0000 mL | Freq: Once | INTRAMUSCULAR | Status: AC | PRN
Start: 2018-10-04 — End: 2018-10-04
  Administered 2018-10-04: 1 mL via INTRA_ARTICULAR

## 2018-10-04 MED ORDER — HYLAN 48 MG/6ML IX SOSY *I*
48.0000 mg | PREFILLED_SYRINGE | Freq: Once | INTRA_ARTICULAR | Status: AC | PRN
Start: 2018-10-04 — End: 2018-10-04
  Administered 2018-10-04: 48 mg via INTRA_ARTICULAR

## 2018-10-04 MED ORDER — ETHYL CHLORIDE EX AERO (MULTIPLE PATIENTS) *I*
1.0000 | INHALATION_SPRAY | Freq: Once | CUTANEOUS | Status: AC | PRN
Start: 2018-10-04 — End: 2018-10-04
  Administered 2018-10-04: 12:00:00 1 via TOPICAL

## 2018-10-04 MED ORDER — ETHYL CHLORIDE EX AERO (MULTIPLE PATIENTS) *I*
1.0000 | INHALATION_SPRAY | Freq: Once | CUTANEOUS | Status: AC | PRN
Start: 2018-10-04 — End: 2018-10-04
  Administered 2018-10-04: 1 via TOPICAL

## 2018-10-04 MED ORDER — LIDOCAINE HCL 1 % IJ SOLN *I*
1.5000 mL | Freq: Once | INTRAMUSCULAR | Status: AC | PRN
Start: 2018-10-04 — End: 2018-10-04
  Administered 2018-10-04: 1.5 mL via INTRA_ARTICULAR

## 2018-10-04 MED ORDER — LIDOCAINE HCL 1 % IJ SOLN *I*
1.0000 mL | Freq: Once | INTRAMUSCULAR | Status: AC | PRN
Start: 2018-10-04 — End: 2018-10-04
  Administered 2018-10-04: 12:00:00 1 mL via INTRA_ARTICULAR

## 2018-10-04 MED ORDER — BETAMETHASONE ACET & SOD PHOS 6 (3-3) MG/ML IJ SUSP *I*
12.0000 mg | Freq: Once | INTRAMUSCULAR | Status: AC | PRN
Start: 2018-10-04 — End: 2018-10-04
  Administered 2018-10-04: 12 mg via INTRA_ARTICULAR

## 2018-10-04 MED ORDER — LIDOCAINE HCL 1 % IJ SOLN *I*
1.5000 mL | Freq: Once | INTRAMUSCULAR | Status: AC | PRN
Start: 2018-10-04 — End: 2018-10-04
  Administered 2018-10-04: 12:00:00 1.5 mL via INTRA_ARTICULAR

## 2018-10-04 NOTE — Progress Notes (Signed)
Answers for HPI/ROS submitted by the patient on 04/08/2018   RIGHT KNEE PAIN HPI  What is your goal for today's visit?: New patient consultant  Date of onset: : 10/08/2014  Was this the result of an injury?: Yes  What is your pain level?: 5/10  Please describe the quality of your pain: : aching, catching, clicking, cramping, locking, sharp, shooting  What diagnostic workup have you had for this condition?: MRI scan, X-ray  What treatments have you tried for this condition?: acetominophen, bracing, glucosamine, heat, ice, NSAIDs, physical therapy  Progression since onset: : gradually worsening  Is this a work related condition? : No  Current work status: : usual activities  Fever: No  Chills: No  Weight loss: No  Malaise/fatigue: No  Rash: No  Dry skin: No  Itching: No  Wound : No  Headache: No  Hearing loss: No  Sore throat: No  Dental problem: No  Blindness: No  Visual disturbance: No  Chest pain: No  Palpitations: No  Leg cramps with exercise: No  Leg swelling: No  Fainting: No  Heartburn: No  Nausea: No  Vomiting: No  Diarrhea: No  Constipation: No  Blood in stool: No  Cough : No  Apnea: No  Shortness of breath: No  Frequency: No  Urgency: No  Blood in urine: No  Incomplete emptying: No  Falls: No  Neck pain: No  Back pain: No  Joint swelling: Yes  Muscle spasms: No  Muscle cramps: No  Easy to bruise/bleed: No  Frequent infections: No  Numbness: No  Tingling: No  Seizures: No  Loss of consciousness: No  Weakness: Yes  Depression: No  Nervous/anxious: No  Memory loss: No  C.C.: Presents for knee pain     HISTORY OF PRESENT ILLNESS    Lance Peters is a 58 y.o. male presents for follow up and treatment of  bilateral knee pain. He is s/p corticosteroid injections 12/209.  He reports benefit with both HA injections and corticosteroid injections for 3 months. The pain began several years ago.  Mechanism of injury : none. Current symptoms include crepitus sensation, stiffness and R knee swelling.The pain is located medial.   He describes the symptoms as aching. Symptoms improve with rest, avoiding painful activities. The symptoms are worse with activity, stair climbing, rotation, kneeling, deep knee bending, getting up from the floor, sitting for prolonged periods of time. The knees have not given out or felt unstable. The patient can bend and straighten the knee fully.  Denies mechanical symptoms.   Patient reports history of prior problems with this knee.  Evaluation to date: plain films, which were documented and reviewed as below.     Listing & description of previous non surgical right knee treatment:  Activity modification: Y  Physical Therapy:Y  Trial of medications:piroxicam, tylenol, advil   Weight loss: N  Assisted ambulatory device: N  Intra-articular injection: synvisc 1, corticosteroid   Bracing, orthotic devices: Y, unloader  Contraindications to above treatment: N     Employment: Lineman       Patient's medications, allergies, and surgical histories: Per patient questionnaire, reviewed and confirmed.  Past medical history: Per patient questionnaire, reviewed and confirmed.  Details include   Past Medical History:   Diagnosis Date    Arthritis     elbows and shoulders     Chest pain, unspecified     Depression 05/23/2014    Neuromuscular disorder     elbows, shoulders    Unspecified essential  hypertension 06/02/2013       Social history: Per patient questionnaire, reviewed and confirmed.   reports that she has never smoked. She does not have any smokeless tobacco history on file..  Family history: Per patient questionnaire, reviewed and confirmed.    Family History   Problem Relation Age of Onset    Colon polyps Mother     Cancer Mother     Colon cancer Neg Hx      Past Surgical History:   Procedure Laterality Date    HAND SURGERY      trigger finger release     PR ARTHRS KNE SURG W/MENISCECTOMY MED/LAT W/SHVG Right 07/16/2016    Procedure: KNEE ARTHROSCOPY WITH PARTIAL MEDIAL MENISCECTOMY;  Surgeon: Jannifer Franklin, MD;  Location: [facility];  Service: Orthopedics         Evaluation to date: I personally reviewed patient's X-ray which shows  4 VIEWS RIGHT KNEE, 4 VIEWS LEFT KNEE, X-RAYS    CLINICAL INFORMATION:   pain ; M25.561-Pain in right kneeM25.562-Pain in left OEUMP53.61-WERXV chronic pain    COMPARISON:  Right knee radiographs from 04/15/2016. Bilateral knee radiographs from 08/02/2014.    PROCEDURE: 4 projections of the right knee and 4 projections of the left knee were obtained.    FINDINGS/IMPRESSION: Right knee: Mild degenerative changes of the right knee, not significant changed compared with the prior examination. Trace joint effusion. Possible intra-articular bodies posteriorly. No acute findings.    Left knee: Mild tricompartmental degenerative changes, similar to prior. Possible intra-articular bodies anteriorly and posteriorly. Trace joint effusion. No acute findings.  Diminutive posterior horn and body medial meniscus likely representing prior partial meniscectomy. A superimposed new tear is considered less likely.      Medial compartment degenerative changes with cartilage thinning.      Thinning and irregularity of medial patellar facet articular cartilage.      Small knee joint effusion.      Small loose body 3 mm centrally within the knee.        A radiologist report is available for review.     ROS:  Review of systems:  Per patient questionnaire.   Denies fever, chills, rash, night sweats, unintentional weight loss.  All other systems negative.         OBJECTIVE:   Vitals:    10/04/18 1128   BP: 123/74   Pulse: 65   Weight: 77.1 kg (170 lb)   Height: 1.778 m (5\' 10" )       General: WDWN and no acute distress male.  Mental Status:  Alert and oriented x3, pleasant and cooperative with exam.  Extremities: No swelling, erythema, ecchymosis or edema.  Gait:  Nonantalgic     Musculoskeletal:   KNEE: No swelling, erythema, ecchymosis or deformity. no palpable effusion or warmth. Tender to palpation:  none.  Range of motion symmetric to opposite side. Neurovascularly intact.  crepitus.  Stable ligamentous testing.      Anterior Drawer: negative  Posterior Drawer: negative  Lachman's: negative  Valgus stress: stable   Varus stress: stable      / PLAN:  Impression:   58 y.o.  M with mild knee OA, chondromalacia R knee with loose body, s/p knee arthroscopy.  After discussion today on non-operative treatment options, patient elected to undergo repeat synvisc-1 injection to R knee and corticosteroid injection to L knee given historical benefit.      Plan:     ICD-10-CM ICD-9-CM   1. Primary osteoarthritis of both  knees  M17.0 715.16   2. Chondromalacia, right knee  M94.261 717.7       Knee AAOS HEP encouraged     -We discussed that osteoarthritis is a chronic and usually progressive disease.  It may be caused from injury.  Treatment is patient driven. Simple interventions for arthritis were discussed. I recommended the use of ice to the joint, topical pain relievers, OTC oral pain relievers such ibuprofen (Advil/Motrin), acetaminophen (Tylenol) or naproxen (Alleve) as appropriate given medical comorbidities.  I recommended the use of a brace or sleeve when appropriate, weight loss or maintenance of proper weight, exercise as tolerated and avoidance of painful activities.  I did recommend steroid (cortisone) or hyaluronic acid viscosupplementation injections.  I did not recommend surgical consultation unless fails to improve.    -May electively repeat HA injection in 6 months if proves to be effective in duration and symptomatic relief.     Questions solicited and answered.     Precautions reviewed.  Medication changes, refills reviewed including directions, side effects and monitoring.    Encarnacion Chu, MD, FAAP, CAQ  Assistant professor, Department of Orthopaedics    Primary Care-Internal Medicine/Pediatrics Sports Medicine & Orthopaedics  Non-operative Osteoarthritis/Adult Reconstruction     Faculty, [place]    Team Physician, [place]     [address], [place]   [place], [place] [address]  Office Phone: [phone]

## 2018-10-04 NOTE — Procedures (Signed)
Large Joint Aspiration/Injection Procedure: R knee intra - articular    Date/Time: 10/04/2018 11:15 AM EDT  Consent given by: patient  Site marked: site marked  Timeout: Immediately prior to procedure a time out was called to verify the correct patient, procedure, equipment, support staff and site/side marked as required     Procedure Details    Location: knee - R knee intra - articular  Preparation: The site was prepped using the usual aseptic technique.  Anesthetics administered: 1 spray ethyl chloride; 1 mL lidocaine hcl 1 %; 1 mL lidocaine hcl 1 %; 1.5 mL lidocaine hcl 1 %  Viscosupplementation & other medications administered: 48 mg hylan 48 MG/6ML  Dressing:  A dry, sterile dressing was applied.  Patient tolerance: patient tolerated the procedure well with no immediate complications

## 2018-10-04 NOTE — Patient Instructions (Signed)
Injection Instructions    · The injection you received today may take 5-7 days to work in the case of cortisone or 3 weeks or more for hyaluronic acid viscosupplements (Synvisc, Hyalgan, Orthovisc or others).   · You may find the area that was injected to be more painful for 1 or 2 days after the injection, after the numbing medicine wears off.  This happens as the medicine is being absorbed in your body.  · Rest for 2-3 days, activities of daily living are fine.   · It may be helpful to put ice on the body part that was injected to ease the pain and rest the joint for today.  · You may also use pain medication - Tylenol, Advil/Motrin or Aleve as appropriate.  · A cortisone injection may cause systemic reactions, including flushing, feeling hot, irritability or inability to sleep.  · If you are diabetic, a cortisone injection can increase your blood sugar level for several days.  Monitor your glucose levels closely.  · Contact the office [phone] if you feel ill, have excessive pain, if the area turns red or you have a fever

## 2018-10-04 NOTE — Procedures (Signed)
Large Joint Aspiration/Injection Procedure: L knee intra - articular    Date/Time: 10/04/2018 11:15 AM EDT  Consent given by: patient  Site marked: site marked  Timeout: Immediately prior to procedure a time out was called to verify the correct patient, procedure, equipment, support staff and site/side marked as required     Procedure Details    Location: knee - L knee intra - articular  Preparation: The site was prepped using the usual aseptic technique.  Anesthetics administered: 1 spray ethyl chloride; 1 mL lidocaine hcl 1 %; 1.5 mL lidocaine hcl 1 %; 1 mL lidocaine hcl 1 %  Intra-Articular Steroids administered: 12 mg betamethasone acetate & sodium phosphate 6 (3-3) MG/ML  Dressing:  A dry, sterile dressing was applied.  Patient tolerance: patient tolerated the procedure well with no immediate complications

## 2018-12-03 ENCOUNTER — Telehealth: Payer: Self-pay | Admitting: Primary Care

## 2018-12-03 NOTE — Telephone Encounter (Signed)
Pt is requesting  A refill of prednisone, he keeps this on hand for bee tings and had to use it this past week, [employer].

## 2018-12-04 ENCOUNTER — Encounter: Payer: Self-pay | Admitting: Primary Care

## 2018-12-04 MED ORDER — PREDNISONE 50 MG PO TABS *I*
50.0000 mg | ORAL_TABLET | Freq: Every day | ORAL | 0 refills | Status: AC
Start: 2018-12-04 — End: 2018-12-07

## 2018-12-04 NOTE — Telephone Encounter (Signed)
done

## 2018-12-16 ENCOUNTER — Other Ambulatory Visit: Payer: Self-pay | Admitting: Primary Care

## 2018-12-16 NOTE — Telephone Encounter (Signed)
LOV  03/26/18  FUV  03/28/19  LABS 03/22/18, FUTURE ORDERED

## 2018-12-21 ENCOUNTER — Other Ambulatory Visit: Payer: Self-pay | Admitting: Primary Care

## 2018-12-21 NOTE — Telephone Encounter (Signed)
LOV  12.20.19  NOV  12.21.20  LABS  12.16.19

## 2019-01-22 ENCOUNTER — Other Ambulatory Visit: Payer: Self-pay | Admitting: Primary Care

## 2019-02-03 ENCOUNTER — Ambulatory Visit
Admission: RE | Admit: 2019-02-03 | Discharge: 2019-02-03 | Disposition: A | Discharge status: Home / Self Care | Payer: BLUE CROSS/BLUE SHIELD | Source: Ambulatory Visit | Attending: Sports Medicine | Admitting: Sports Medicine

## 2019-02-03 ENCOUNTER — Ambulatory Visit: Payer: BLUE CROSS/BLUE SHIELD | Admitting: Sports Medicine

## 2019-02-03 ENCOUNTER — Other Ambulatory Visit: Payer: Self-pay | Admitting: Primary Care

## 2019-02-03 VITALS — Ht 70.0 in | Wt 170.0 lb

## 2019-02-03 DIAGNOSIS — M94261 Chondromalacia, right knee: Secondary | ICD-10-CM | POA: Insufficient documentation

## 2019-02-03 DIAGNOSIS — M25461 Effusion, right knee: Secondary | ICD-10-CM | POA: Insufficient documentation

## 2019-02-03 DIAGNOSIS — M170 Bilateral primary osteoarthritis of knee: Secondary | ICD-10-CM | POA: Insufficient documentation

## 2019-02-03 DIAGNOSIS — Z9889 Other specified postprocedural states: Secondary | ICD-10-CM

## 2019-02-03 DIAGNOSIS — M25462 Effusion, left knee: Secondary | ICD-10-CM | POA: Insufficient documentation

## 2019-02-03 MED ORDER — LIDOCAINE HCL 1 % IJ SOLN *I*
1.0000 mL | Freq: Once | INTRAMUSCULAR | Status: AC | PRN
Start: 2019-02-03 — End: 2019-02-03
  Administered 2019-02-03: 1 mL via INTRA_ARTICULAR

## 2019-02-03 MED ORDER — LIDOCAINE HCL 1 % IJ SOLN *I*
1.0000 mL | Freq: Once | INTRAMUSCULAR | Status: AC | PRN
Start: 2019-02-03 — End: 2019-02-03
  Administered 2019-02-03: 14:00:00 1 mL via INTRA_ARTICULAR

## 2019-02-03 MED ORDER — ETHYL CHLORIDE EX AERO (MULTIPLE PATIENTS) *I*
1.0000 | INHALATION_SPRAY | Freq: Once | CUTANEOUS | Status: AC | PRN
Start: 2019-02-03 — End: 2019-02-03
  Administered 2019-02-03: 14:00:00 1 via TOPICAL

## 2019-02-03 MED ORDER — LIDOCAINE HCL 1 % IJ SOLN *I*
1.5000 mL | Freq: Once | INTRAMUSCULAR | Status: AC | PRN
Start: 2019-02-03 — End: 2019-02-03
  Administered 2019-02-03: 1.5 mL via INTRA_ARTICULAR

## 2019-02-03 MED ORDER — BETAMETHASONE ACET & SOD PHOS 6 (3-3) MG/ML IJ SUSP *I*
12.0000 mg | Freq: Once | INTRAMUSCULAR | Status: AC | PRN
Start: 2019-02-03 — End: 2019-02-03
  Administered 2019-02-03: 14:00:00 12 mg via INTRA_ARTICULAR

## 2019-02-03 MED ORDER — LIDOCAINE HCL 1 % IJ SOLN *I*
1.5000 mL | Freq: Once | INTRAMUSCULAR | Status: AC | PRN
Start: 2019-02-03 — End: 2019-02-03
  Administered 2019-02-03: 14:00:00 1.5 mL via INTRA_ARTICULAR

## 2019-02-03 NOTE — Procedures (Signed)
Large Joint Aspiration/Injection Procedure: R knee intra - articular    Date/Time: 02/03/2019  2:15 PM EDT  Consent given by: patient  Site marked: site marked  Timeout: Immediately prior to procedure a time out was called to verify the correct patient, procedure, equipment, support staff and site/side marked as required     Procedure Details    Location: knee - R knee intra - articular  Preparation: The site was prepped using the usual aseptic technique.  Anesthetics administered: 1 spray ethyl chloride; 1 mL lidocaine hcl 1 %; 1.5 mL lidocaine hcl 1 %; 1 mL lidocaine hcl 1 %  Intra-Articular Steroids administered: 12 mg betamethasone acetate & sodium phosphate 6 (3-3) MG/ML  Dressing:  A dry, sterile dressing was applied.  Patient tolerance: patient tolerated the procedure well with no immediate complications

## 2019-02-03 NOTE — Telephone Encounter (Signed)
Last OV 03/26/18  Next OV 03/28/19  Future labs ordered.

## 2019-02-03 NOTE — Procedures (Signed)
Large Joint Aspiration/Injection Procedure: L knee intra - articular    Date/Time: 02/03/2019  2:15 PM EDT  Consent given by: patient  Site marked: site marked  Timeout: Immediately prior to procedure a time out was called to verify the correct patient, procedure, equipment, support staff and site/side marked as required     Procedure Details    Location: knee - L knee intra - articular  Preparation: The site was prepped using the usual aseptic technique.  Anesthetics administered: 1 spray ethyl chloride; 1 mL lidocaine hcl 1 %; 1.5 mL lidocaine hcl 1 %; 1 mL lidocaine hcl 1 %  Intra-Articular Steroids administered: 12 mg betamethasone acetate & sodium phosphate 6 (3-3) MG/ML  Dressing:  A dry, sterile dressing was applied.  Patient tolerance: patient tolerated the procedure well with no immediate complications

## 2019-02-03 NOTE — Progress Notes (Signed)
Answers for HPI/ROS submitted by the patient on 02/01/2019   RIGHT KNEE PAIN HPI  What is your goal for today's visit?: Pain relief  Date of onset: : 01/06/2019  Was this the result of an injury?: No  What is your pain level?: 7/10  Please describe the quality of your pain: : aching, locking, popping, sharp, sore, stiffness  What diagnostic workup have you had for this condition?: MRI scan  What treatments have you tried for this condition?: surgery  Progression since onset: : rapidly worsening  Is this a work related condition? : No  Current work status: : usual activities  Fever: No  Chills: No  Numbness: No  Tingling: No  Answers for HPI/ROS submitted by the patient on 04/08/2018   RIGHT KNEE PAIN HPI  What is your goal for today's visit?: New patient consultant  Date of onset: : 10/08/2014  Was this the result of an injury?: Yes  What is your pain level?: 5/10  Please describe the quality of your pain: : aching, catching, clicking, cramping, locking, sharp, shooting  What diagnostic workup have you had for this condition?: MRI scan, X-ray  What treatments have you tried for this condition?: acetominophen, bracing, glucosamine, heat, ice, NSAIDs, physical therapy  Progression since onset: : gradually worsening  Is this a work related condition? : No  Current work status: : usual activities  Fever: No  Chills: No  Weight loss: No  Malaise/fatigue: No  Rash: No  Dry skin: No  Itching: No  Wound : No  Headache: No  Hearing loss: No  Sore throat: No  Dental problem: No  Blindness: No  Visual disturbance: No  Chest pain: No  Palpitations: No  Leg cramps with exercise: No  Leg swelling: No  Fainting: No  Heartburn: No  Nausea: No  Vomiting: No  Diarrhea: No  Constipation: No  Blood in stool: No  Cough : No  Apnea: No  Shortness of breath: No  Frequency: No  Urgency: No  Blood in urine: No  Incomplete emptying: No  Falls: No  Neck pain: No  Back pain: No  Joint swelling: Yes  Muscle spasms: No  Muscle cramps: No  Easy to  bruise/bleed: No  Frequent infections: No  Numbness: No  Tingling: No  Seizures: No  Loss of consciousness: No  Weakness: Yes  Depression: No  Nervous/anxious: No  Memory loss: No  C.C.: Presents for knee pain     HISTORY OF PRESENT ILLNESS    Izayah Guillory is a 58 y.o. male presents for follow up and treatment of  bilateral knee pain. He is s/p L knee corticosteroid injection, R knee synvisc-1 injection 10/04/2018 with relief until a few weeks ago.  He does note an episode of swelling in his R knee without fevers or chills.    He is s/p corticosteroid injections 03/2018.  He reports benefit with both HA injections and corticosteroid injections for 3 months. The pain began several years ago.  Mechanism of injury : none. Current symptoms include crepitus sensation, stiffness and R knee swelling.The pain is located medial.  He describes the symptoms as aching. Symptoms improve with rest, avoiding painful activities. The symptoms are worse with activity, stair climbing, rotation, kneeling, deep knee bending, getting up from the floor, sitting for prolonged periods of time. The knees have not given out or felt unstable. The patient can bend and straighten the knee fully.  Denies mechanical symptoms.   Patient reports history of prior problems  with this knee.  Evaluation to date: plain films, which were documented and reviewed as below.     Listing & description of previous non surgical right knee treatment:  Activity modification: Y  Physical Therapy:Y  Trial of medications:piroxicam, tylenol, advil   Weight loss: N  Assisted ambulatory device: N  Intra-articular injection: synvisc 1, corticosteroid   Bracing, orthotic devices: Y, unloader  Contraindications to above treatment: N     Employment: Lineman       Patient's medications, allergies, and surgical histories: Per patient questionnaire, reviewed and confirmed.  Past medical history: Per patient questionnaire, reviewed and confirmed.  Details include   Past Medical  History:   Diagnosis Date    Arthritis     elbows and shoulders     Chest pain, unspecified     Depression 05/23/2014    Neuromuscular disorder     elbows, shoulders    Unspecified essential hypertension 06/02/2013       Social history: Per patient questionnaire, reviewed and confirmed.   reports that she has never smoked. She does not have any smokeless tobacco history on file..  Family history: Per patient questionnaire, reviewed and confirmed.    Family History   Problem Relation Age of Onset    Colon polyps Mother     Cancer Mother     Colon cancer Neg Hx      Past Surgical History:   Procedure Laterality Date    HAND SURGERY      trigger finger release     PR ARTHRS KNE SURG W/MENISCECTOMY MED/LAT W/SHVG Right 07/16/2016    Procedure: KNEE ARTHROSCOPY WITH PARTIAL MEDIAL MENISCECTOMY;  Surgeon: Jannifer Franklin, MD;  Location: [facility];  Service: Orthopedics         Evaluation to date: I personally reviewed patient's X-ray which shows  4 VIEWS RIGHT KNEE, 4 VIEWS LEFT KNEE, X-RAYS    CLINICAL INFORMATION:   pain ; M25.561-Pain in right kneeM25.562-Pain in left [id number] chronic pain    COMPARISON:  Right knee radiographs from 04/15/2016. Bilateral knee radiographs from 08/02/2014.    PROCEDURE: 4 projections of the right knee and 4 projections of the left knee were obtained.    FINDINGS/IMPRESSION: Right knee: Mild degenerative changes of the right knee, not significant changed compared with the prior examination. Trace joint effusion. Possible intra-articular bodies posteriorly. No acute findings.    Left knee: Mild tricompartmental degenerative changes, similar to prior. Possible intra-articular bodies anteriorly and posteriorly. Trace joint effusion. No acute findings.  Diminutive posterior horn and body medial meniscus likely representing prior partial meniscectomy. A superimposed new tear is considered less likely.      Medial compartment degenerative changes with cartilage thinning.       Thinning and irregularity of medial patellar facet articular cartilage.      Small knee joint effusion.      Small loose body 3 mm centrally within the knee.     Knee Arthritis Right 4 Views Left 4 Views    Result Date: 02/03/2019  02/03/2019 2:21 PM 4 VIEWS RIGHT KNEE, 4 VIEWS LEFT KNEE, X-RAYS CLINICAL INFORMATION:  OA, M17.0-Bilateral primary osteoarthritis of kneeM94.261-Chondromalacia, right [id number] specified postprocedural states. COMPARISON:  09/15/17 PROCEDURE: 4 projections of the right knee and 4 projections of the left knee were obtained. IMPRESSION/FINDINGS:  There is no acute osseous or articular abnormality. Mild to moderate narrowing of the right medial compartment and mild narrowing of the left medial compartment are seen. The lateral compartment heights are maintained.,  Bilateral small knee joint effusions are noted. The patellae are located in their trochlear grooves. END OF IMPRESSION [facility] submits this DICOM format image data and final report to the [facility], an independent secure electronic health information exchange, on a reciprocally searchable basis (with patient authorization) for a minimum of 12 months after exam date.     A radiologist report is available for review.     ROS:  Review of systems:  Per patient questionnaire.   Denies fever, chills, rash, night sweats, unintentional weight loss.  All other systems negative.         OBJECTIVE:   Vitals:    02/03/19 1411   Weight: 77.1 kg (170 lb)   Height: 1.778 m (5\' 10" )       General: WDWN and no acute distress male.  Mental Status:  Alert and oriented x3, pleasant and cooperative with exam.  Extremities: No swelling, erythema, ecchymosis or edema.  Gait:  Nonantalgic     Musculoskeletal:   KNEE: No swelling, erythema, ecchymosis or deformity. Trace R knee palpable effusion without warmth. Tender to palpation: none.  Range of motion symmetric to opposite side. Neurovascularly intact.  crepitus.  Stable ligamentous  testing.      Anterior Drawer: negative  Posterior Drawer: negative  Lachman's: negative  Valgus stress: stable   Varus stress: stable      / PLAN:  Impression:   58 y.o.  M with mild/moderate knee OA, chondromalacia R knee with loose body, s/p knee arthroscopy.  After discussion today on non-operative treatment options, patient elected to undergo repeat  corticosteroid injections  given historical benefit.      Plan:     ICD-10-CM ICD-9-CM   1. Primary osteoarthritis of both knees  M17.0 715.16   2. Chondromalacia, right knee  M94.261 717.7   3. Status post arthroscopic partial medial meniscectomy  Z98.890 V45.89       Knee AAOS HEP encouraged     -We discussed that osteoarthritis is a chronic and usually progressive disease.  It may be caused from injury.  Treatment is patient driven. Simple interventions for arthritis were discussed. I recommended the use of ice to the joint, topical pain relievers, OTC oral pain relievers such ibuprofen (Advil/Motrin), acetaminophen (Tylenol) or naproxen (Alleve) as appropriate given medical comorbidities.  I recommended the use of a brace or sleeve when appropriate, weight loss or maintenance of proper weight, exercise as tolerated and avoidance of painful activities.  I did recommend steroid (cortisone) or hyaluronic acid viscosupplementation injections.  I did not recommend surgical consultation unless fails to improve.    Will obtain prior authorization for HA injection/s.      -May electively repeat HA injection in 2 months if proves to be effective in duration and symptomatic relief.     Questions solicited and answered.     Precautions reviewed.  Medication changes, refills reviewed including directions, side effects and monitoring.    Encarnacion Chu, MD, FAAP, CAQ  Assistant professor, Department of Orthopaedics    Primary Care-Internal Medicine/Pediatrics Sports Medicine & Orthopaedics  Non-operative Osteoarthritis/Adult Reconstruction     Faculty, Organ    Team Physician, [facility]     [address], [place]   [place], [place] [address]  Office Phone: [phone]

## 2019-02-03 NOTE — Patient Instructions (Signed)
Injection Instructions    · The injection you received today may take 5-7 days to work in the case of cortisone or 3 weeks or more for hyaluronic acid viscosupplements (Synvisc, Hyalgan, Orthovisc or others).   · You may find the area that was injected to be more painful for 1 or 2 days after the injection, after the numbing medicine wears off.  This happens as the medicine is being absorbed in your body.  · Rest for 2-3 days, activities of daily living are fine.   · It may be helpful to put ice on the body part that was injected to ease the pain and rest the joint for today.  · You may also use pain medication - Tylenol, Advil/Motrin or Aleve as appropriate.  · A cortisone injection may cause systemic reactions, including flushing, feeling hot, irritability or inability to sleep.  · If you are diabetic, a cortisone injection can increase your blood sugar level for several days.  Monitor your glucose levels closely.  · Contact the office [phone] if you feel ill, have excessive pain, if the area turns red or you have a fever

## 2019-02-14 ENCOUNTER — Telehealth: Payer: Self-pay | Admitting: Primary Care

## 2019-02-14 DIAGNOSIS — N2889 Other specified disorders of kidney and ureter: Secondary | ICD-10-CM

## 2019-02-14 DIAGNOSIS — D1771 Benign lipomatous neoplasm of kidney: Secondary | ICD-10-CM

## 2019-02-14 NOTE — Telephone Encounter (Signed)
Advised pt:   -Last year we did a CT scan of his abdomen because of some pain.  It showed a small spot on his right kidney that is most likely benign, but we discussed repeating the CT scan around now, to ensure there have not been any changes.  I have gone ahead and ordered that CT.  Please help with arrangements    Pt given [employer] phone number and Req faxed      I called [facility] at [phone] to obtain PA for pt's CT Abdomen with and without, it has been approved from today through 03/15/19 PA# [id number]

## 2019-02-14 NOTE — Telephone Encounter (Signed)
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

## 2019-02-14 NOTE — Telephone Encounter (Signed)
Last year we did a CT scan of his abdomen because of some pain.  It showed a small spot on his right kidney that is most likely benign, but we discussed repeating the CT scan around now, to ensure there have not been any changes.  I have gone ahead and ordered that CT.  Please help with arrangements

## 2019-02-16 ENCOUNTER — Other Ambulatory Visit: Payer: Self-pay | Admitting: Gastroenterology

## 2019-02-18 ENCOUNTER — Telehealth: Payer: Self-pay | Admitting: Primary Care

## 2019-02-18 NOTE — Telephone Encounter (Signed)
Advised pt:      -Regarding the CT scan of his abdomen.  They identified the spot on the right kidney, and it appears to be benign.  No further imaging is needed.  This is good news,

## 2019-02-18 NOTE — Telephone Encounter (Signed)
Regarding the CT scan of his abdomen.  They identified the spot on the right kidney, and it appears to be benign.  No further imaging is needed.  This is good news,

## 2019-03-14 ENCOUNTER — Telehealth: Payer: Self-pay | Admitting: Oncology

## 2019-03-14 ENCOUNTER — Telehealth: Payer: Self-pay | Admitting: Gastroenterology

## 2019-03-14 NOTE — Telephone Encounter (Signed)
Procedure Type: COB  Sedation Type: mod  Procedure Location: [facility]  Date: 4.30  Arrival time: 8am  Dr. Francisco Capuchin, MD      COVID Testing Dates: 4.26  COVID Testing Site: [facility]  Pre-Procedure Testing [address] [place] [address] Monday - Friday 7:30 a.m. - 4 p.m. Saturday 8 a.m. - 12 p.m. (noon)  Phone: [phone]       Did patient answer yes to any prescreen questions? no    Is the pt on any anticoagulation? no      Patient verbalized understanding that [place] at a [facility] Pre-Procedure Testing Site is required or procedure will be postponed.   Preparation instructions have been sent via: Mychart/mailed

## 2019-03-14 NOTE — Telephone Encounter (Signed)
Copied from [place] [id number]. Topic: Appointments - Schedule Appointment  >> Mar 14, 2019 10:23 AM Maryann Alar wrote:  Patient called in and scheduled:COB  Writer reached out to backline line & Gerald Stabs took over scheduling for Patient.    Are you on any blood thinners? If yes, who prescribes them and what is their phone number? no  Are you diabetic? no  Do you have Kidney Failure or are you on Kidney Dialysis? no  Have you ever been diagnosed with cirrhosis of the liver or had banding for esophageal varices? no  Do you have an automated defibrillator? no  Do you have an LVAD? no  Do you have a tracheostomy? no  Do you use BIPAP or oxygen at home? no  Is the patients weight greater than 350lbs?no

## 2019-03-25 ENCOUNTER — Other Ambulatory Visit
Admission: RE | Admit: 2019-03-25 | Discharge: 2019-03-25 | Disposition: A | Discharge status: Home / Self Care | Payer: BLUE CROSS/BLUE SHIELD | Source: Ambulatory Visit | Attending: Primary Care | Admitting: Primary Care

## 2019-03-25 DIAGNOSIS — I10 Essential (primary) hypertension: Secondary | ICD-10-CM | POA: Insufficient documentation

## 2019-03-25 DIAGNOSIS — R7301 Impaired fasting glucose: Secondary | ICD-10-CM

## 2019-03-25 LAB — CBC
Hematocrit: 47 % (ref 40–51)
Hemoglobin: 14.7 g/dL (ref 13.7–17.5)
MCH: 26 pg/cell (ref 26–32)
MCHC: 31 g/dL — ABNORMAL LOW (ref 32–37)
MCV: 83 fL (ref 79–92)
Platelets: 215 10*3/uL (ref 150–330)
RBC: 5.7 MIL/uL (ref 4.6–6.1)
RDW: 12.9 % (ref 11.6–14.4)
WBC: 7.7 10*3/uL (ref 4.2–9.1)

## 2019-03-25 LAB — BASIC METABOLIC PANEL
Anion Gap: 12 (ref 7–16)
CO2: 26 mmol/L (ref 20–28)
Calcium: 10 mg/dL (ref 8.6–10.2)
Chloride: 102 mmol/L (ref 96–108)
Creatinine: 1.04 mg/dL (ref 0.67–1.17)
GFR,Black: 90 *
GFR,Caucasian: 78 *
Glucose: 99 mg/dL (ref 60–99)
Lab: 20 mg/dL (ref 6–20)
Potassium: 4.4 mmol/L (ref 3.3–5.1)
Sodium: 140 mmol/L (ref 133–145)

## 2019-03-25 LAB — LIPID PANEL
Chol/HDL Ratio: 3
Cholesterol: 134 mg/dL
HDL: 44 mg/dL (ref 40–60)
LDL Calculated: 70 mg/dL
Non HDL Cholesterol: 90 mg/dL
Triglycerides: 102 mg/dL

## 2019-03-26 LAB — HEMOGLOBIN A1C: Hemoglobin A1C: 6.1 % — ABNORMAL HIGH

## 2019-03-28 ENCOUNTER — Ambulatory Visit: Payer: BLUE CROSS/BLUE SHIELD | Admitting: Primary Care

## 2019-03-28 ENCOUNTER — Encounter: Payer: Self-pay | Admitting: Primary Care

## 2019-03-28 VITALS — BP 125/66 | HR 59 | Wt 175.0 lb

## 2019-03-28 DIAGNOSIS — F329 Major depressive disorder, single episode, unspecified: Secondary | ICD-10-CM

## 2019-03-28 DIAGNOSIS — I10 Essential (primary) hypertension: Secondary | ICD-10-CM

## 2019-03-28 DIAGNOSIS — R7301 Impaired fasting glucose: Secondary | ICD-10-CM

## 2019-03-28 DIAGNOSIS — F32A Depression, unspecified: Secondary | ICD-10-CM

## 2019-03-28 NOTE — Progress Notes (Signed)
Due to SARS-CoV-2 pandemic, this appointment was conducted via telemedicine, with patient's consent.    Video Visit     Location of Patient: home    Location of Telemedicine Provider: clinical office    Other participants in telemedicine encounter and roles:  na    This is an established patient visit.    Reason for visit: No chief complaint on file.      HPI  Overall feels pretty well.  He is a little bit disappointed that his hemoglobin A1c is slightly over 6 but he acknowledges a few dietary lapses due to the pandemic.    Otherwise he feels pretty well.  He is working a lot, no problem so far.  He has been abstinent from alcohol for over 2-1/2 years now.    Feels like his chronic medical problems are well managed.  Blood pressure is as recorded for today, on target.  Feels like his depression/anxiety are well managed on current medications.    Patient's problem list, allergies, and medications were reviewed and updated as appropriate.  Please see the EHR for full details.    Exam and data reviewed:  Patient appears well on video.  Speaking and breathing comfortably.  No evidence of breathlessness or cough.    Recent Results (from the past 336 hour(s))   Hemoglobin A1c    Collection Time: 03/25/19  1:59 PM   Result Value Ref Range    Hemoglobin A1C 6.1 (H) %   Lipid Panel (Reflex to Direct  LDL if Triglycerides more than 400)    Collection Time: 03/25/19  1:59 PM   Result Value Ref Range    Cholesterol 134 mg/dL    Triglycerides 102 mg/dL    HDL 44 40 - 60 mg/dL    LDL Calculated 70 mg/dL    Non HDL Cholesterol 90 mg/dL    Chol/HDL Ratio 3.0    CBC    Collection Time: 03/25/19  1:59 PM   Result Value Ref Range    WBC 7.7 4.2 - 9.1 THOU/uL    RBC 5.7 4.6 - 6.1 MIL/uL    Hemoglobin 14.7 13.7 - 17.5 g/dL    Hematocrit 47 40 - 51 %    MCV 83 79 - 92 fL    MCH 26 26 - 32 pg/cell    MCHC 31 (L) 32 - 37 g/dL    RDW 12.9 11.6 - 14.4 %    Platelets 215 150 - 330 THOU/uL   Basic metabolic panel    Collection Time: 03/25/19   1:59 PM   Result Value Ref Range    Glucose 99 60 - 99 mg/dL    Sodium 140 133 - 145 mmol/L    Potassium 4.4 3.3 - 5.1 mmol/L    Chloride 102 96 - 108 mmol/L    CO2 26 20 - 28 mmol/L    Anion Gap 12 7 - 16    UN 20 6 - 20 mg/dL    Creatinine 1.04 0.67 - 1.17 mg/dL    GFR,Caucasian 78 *    GFR,Black 90 *    Calcium 10.0 8.6 - 10.2 mg/dL          Assessment & Plan:  Hypertension-blood pressure is on target, continue current medications, daily physical activity, healthy diet.  Plan annual follow-up.    Impaired fasting glucose-blood sugar is little bit above, but medications are not indicated at this point.  He should continue his lifestyle management.  We will follow labs annually  for now.    History of depression-feels like his symptoms are well controlled on current medication.  I do not recommend any changes for now.    Planning annual follow-up.  He knows to contact the office in the interim if he has any new concerns.    Consent was obtained from the patient to complete this video visit; including the potential for financial liability.    Penni Bombard, MD

## 2019-03-31 ENCOUNTER — Encounter: Payer: Self-pay | Admitting: Sports Medicine

## 2019-03-31 ENCOUNTER — Ambulatory Visit: Payer: BLUE CROSS/BLUE SHIELD | Admitting: Sports Medicine

## 2019-03-31 VITALS — BP 141/77 | HR 51 | Ht 70.0 in | Wt 175.0 lb

## 2019-03-31 DIAGNOSIS — M170 Bilateral primary osteoarthritis of knee: Secondary | ICD-10-CM

## 2019-03-31 MED ORDER — LIDOCAINE HCL 1 % IJ SOLN *I*
1.0000 mL | Freq: Once | INTRAMUSCULAR | Status: AC | PRN
Start: 2019-03-31 — End: 2019-03-31
  Administered 2019-03-31: 10:00:00 1 mL via INTRA_ARTICULAR

## 2019-03-31 MED ORDER — LIDOCAINE HCL 1 % IJ SOLN *I*
1.5000 mL | Freq: Once | INTRAMUSCULAR | Status: AC | PRN
Start: 2019-03-31 — End: 2019-03-31
  Administered 2019-03-31: 10:00:00 1.5 mL via INTRA_ARTICULAR

## 2019-03-31 MED ORDER — HYLAN 48 MG/6ML IX SOSY *I*
48.0000 mg | PREFILLED_SYRINGE | Freq: Once | INTRA_ARTICULAR | Status: AC | PRN
Start: 2019-03-31 — End: 2019-03-31
  Administered 2019-03-31: 10:00:00 48 mg via INTRA_ARTICULAR

## 2019-03-31 MED ORDER — ETHYL CHLORIDE EX AERO (MULTIPLE PATIENTS) *I*
1.0000 | INHALATION_SPRAY | Freq: Once | CUTANEOUS | Status: AC | PRN
Start: 2019-03-31 — End: 2019-03-31
  Administered 2019-03-31: 10:00:00 1 via TOPICAL

## 2019-03-31 NOTE — Patient Instructions (Signed)
Injection Instructions    · The injection you received today may take 5-7 days to work in the case of cortisone or 3 weeks or more for hyaluronic acid viscosupplements (Synvisc, Hyalgan, Orthovisc or others).   · You may find the area that was injected to be more painful for 1 or 2 days after the injection, after the numbing medicine wears off.  This happens as the medicine is being absorbed in your body.  · Rest for 2-3 days, activities of daily living are fine.   · It may be helpful to put ice on the body part that was injected to ease the pain and rest the joint for today.  · You may also use pain medication - Tylenol, Advil/Motrin or Aleve as appropriate.  · A cortisone injection may cause systemic reactions, including flushing, feeling hot, irritability or inability to sleep.  · If you are diabetic, a cortisone injection can increase your blood sugar level for several days.  Monitor your glucose levels closely.  · Contact the office [phone] if you feel ill, have excessive pain, if the area turns red or you have a fever

## 2019-03-31 NOTE — Procedures (Signed)
Large Joint Aspiration/Injection Procedure: R knee intra - articular    Date/Time: 03/31/2019  9:45 AM EST  Consent given by: patient  Site marked: site marked  Timeout: Immediately prior to procedure a time out was called to verify the correct patient, procedure, equipment, support staff and site/side marked as required     Procedure Details    Location: knee - R knee intra - articular  Preparation: The site was prepped using the usual aseptic technique.  Anesthetics administered: 1 spray ethyl chloride; 1 mL lidocaine hcl 1 %; 1 mL lidocaine hcl 1 %; 1.5 mL lidocaine hcl 1 %  Viscosupplementation & other medications administered: 48 mg hylan 48 MG/6ML  Dressing:  A dry, sterile dressing was applied.  Patient tolerance: patient tolerated the procedure well with no immediate complications

## 2019-03-31 NOTE — Procedures (Signed)
Large Joint Aspiration/Injection Procedure: L knee intra - articular    Date/Time: 03/31/2019  9:45 AM EST  Consent given by: patient  Site marked: site marked  Timeout: Immediately prior to procedure a time out was called to verify the correct patient, procedure, equipment, support staff and site/side marked as required     Procedure Details    Location: knee - L knee intra - articular  Preparation: The site was prepped using the usual aseptic technique.  Anesthetics administered: 1 spray ethyl chloride; 1.5 mL lidocaine hcl 1 %; 1 mL lidocaine hcl 1 %; 1 mL lidocaine hcl 1 %  Viscosupplementation & other medications administered: 48 mg hylan 48 MG/6ML  Dressing:  A dry, sterile dressing was applied.  Patient tolerance: patient tolerated the procedure well with no immediate complications

## 2019-05-16 ENCOUNTER — Encounter: Payer: Self-pay | Admitting: Primary Care

## 2019-06-14 NOTE — Telephone Encounter (Signed)
Mr. Georger called to schedule a procedure.  Was procedure scheduled?: yes If no, reason procedure not scheduled:       1. Are you on any blood thinners? If yes, who prescribes them and what is their phone number? no  2. Are you diabetic? no  3. Do you have Kidney Failure or are you on Kidney Dialysis? no  4. Have you ever been diagnosed with cirrhosis of the liver or had banding for esophageal varices? no  5. Do you have an automated defibrillator? no  6. Do you have an LVAD? no  7. Do you have a tracheostomy? no  8. Do you use BIPAP or oxygen at home? no  9. Is the patients weight greater than 350lbs? no      Procedure Type: COD  Procedure Location: [facility]  Date: 6/1  Arrival time: 10:30am  Dr. Purvis Kilts      COVID Testing Dates: 5/27  COVID Testing Site Name: [facility]  Patient verbalized understanding that COVID Testing at a [facility] Pre-Procedure Testing Site is required or procedure will be postponed.       Patient would like prep instructions sent via (mail, MyChart, or email): mychart    Patient can be reached at the following number if necessary: [phone]

## 2019-06-14 NOTE — Telephone Encounter (Signed)
Prep sent via mychart.

## 2019-06-23 ENCOUNTER — Other Ambulatory Visit: Payer: Self-pay | Admitting: Primary Care

## 2019-06-23 NOTE — Telephone Encounter (Signed)
Last appointment: 03/28/19   Next appointment: 0  Labs done 03/25/2019

## 2019-07-06 ENCOUNTER — Other Ambulatory Visit: Payer: Self-pay | Admitting: Primary Care

## 2019-07-06 NOTE — Telephone Encounter (Signed)
LOV  12.21.20  NOV  LABS  12.18.20

## 2019-07-25 ENCOUNTER — Other Ambulatory Visit: Payer: Self-pay | Admitting: Primary Care

## 2019-08-05 ENCOUNTER — Other Ambulatory Visit: Payer: BLUE CROSS/BLUE SHIELD | Admitting: Oncology

## 2019-08-09 ENCOUNTER — Ambulatory Visit: Payer: BLUE CROSS/BLUE SHIELD | Admitting: Sports Medicine

## 2019-08-09 ENCOUNTER — Encounter: Payer: Self-pay | Admitting: Sports Medicine

## 2019-08-09 VITALS — BP 113/64

## 2019-08-09 DIAGNOSIS — M170 Bilateral primary osteoarthritis of knee: Secondary | ICD-10-CM

## 2019-08-09 MED ORDER — BETAMETHASONE ACET & SOD PHOS 6 (3-3) MG/ML IJ SUSP *I*
12.0000 mg | Freq: Once | INTRAMUSCULAR | Status: AC | PRN
Start: 2019-08-09 — End: 2019-08-09
  Administered 2019-08-09: 14:00:00 12 mg via INTRA_ARTICULAR

## 2019-08-09 MED ORDER — LIDOCAINE HCL 1 % IJ SOLN *I*
1.0000 mL | Freq: Once | INTRAMUSCULAR | Status: AC | PRN
Start: 2019-08-09 — End: 2019-08-09
  Administered 2019-08-09: 14:00:00 1 mL via INTRA_ARTICULAR

## 2019-08-09 MED ORDER — LIDOCAINE HCL 1 % IJ SOLN *I*
1.5000 mL | Freq: Once | INTRAMUSCULAR | Status: AC | PRN
Start: 2019-08-09 — End: 2019-08-09
  Administered 2019-08-09: 14:00:00 1.5 mL via INTRA_ARTICULAR

## 2019-08-09 MED ORDER — ETHYL CHLORIDE EX AERO (MULTIPLE PATIENTS) *I*
1.0000 | INHALATION_SPRAY | Freq: Once | CUTANEOUS | Status: AC | PRN
Start: 2019-08-09 — End: 2019-08-09
  Administered 2019-08-09: 14:00:00 1 via TOPICAL

## 2019-08-09 NOTE — Procedures (Signed)
Large Joint Aspiration/Injection Procedure: L knee intra - articular    Date/Time: 08/09/2019  2:00 PM EDT  Consent given by: patient  Site marked: site marked  Timeout: Immediately prior to procedure a time out was called to verify the correct patient, procedure, equipment, support staff and site/side marked as required     Procedure Details    Location: knee - L knee intra - articular  Preparation: The site was prepped using the usual aseptic technique.  Anesthetics administered: 1 spray ethyl chloride; 1 mL lidocaine hcl 1 %; 1.5 mL lidocaine hcl 1 %; 1 mL lidocaine hcl 1 %  Intra-Articular Steroids administered: 12 mg betamethasone acetate & sodium phosphate 6 (3-3) MG/ML  Dressing:  A dry, sterile dressing was applied.  Patient tolerance: patient tolerated the procedure well with no immediate complications

## 2019-08-09 NOTE — Patient Instructions (Addendum)
Injection Instructions    · The injection you received today may take 5-7 days to work in the case of cortisone or 3 weeks or more for hyaluronic acid viscosupplements (Synvisc, Hyalgan, Orthovisc or others).   · You may find the area that was injected to be more painful for 1 or 2 days after the injection, after the numbing medicine wears off.  This happens as the medicine is being absorbed in your body.  · Rest for 2-3 days, activities of daily living are fine.   · It may be helpful to put ice on the body part that was injected to ease the pain and rest the joint for today.  · You may also use pain medication - Tylenol, Advil/Motrin or Aleve as appropriate.  · A cortisone injection may cause systemic reactions, including flushing, feeling hot, irritability or inability to sleep.  · If you are diabetic, a cortisone injection can increase your blood sugar level for several days.  Monitor your glucose levels closely.  · Contact the office [phone] if you feel ill, have excessive pain, if the area turns red or you have a fever    Patient's Guide for Orthobiologic Injection    Patient's Guide for PRP/ACP and BMC-MSC    Arthrex ACP (Autologous Conditioned Plasma, a first generation technology) and Arthrex Angel PRP (second generation) are systems used to deliver PRP (Platelet Rich Plasma). Arthrex Angel BMC (bone marrow concentrate) MSC (mesenchymal stem cells) is a similar technology.  This technology is being used for many different regenerative purposes and is used in sports medicine for tendonitis, osteoarthritis as well as acute sprains and strains. PRP has shown to invoke a faster healing response when injected into the site of injury.  BMC has been shown to do this as well and has some preliminary evidence that it may also stimulate cartilage healing.   It works by enhancing collagen production, recruiting other cells involved in the healing process and increasing blood flow to promote faster healing.  This is a  safe alternative to multiple or failed steroid injections, especially for diabetics where cortisone would worse blood sugar control. These are your own cells so the risks are minimal to none.  The only significant risks are infection (<1 in 50,000) and pain.      In arthritis, it works in the joint by decreasing inflammation and stimulating the host cells to produce hyaluronic acid  (the same material as Hyalgan, Orthovisc or Synvisc). As joints become arthritic, the normal joint fluid loses its viscosity and becomes more watery. Increasing hyaluronic acid production by the joint lining cells will have the effect of creating a better lubrication in the joint and there should be less friction on the surfaces and this should translate into greater endurance for activity.   For ligament, muscle and tendon problems, evidence suggests that it exerts a direct healing effect on the host cells and may attract the body's own stem cells to the area to stimulate healing. PRP has been shown to stimulate healing of muscle, tendon and ligament.  BMC-MSC has also been shown to stimulate cartilage growth.      Your own blood is drawn in the same manner as a typical blood test and then separated by the Arthrex ACP or Angel machine to collect the PRP.  For BMC-MSC, bone marrow must be collected from either the shin bone or wing bone of your pelvis.  The centrifugation process isolates either the platelets (from blood) which contain growth factors   or the stem cells (from bone marrow) that are responsible for soft tissue healing and joint health.      Despite promising medical evidence and well over a decade of safe use in the [place], this type of injection is considered off-label and is NOT covered by insurance and therefore will be need to be paid for out of pocket.  No guarantees of efficacy are promised or implied.      You should check with your insurance to see if this out of pocket expense will apply toward your  deductible or health savings account.  The current prices are $600 per injection for ACP and $1200 per injection for Angel PRP.  BMC-MSC is $3000 due to much more extensive cost of the kit and the time needed to perform a bone marrow aspirate.  Most problems require 2 - 3 ACP injections (done once every two weeks) or a single Angel PRP or BMC injection.  It is your choice as to which system you would like to try.  We recommend Angel PRP for most problems, although for larger injuries and more extensive amounts of arthritis, BMC may be better. Injections may be safely repeated if beneficial initially and needed for a later time.  Prices are subject to change without notice prior to payment per [facility] policies.  The fee covers the cost of the PRP kit, the preparation time by the nurse or technician, general office and medical overhead and the cost of ultrasound guidance and the  injection itself performed by Dr Karipidis Pouria or her designee under direct supervision.  Office consultation visits and followup visits are billed under usual medical insurance. We will need to collect payment, by form of credit card or check before you schedule the injection. For payment information, you can contact Crystal Gibbons at [phone].  The Billing Office will then email Dr. Karipidis Pouria's office to have them set up your appointment once they have received your payment.    It is extremely important to follow appropriate post injection care and rehabilitation to achieve maximal results.  This is often done as supervised physical therapy.  You may need a brace, sling, boot or crutches following some procedures for a short time.  Too much activity undertaken too early may negate the effects of the injection.      Please read the below instructions regarding your upcoming injection    Patients Instructions  · You might experience increased pain at the injection site for about a week after your  injection. This is a normal response to the injection.  · Despite the increased pain; it is recommended for maximal benefit that you minimize your anti-inflammatory intake for one week PRIOR to the injection and one week AFTER the injection. Examples of anti-inflammatories are: Ibuprofen, Advil, Motrin, Aleve, Aspirin, Mobic/Meloxicam or Celebrex. Some studies indicate that anti-inflammatory use can decrease the efficacy of the injection.  · It would be acceptable to use any type of bracing, if this provides you relief.  · You can use Tylenol with no limitations.

## 2019-08-09 NOTE — Procedures (Signed)
Large Joint Aspiration/Injection Procedure: R knee intra - articular    Date/Time: 08/09/2019  2:00 PM EDT  Consent given by: patient  Site marked: site marked  Timeout: Immediately prior to procedure a time out was called to verify the correct patient, procedure, equipment, support staff and site/side marked as required     Procedure Details    Location: knee - R knee intra - articular  Preparation: The site was prepped using the usual aseptic technique.  Anesthetics administered: 1 spray ethyl chloride; 1 mL lidocaine hcl 1 %; 1.5 mL lidocaine hcl 1 %; 1 mL lidocaine hcl 1 %  Intra-Articular Steroids administered: 12 mg betamethasone acetate & sodium phosphate 6 (3-3) MG/ML  Dressing:  A dry, sterile dressing was applied.  Patient tolerance: patient tolerated the procedure well with no immediate complications

## 2019-08-10 ENCOUNTER — Telehealth: Payer: Self-pay

## 2019-08-10 NOTE — Telephone Encounter (Signed)
Copied from [place] [id number]. Topic: Appointments - Schedule Appointment  >> Aug 10, 2019  2:50 PM Huel Cote wrote:  Mr. Lance Peters called to schedule a procedure.  Was procedure scheduled?: yes If no, reason procedure not scheduled:       Are you on any blood thinners? If yes, who prescribes them and what is their phone number? no  Are you diabetic? no  Do you have Kidney Failure or are you on Kidney Dialysis? no  Have you ever been diagnosed with cirrhosis of the liver or had banding for esophageal varices? no  Do you have an automated defibrillator? no  Do you have an LVAD? no  Do you have a tracheostomy? no  Do you use BIPAP or oxygen at home? no  Is the patients weight greater than 350lbs? no    If any of the answers to these questions change prior to your procedure, please give the office a call to alert [place].    Procedure Type: COD  Procedure Location: [facility]  Date: 01/03/2020  Arrival time: 8 AM   Dr. Foye Spurling      COVID Testing Dates: 12/29/2019  COVID Testing Site Name: [facility]  Patient verbalized understanding that COVID Testing at a [facility] Pre-Procedure Testing Site is required or procedure will be postponed.       Patient would like prep instructions sent via (mail, MyChart, or email): mychart    Patient can be reached at the following number if necessary: [phone]

## 2019-08-11 ENCOUNTER — Encounter: Payer: Self-pay | Admitting: Gastroenterology

## 2019-09-06 ENCOUNTER — Other Ambulatory Visit: Payer: BLUE CROSS/BLUE SHIELD | Admitting: Gastroenterology

## 2019-09-13 ENCOUNTER — Encounter: Payer: Self-pay | Admitting: Sports Medicine

## 2019-09-13 ENCOUNTER — Ambulatory Visit: Payer: BLUE CROSS/BLUE SHIELD | Admitting: Sports Medicine

## 2019-09-13 VITALS — BP 130/75 | HR 51 | Ht 70.0 in | Wt 175.0 lb

## 2019-09-13 DIAGNOSIS — M170 Bilateral primary osteoarthritis of knee: Secondary | ICD-10-CM

## 2019-09-13 NOTE — Progress Notes (Signed)
Chief Complaint   Patient presents with    Right Knee - New Patient Visit    Left Knee - New Patient Visit       History:  Lance Peters is a 59 y.o. male who presents today with bilateral knee pain.  He was previously a patient of Dr. Eula Fried.  Bilateral chronic knee pain secondary to moderate osteoarthritis has previously been managed by viscosupplementation and cortisone injections, and he feels that pain has been well controlled overall.  He does say that the gel injections seem to wear off around the fourth month, which is when he usually has a cortisone injection which helped him over until the next gel shot.  He says that pain continues to be worse in the right knee.  He does have a history of arthroscopy in this knee.  He says that pain is worse going downstairs or when walking on an incline, but generally is not bothersome at rest or on a flat road.  There is some mild swelling of the right knee.  He is aware that at some point he will likely be a candidate for knee arthroplasty but is hoping that these injections can hold them off for a while longer.    Treatments:  Cortisone injections  PT  Viscosupplementation bilaterally  Knee arthroscopy on right knee      Review of Systems:   No fevers, chills, sweats  No paresthesias/weakness       Pain    09/13/19 1418   PainSc:   5   PainLoc: Knee       Past medical history, past surgical history, medications, allergies, family history, social history, and review of systems were reviewed today and have been documented separately in this encounter.     Past Medical History:   Diagnosis Date    Arthritis     elbows and shoulders     Chest pain, unspecified     Depression 05/23/2014    Neuromuscular disorder     elbows, shoulders    Unspecified essential hypertension 06/02/2013         Past Surgical History:   Procedure Laterality Date    HAND SURGERY      trigger finger release     PR ARTHRS KNE SURG W/MENISCECTOMY MED/LAT W/SHVG Right 07/16/2016     Procedure: KNEE ARTHROSCOPY WITH PARTIAL MEDIAL MENISCECTOMY;  Surgeon: Jannifer Franklin, MD;  Location: [facility];  Service: Orthopedics       Current Outpatient Medications   Medication    sertraline (ZOLOFT) 50 MG tablet    atorvastatin (LIPITOR) 20 MG tablet    piroxicam (FELDENE) 20 MG capsule    benazepril (LOTENSIN) 10 MG tablet    amLODIPine (NORVASC) 10 MG tablet    EPINEPHrine 0.3 mg/0.3 mL auto-injector    acetaminophen (TYLENOL) 325 MG tablet    aspirin 81 MG tablet    cyclobenzaprine (FLEXERIL) 10 MG tablet     No current facility-administered medications for this visit.       Objective:  Vitals:    09/13/19 1418   BP: 130/75   Pulse: 51   Weight: 79.4 kg (175 lb)   Height: 1.778 m (5\' 10" )       Physical Examination:  Constitutional: well appearing in no apparent distress  Knees: There is a trace effusion and swelling of the right knee compared to the left which appears normal.  There is no warmth or redness of either knee.  There is tenderness along the  joint lines bilaterally.  There is range of motion from 0 to 110 degrees on the right and 0 to 120 degrees on the left.  There is crepitus.  Able ligamentous testing of the left knee, there is some slight instability with varus stress of the right knee.      Imaging:  Imaging studies were personally reviewed by me and demonstrate the following:  X-rays of bilateral knees including 4 views of each from February 03, 2019 show moderate joint space narrowing of the right medial compartment and mild narrowing of the left medial compartment consistent with degenerative changes of the knees.        Assessment/Plan:    ICD-10-CM ICD-9-CM   1. Primary osteoarthritis of both knees  M17.0 96.78     59 year old male presenting for evaluation of moderate knee osteoarthritis, right worse than left.  Pain is overall controlled fairly well with routine viscosupplementation every 6 months and intermittent cortisone injections as needed.  At this time we will  proceed with prior authorization for repeat Synvisc 1 injections bilaterally.  I have also placed a referral for him to return to physical therapy.  He will follow-up pending approval of gel shots.      Gonzella Lex, D.O.  Assistant Professor of [employer]

## 2019-09-20 NOTE — Telephone Encounter (Signed)
Prep sent to my chart

## 2019-09-26 ENCOUNTER — Ambulatory Visit: Payer: BLUE CROSS/BLUE SHIELD | Admitting: Sports Medicine

## 2019-09-26 VITALS — BP 118/66 | Ht 70.0 in | Wt 175.0 lb

## 2019-09-26 DIAGNOSIS — M170 Bilateral primary osteoarthritis of knee: Secondary | ICD-10-CM

## 2019-09-26 MED ORDER — LIDOCAINE HCL 1 % IJ SOLN *I*
0.5000 mL | Freq: Once | INTRAMUSCULAR | Status: AC | PRN
Start: 2019-09-26 — End: 2019-09-26
  Administered 2019-09-26: .5 mL via INTRA_ARTICULAR

## 2019-09-26 MED ORDER — LIDOCAINE HCL 1 % IJ SOLN *I*
1.5000 mL | Freq: Once | INTRAMUSCULAR | Status: AC | PRN
Start: 2019-09-26 — End: 2019-09-26
  Administered 2019-09-26: 1.5 mL via INTRA_ARTICULAR

## 2019-09-26 MED ORDER — LIDOCAINE HCL 1 % IJ SOLN *I*
1.0000 mL | Freq: Once | INTRAMUSCULAR | Status: AC | PRN
Start: 2019-09-26 — End: 2019-09-26
  Administered 2019-09-26: 1 mL via INTRA_ARTICULAR

## 2019-09-26 MED ORDER — HYLAN 48 MG/6ML IX SOSY *I*
48.0000 mg | PREFILLED_SYRINGE | Freq: Once | INTRA_ARTICULAR | Status: AC | PRN
Start: 2019-09-26 — End: 2019-09-26
  Administered 2019-09-26: 48 mg via INTRA_ARTICULAR

## 2019-09-26 NOTE — Procedures (Signed)
Large Joint Aspiration/Injection Procedure: L knee intra - articular    Date/Time: 09/26/2019 11:40 AM EDT  Consent given by: patient  Site marked: site marked  Timeout: Immediately prior to procedure a time out was called to verify the correct patient, procedure, equipment, support staff and site/side marked as required     Procedure Details    Location: knee - L knee intra - articular  Preparation: The site was prepped using the usual aseptic technique.  Ultrasound guidance:  Ultrasound was utilized to improve needle visualization, injection accuracy, and anatomic localization.    Anesthetics administered: 1 mL lidocaine hcl 1 %; 1.5 mL lidocaine hcl 1 %; 0.5 mL lidocaine hcl 1 %  Viscosupplementation & other medications administered: 48 mg hylan 48 MG/6ML  Dressing:  A dry, sterile dressing was applied.  Patient tolerance: patient tolerated the procedure well with no immediate complications

## 2019-09-26 NOTE — Procedures (Signed)
Large Joint Aspiration/Injection Procedure: R knee intra - articular    Date/Time: 09/26/2019 11:40 AM EDT  Consent given by: patient  Site marked: site marked  Timeout: Immediately prior to procedure a time out was called to verify the correct patient, procedure, equipment, support staff and site/side marked as required     Procedure Details    Location: knee - R knee intra - articular  Preparation: The site was prepped using the usual aseptic technique.  Ultrasound guidance:  Ultrasound was utilized to improve needle visualization, injection accuracy, and anatomic localization.    Anesthetics administered: 1 mL lidocaine hcl 1 %; 1.5 mL lidocaine hcl 1 %; 0.5 mL lidocaine hcl 1 %  Viscosupplementation & other medications administered: 48 mg hylan 48 MG/6ML  Dressing:  A dry, sterile dressing was applied.  Patient tolerance: patient tolerated the procedure well with no immediate complications      Patient Name: Lance Peters  MRN: [id number]  Date: 09/26/2019  Procedure : Ultrasound guided injection  Laterality:bilateral  Body part: Knee  Examiner : Gonzella Lex, DO  Equipment : [facility].   Indication : Needle guidance for procedure    Technique: The patient is placed in a supine position.  A bolster is placed behind the knee.  The site was appropriately marked, and a time-out was performed prior to the procedure.  The skin is prepped with chlorhexadine in the usual fashion. Linear transducer is prepped with chlorhexadine and utilized to visualize the anterior knee.  Linear transducer was used to visualize the joint via a lateral window with sterile ultrasound gel. Ultrasound imagery of the injection site in long and short axes were obtained with the [facility] and appropriated images were labeled, saved and permanently archived.    Findings : A limited diagnostic scan of the joint was done. Minimal fluid collection was seen.     Procedure: Risks, benefits, and alternatives to injection  were discussed with the patient. Verbal informed consent obtained.  Pre-Procedure Check was done.  The injection is done on the bilateral.  Local anesthetic with 1% lidocine 3 mL was given.  Using ultrasound guidance a 20 gauge 1.5 inch needle was inserted through the skin and into the suprapatellar pouch via a superior-lateral portal with avoidance of bony prominences, blood vessels and other vulnerable structures.  Needle tip joint space verification was done with the ultrasound then 1 ampule of Synvisc-1 was injected, slowly but with consistent pressure, with free flow of fluid into the joint visualized.  Joint distention visualized.  The needle is removed, skin is wiped with alcohol, hemostasis is achieved and a bandage is applied.  The patient tolerated the procedure well without difficulty.  Warning signs and routine aftercare are discussed with the patient.      Impression:  Ultrasound-guided injection for bilateral knees for knee osteoarthritis

## 2019-09-28 ENCOUNTER — Ambulatory Visit: Payer: BLUE CROSS/BLUE SHIELD | Admitting: Rehabilitative and Restorative Service Providers"

## 2019-10-12 ENCOUNTER — Ambulatory Visit: Payer: BLUE CROSS/BLUE SHIELD | Admitting: Rehabilitative and Restorative Service Providers"

## 2019-10-12 ENCOUNTER — Ambulatory Visit: Payer: BLUE CROSS/BLUE SHIELD | Admitting: Sports Medicine

## 2019-11-02 ENCOUNTER — Encounter: Payer: Self-pay | Admitting: Primary Care

## 2019-11-02 MED ORDER — PREDNISONE 20 MG PO TABS *I*
40.0000 mg | ORAL_TABLET | Freq: Every day | ORAL | 2 refills | Status: AC | PRN
Start: 2019-11-02 — End: 2019-11-07

## 2019-11-02 NOTE — Telephone Encounter (Signed)
done

## 2019-11-02 NOTE — Telephone Encounter (Signed)
LOV 09/17/19    No future appts

## 2019-12-06 ENCOUNTER — Other Ambulatory Visit: Payer: Self-pay | Admitting: Primary Care

## 2019-12-06 NOTE — Telephone Encounter (Signed)
lov 03/28/19  No fuv  Labs 03/25/19, future ordered

## 2019-12-15 ENCOUNTER — Telehealth: Payer: Self-pay

## 2019-12-15 NOTE — Telephone Encounter (Signed)
Copied from [place] [id number]. Topic: Appointments - Appointment Information  >> Dec 15, 2019  3:34 PM Anne Shutter wrote:  Patient says he needs prep instructions sent to his mychart and he has some .questions regarding the medication he is taking. He wants to know if he has to stop taking meds. Patient says they are anti inflammatory meds,not blood thinners . Patient can be reached at [phone]

## 2019-12-16 NOTE — Telephone Encounter (Signed)
Returned patient's call regarding prep instructions. Informed patient they were sent to his mychart account. Patient asking if he should stop his piroxicam. Informed patient that unless he is taking blood thinner or diabetic medications, he should continue to take all usual medications on regular schedule unless otherwise instructed. Patient verbalized understanding and appreciative of call. No further questions at this time.

## 2019-12-20 ENCOUNTER — Other Ambulatory Visit: Payer: Self-pay

## 2019-12-20 DIAGNOSIS — Z01812 Encounter for preprocedural laboratory examination: Secondary | ICD-10-CM

## 2019-12-22 ENCOUNTER — Telehealth: Payer: Self-pay

## 2019-12-22 NOTE — Telephone Encounter (Signed)
Spoke with patient and discussed the following  items prior to their COD procedure on 01/03/20 with  Dr. Foye Spurling .      Has the patient received their prep instructions? Yes    Has the patient discussed how to proceed with any prescribed blood thinning medications excluding Aspirin with a nurse, if applicable? No Comment: N/A       Have you recently developed any of the following symptoms without a known cause: NO   Body aches   Congestion/runny nose   Cough   Difficulty breathing   Fever   Loss of taste/smell   Severe fatigue   Severe migraine   Shaking chills   Sore throat    Have you been told to quarantine at home due to exposure to COVID 19 in the last 14 days? NO    Have you traveled internationally in the last 10 days?NO    Have you had a positive result to a COVID PCR in the last 10 days? NO    PATIENT WAS INFORMED A NEGATIVE COVID RESULT MUST BE OBTAINED IN ORDER TO PROCEED WITH SCHEDULED PROCEDURE.

## 2019-12-23 ENCOUNTER — Telehealth: Payer: Self-pay | Admitting: Primary Care

## 2019-12-23 ENCOUNTER — Ambulatory Visit
Admission: AD | Admit: 2019-12-23 | Discharge: 2019-12-23 | Disposition: A | Discharge status: Home / Self Care | Payer: BLUE CROSS/BLUE SHIELD | Source: Ambulatory Visit | Attending: Family | Admitting: Family

## 2019-12-23 DIAGNOSIS — Z20822 Contact with and (suspected) exposure to covid-19: Secondary | ICD-10-CM | POA: Insufficient documentation

## 2019-12-23 DIAGNOSIS — J3489 Other specified disorders of nose and nasal sinuses: Secondary | ICD-10-CM | POA: Insufficient documentation

## 2019-12-23 DIAGNOSIS — U071 COVID-19: Secondary | ICD-10-CM | POA: Insufficient documentation

## 2019-12-23 DIAGNOSIS — R0981 Nasal congestion: Secondary | ICD-10-CM | POA: Insufficient documentation

## 2019-12-23 NOTE — UC Provider Note (Signed)
Chief Complaint:   Chief Complaint   Patient presents with    COVID-19 Concern        HPI:  59 y.o. male w hx as noted in chart presents to Urgent Care with symptoms concerning for COVID 19. Patient complains of2 day(s) of the following symptoms: Nasal congestion with rhinorrhea, myalgias and headache  Symptoms have remained the same     Known sick contacts / COVID 19 exposures: yes  Recent travel: no  COVID 19 vaccination completed >2 weeks ago: yes    VITALS:  BP 119/75 (BP Location: Left arm)    Pulse 69    Temp 36.6 C (97.9 F) (Temporal)    Resp 18    SpO2 95%      ROS: see above / below, otherwise complete ROS negative   Fever: no  Body aches: yes  Upper respiratory congestion: yes  Shortness of breath: no  Cough: no     PHYSICAL EXAM:  Appearance: Well appearing, no acute distress   Nose: mucosal edema and congestion   Throat: normal  Neck: no cervical LAD or tenderness, soft  CV: RRR, no murmurs   Pulm: Lungs Clear to ausculation bilaterally, no acute respiratory distress     UC LABS:  Labs Reviewed   COVID-19 PCR      MDM:  59 y.o. male w hx as noted in chart presents with symptoms concerning for COVID-19 vs other mild viral illness. COVID 19 PCR testing ordered (see above if other labs ordered). The patient was informed the COVID 19 result will be available in MyChart and the need for isolation per CDC guidelines. Recommend supportive care at this time. Advised close follow up w PCP as needed. Appropriate Handouts provided.     Given the patient's reassuring vital signs and overall well appearance, the patient can be managed safely at home. Hydration advised. Patient advised to go to ED for any worsening or concerning symptoms.        Devin Going, NP  12/23/19 1300

## 2019-12-23 NOTE — ED Triage Notes (Signed)
Pt. Presents to UC with c/o body aches, headache, nasal congestion, diarrhea x 1 day. Pt. Reports covid vaccinated.        Triage Note   Virgel Gess, RN

## 2019-12-23 NOTE — Telephone Encounter (Signed)
Pt calling to get a COVID test, he called into work today with complaints of runny nose, cough, congestion, he feels it is allergies, does not need to be seen for these symptoms just wants to rule out COVID. Please advise.

## 2019-12-23 NOTE — Telephone Encounter (Signed)
Lance Peters is agreeable to swabbing this pt @ 4:40 (arrival time 4:15), okay with you??

## 2019-12-23 NOTE — Telephone Encounter (Signed)
Ok - thanks Mickel Baas

## 2019-12-23 NOTE — Telephone Encounter (Signed)
Pt now wants to be evaluated for his symptoms, per Mickel Baas, pt advised to go to UC.

## 2019-12-24 ENCOUNTER — Telehealth: Payer: Self-pay | Admitting: Emergency Medicine

## 2019-12-24 LAB — COVID-19 PCR

## 2019-12-24 LAB — COVID-19 NAAT (PCR): COVID-19 NAAT (PCR): POSITIVE — AB

## 2019-12-24 NOTE — Telephone Encounter (Signed)
Contacted patient to discuss positive Covid-19 test results. Reinforced quarantine guidelines and patient made aware that the [facility] will be contacted him to assist with contact tracing.     Reviewed if symptoms worsened to make sure to follow up.     Patient with no further questions at this time.      Robyne Askew, NP  12/24/19 1449

## 2019-12-26 ENCOUNTER — Telehealth: Payer: Self-pay | Admitting: Oncology

## 2019-12-26 NOTE — Telephone Encounter (Unsigned)
Copied from [place] [id number]. Topic: Access to Care - Speak to Provider/Office Staff  >> Dec 26, 2019  8:14 AM Rise Patience, Larena Glassman wrote:  Patient is scheduled for COD 01/03/20, patient says he tested positive for covid 19 on 12/23/19, patient says the health department said he will be out of quarantine 01/01/20 but said he would test positive up to 4 weeks after his positive test. Patient says he is sick and having symptoms but they are going away. Patient can be reached at [phone]

## 2019-12-27 ENCOUNTER — Telehealth: Payer: Self-pay

## 2019-12-27 NOTE — Telephone Encounter (Signed)
Upon review of chart for navigation purposes, noted patient tested positive for COVID on 9/17 per patient. He is scheduled for COD 9/28. Provider notified.

## 2019-12-29 ENCOUNTER — Telehealth: Payer: Self-pay

## 2019-12-29 NOTE — Telephone Encounter (Signed)
Copied from [place] [id number]. Topic: Appointments - Reschedule Appointment  >> Dec 29, 2019 11:01 AM Gayland Curry T wrote:  Mr. Sortino is calling to cancel his appointment which is currently scheduled for 3 year colonoscopy.    Reason for the cancellation: patient is recovering from [place] and needs to reschedule his colonoscopy. Writer unable to reschedule he would like to schedule at [facility]     Has the appointment been cancelled? yes    Has the appointment been rescheduled? no    Does the patient need a call back to reschedule?yes    Patient can be contacted back at [phone]

## 2019-12-29 NOTE — Telephone Encounter (Signed)
Copied from [place] [id number]. Topic: Access to Care - Speak to Provider/Office Staff  >> Dec 29, 2019 10:24 AM Anne Shutter wrote:  Patient says that he will be out of quarantine before his COD but is worried that his COVID test is going to come back positive.Patient can be reached at [phone].

## 2019-12-30 NOTE — Telephone Encounter (Signed)
05/01/19 at [facility] with PT regarding scheduling of procedure, details listed below:    Are you on any blood thinners? no   If yes, who prescribes them and what is their phone number?        Are you diabetic? no    Do you have an automated defibrillator? NO    Do you have an LVAD? NO     Do you have a tracheostomy? NO    Do you use BIPAP or oxygen at home? NO    For scheduling safety precautions, is the patient's BMI under 45?     FOR ENDOSCOPY (includes CEN) [place]- Have you ever had banding for esophageal varices?     FOR COLONOSCOPY SCHEDULING ONLY-To ensure appropriate cleanse preparation, do you move your bowels daily or every other day? YES         COVID Testing is required by the [facility] within 5 days of your scheduled procedure regardless of vaccination status   COVID Testing is due on: 04/27/19  COVID Testing Site Address:    [facility]  [address] [place] [address]   Monday - Friday 7:30 a.m. - 5 p.m. Saturday - Sunday 8 a.m. - 12 p.m.   Phone: [phone]         Patient has been notified, if a Negative COVID -19 result is not received within 5 days of their procedure, the procedure is subject to cancellation.    Procedure Type: COD   Sedation Type: mod sed   Procedure Location: HH   Procedure Date:  05/01/19  Arrival time:  1:30   Dr.:  Crist Infante    Patient requested prep instructions sent via: My Chart

## 2020-01-03 ENCOUNTER — Other Ambulatory Visit: Payer: BLUE CROSS/BLUE SHIELD | Admitting: Oncology

## 2020-01-05 ENCOUNTER — Other Ambulatory Visit: Payer: Self-pay | Admitting: Primary Care

## 2020-01-05 NOTE — Telephone Encounter (Signed)
LOV  12.21.20  NOV  LABS  12.18.20

## 2020-01-09 ENCOUNTER — Ambulatory Visit: Payer: BLUE CROSS/BLUE SHIELD | Admitting: Sports Medicine

## 2020-01-09 VITALS — BP 110/68 | Ht 70.0 in | Wt 175.0 lb

## 2020-01-09 DIAGNOSIS — M25562 Pain in left knee: Secondary | ICD-10-CM

## 2020-01-09 DIAGNOSIS — M25561 Pain in right knee: Secondary | ICD-10-CM

## 2020-01-09 DIAGNOSIS — M170 Bilateral primary osteoarthritis of knee: Secondary | ICD-10-CM

## 2020-01-09 MED ORDER — LIDOCAINE HCL 1 % IJ SOLN *I*
0.5000 mL | Freq: Once | INTRAMUSCULAR | Status: AC | PRN
Start: 2020-01-09 — End: 2020-01-09
  Administered 2020-01-09: 11:00:00 .5 mL via INTRA_ARTICULAR

## 2020-01-09 MED ORDER — ETHYL CHLORIDE EX AERO (MULTIPLE PATIENTS) *I*
1.0000 | INHALATION_SPRAY | Freq: Once | CUTANEOUS | Status: AC | PRN
Start: 2020-01-09 — End: 2020-01-09
  Administered 2020-01-09: 11:00:00 1 via TOPICAL

## 2020-01-09 MED ORDER — LIDOCAINE HCL 1 % IJ SOLN *I*
1.5000 mL | Freq: Once | INTRAMUSCULAR | Status: AC | PRN
Start: 2020-01-09 — End: 2020-01-09
  Administered 2020-01-09: 11:00:00 1.5 mL via INTRA_ARTICULAR

## 2020-01-09 MED ORDER — BETAMETHASONE ACET & SOD PHOS 6 (3-3) MG/ML IJ SUSP *I*
12.0000 mg | Freq: Once | INTRAMUSCULAR | Status: AC | PRN
Start: 2020-01-09 — End: 2020-01-09
  Administered 2020-01-09: 11:00:00 12 mg via INTRA_ARTICULAR

## 2020-01-09 MED ORDER — LIDOCAINE HCL 1 % IJ SOLN *I*
1.0000 mL | Freq: Once | INTRAMUSCULAR | Status: AC | PRN
Start: 2020-01-09 — End: 2020-01-09
  Administered 2020-01-09: 11:00:00 1 mL via INTRA_ARTICULAR

## 2020-01-09 NOTE — Progress Notes (Signed)
Chief Complaint   Patient presents with    Left Knee - Pain    Right Knee - Pain    Follow-up       History:  Lance Peters is a 59 y.o. male who presents today with bilateral knee pain.  He had hyaluronic acid (Synvisc 1) injections in both knees on September 26, 2019.  He says that he thinks he had a bad reaction to these injections.  He had a significant increase in bilateral knee pain associated with swelling and some redness for about a week after.  He does say that this is happened with these injections in the past (increased discomfort) to a lesser extent.  He says this was bad enough that it made him not want to do these injections again.  He would like to stick with cortisone, and is requesting cortisone injections today.  He does say that eventually the HA injections did going to give him some pain relief, however he does still have some pain with activities including work and being on his feet.  He says that generally cortisone in between the HA shots does help relieve this.     Today he says that he has aching in both knees.  Again, pain worse with activity, improving with rest and avoidance of painful activities.  He continues to work long days.  He is not currently doing any other treatments for pain.      Treatments:  Cortisone injections-temporary pain relief  PT  Viscosupplementation bilaterally (Synvisc-1), most recently 09/2019 with partial pain relief  Knee arthroscopy on right knee      Review of Systems:   No fevers, chills, sweats  No paresthesias/weakness       Pain    01/09/20 1114 01/09/20 1115   PainSc:   5   5   PainLoc:  Knee       Past medical history, past surgical history, medications, allergies, family history, social history, and review of systems were reviewed today and have been documented separately in this encounter.     Past Medical History:   Diagnosis Date    Arthritis     elbows and shoulders     Chest pain, unspecified     Depression 05/23/2014    Neuromuscular disorder      elbows, shoulders    Unspecified essential hypertension 06/02/2013         Past Surgical History:   Procedure Laterality Date    HAND SURGERY      trigger finger release     PR ARTHRS KNE SURG W/MENISCECTOMY MED/LAT W/SHVG Right 07/16/2016    Procedure: KNEE ARTHROSCOPY WITH PARTIAL MEDIAL MENISCECTOMY;  Surgeon: Jannifer Franklin, MD;  Location: [facility];  Service: Orthopedics       Current Outpatient Medications   Medication    piroxicam (FELDENE) 20 MG capsule    amLODIPine (NORVASC) 10 MG tablet    sertraline (ZOLOFT) 50 MG tablet    atorvastatin (LIPITOR) 20 MG tablet    benazepril (LOTENSIN) 10 MG tablet    EPINEPHrine 0.3 mg/0.3 mL auto-injector    acetaminophen (TYLENOL) 325 MG tablet    aspirin 81 MG tablet     No current facility-administered medications for this visit.       Objective:  Vitals:    01/09/20 1114   BP: 110/68   Weight: 79.4 kg (175 lb)   Height: 1.778 m (5\' 10" )       Physical Examination:  Constitutional: well appearing in  no apparent distress  Knees: There is a trace effusion and swelling of the right knee compared to the left which appears normal.  There is no warmth or redness of either knee.  There is mild tenderness along the joint lines bilaterally.  There is range of motion from 0 to 110 degrees on the right and 0 to 120 degrees on the left.  There is crepitus.        Imaging:  Imaging studies were personally reviewed by me and demonstrate the following:  X-rays of bilateral knees including 4 views of each from February 03, 2019 show moderate joint space narrowing of the right medial compartment and mild narrowing of the left medial compartment consistent with degenerative changes of the knees.        Assessment/Plan:    59 year old male with chronic bilateral knee pain consistent with moderate knee osteoarthritis, right worse than left.  Reporting poor reaction to most recent Synvisc 1 injections in June.  Actually sounding like pseudoseptic reaction, which is a rare side  effect of Synvisc injections specifically, mimicking septic joint but not truly an infection, just an adverse reaction to this particular injection.  He did not see me for this problem at the time so I cannot confirm this.  However, I would recommend that we do not repeat Synvisc 1 injections going forward, and have added this to his list of "allergies" today, though again discussed this is not truly an allergy to hyaluronic acid.      ICD-10-CM ICD-9-CM   1. Primary osteoarthritis of both knees  M17.0 715.16     We discussed management options today.  He is requesting cortisone injections.  These were administered as a temporizing measure.  We discussed that going forward if he wished to try a different type of hyaluronic acid injection that does not result in pseudoseptic reaction I think this would be reasonable.  I also am recommending a 3 shot series which may be more gentle for his knee than the 1 shot series.  We will try to get authorization for Orthovisc injections to be done in December when they are due.  Generally these are very well-tolerated.  He is agreeable with this plan.    Follow-up in December if Orthovisc injections are approved.  If they are not then we will discuss other options at that time.      Gonzella Lex, D.O.  Assistant Professor of [employer]

## 2020-01-09 NOTE — Procedures (Signed)
Large Joint Aspiration/Injection Procedure: L knee intra - articular    Date/Time: 01/09/2020 11:00 AM EDT  Consent given by: patient  Site marked: site marked  Timeout: Immediately prior to procedure a time out was called to verify the correct patient, procedure, equipment, support staff and site/side marked as required     Procedure Details    Location: knee - L knee intra - articular  Preparation: The site was prepped using the usual aseptic technique.  Anesthetics administered: 1 mL lidocaine hcl 1 %; 1 spray ethyl chloride; 1.5 mL lidocaine hcl 1 %; 0.5 mL lidocaine hcl 1 %  Intra-Articular Steroids administered: 12 mg betamethasone acetate & sodium phosphate 6 (3-3) MG/ML  Dressing:  A dry, sterile dressing was applied.  Patient tolerance: patient tolerated the procedure well with no immediate complications

## 2020-01-09 NOTE — Procedures (Signed)
Large Joint Aspiration/Injection Procedure: R knee intra - articular    Date/Time: 01/09/2020 11:00 AM EDT  Consent given by: patient  Site marked: site marked  Timeout: Immediately prior to procedure a time out was called to verify the correct patient, procedure, equipment, support staff and site/side marked as required     Procedure Details    Location: knee - R knee intra - articular  Preparation: The site was prepped using the usual aseptic technique.  Anesthetics administered: 1 mL lidocaine hcl 1 %; 1 spray ethyl chloride; 1.5 mL lidocaine hcl 1 %; 0.5 mL lidocaine hcl 1 %  Intra-Articular Steroids administered: 12 mg betamethasone acetate & sodium phosphate 6 (3-3) MG/ML  Dressing:  A dry, sterile dressing was applied.  Patient tolerance: patient tolerated the procedure well with no immediate complications

## 2020-02-03 ENCOUNTER — Other Ambulatory Visit: Payer: Self-pay | Admitting: Primary Care

## 2020-02-03 NOTE — Telephone Encounter (Signed)
lov 03/28/19  No fuv  Labs 03/25/19, future ordered

## 2020-02-13 ENCOUNTER — Encounter: Payer: Self-pay | Admitting: Sports Medicine

## 2020-02-16 ENCOUNTER — Ambulatory Visit: Payer: BLUE CROSS/BLUE SHIELD | Attending: Sports Medicine | Admitting: Sports Medicine

## 2020-02-16 ENCOUNTER — Ambulatory Visit
Admission: RE | Admit: 2020-02-16 | Discharge: 2020-02-16 | Disposition: A | Discharge status: Home / Self Care | Payer: BLUE CROSS/BLUE SHIELD | Source: Ambulatory Visit

## 2020-02-16 VITALS — BP 134/76 | Wt 175.0 lb

## 2020-02-16 DIAGNOSIS — M25511 Pain in right shoulder: Secondary | ICD-10-CM | POA: Insufficient documentation

## 2020-02-16 NOTE — Progress Notes (Signed)
Chief Complaint   Patient presents with    Right Shoulder - Pain    Follow-up       History:  Lance Peters is a 59 y.o. male who presents today with right shoulder pain.      HPI:  Onset of right shoulder pain approximately 1 month ago while he was hunting and he was trying to climb a tree.  As he was pulling himself up he felt a pop in his right shoulder.  He says that he did not have pain right away, did not experience pain until about a week later when his shoulder started to hurt.  The pain is gotten progressively worse since then.  He notes the pain is at the top of the shoulder and in the anterior shoulder.  It comes and goes, sometimes there is no pain at rest.  The pain is generally worse with lifting most objects, abduction, and at night when trying to sleep.  He has not noticed any swelling or deformity.  He notes some mild weakness but has had some intermittent numbness and tingling down to the right hand.  He notes a past history of right shoulder pain years ago and says that he had a cortisone shot in the shoulder that did relieve symptoms at that time.    Treatments for this problem over the past month have included activity modification, and taking anti-inflammatories.  He takes piroxicam daily for ot actually be really nice things actually bring some then she developed some to normal intervals see her for I had some mixup at home all her aches and pains, but in addition has also been taking some ibuprofen.    He denies any history of shoulder surgery.    ROS:  No fevers, chills, sweats  No GI discomfort/gastritis  No paresthesias/weakness      Pain    02/16/20 1042 02/16/20 1043   PainSc:   5   5       Past medical history, past surgical history, medications, allergies, family history, social history, and review of systems were reviewed today and have been documented separately in this encounter.     Past Medical History:   Diagnosis Date    Arthritis     elbows and shoulders     Chest pain,  unspecified     Depression 05/23/2014    Neuromuscular disorder     elbows, shoulders    Unspecified essential hypertension 06/02/2013         Past Surgical History:   Procedure Laterality Date    HAND SURGERY      trigger finger release     PR ARTHRS KNE SURG W/MENISCECTOMY MED/LAT W/SHVG Right 07/16/2016    Procedure: KNEE ARTHROSCOPY WITH PARTIAL MEDIAL MENISCECTOMY;  Surgeon: Jannifer Franklin, MD;  Location: [facility];  Service: Orthopedics         Objective:  Vitals:    02/16/20 1042   BP: 134/76   Weight: 79.4 kg (175 lb)       Physical Examination:  Constitutional: well appearing in no apparent distress  Neurological: Awake, alert, and oriented, movement of extremities is grossly symmetric.  Vascular: Skin is warm and well-perfused.    Neck: No swelling, erythema, ecchymosis.  Nontender to palpation midline or over paravertebral muscles.  Upper extremity strength full and equal bilaterally, neurovascularly intact.    R SHOULDER: No swelling, erythema, deformity, or warmth.  Patient is tender to palpation over bicipital groove, AC joint.  Flexion to 170,  internal rotation to T12, external rotation to 65, abduction to 170.  5/5 strength with resisted biceps and triceps testing.  4+/5 strength with supraspinatus testing.  Neurovascularly intact.      Jobe, positive  Neer,  negative  Michel Bickers,  negative  ER lag sign,  negative  Drop arm test,  negative   Subscapularis lift off,  positive  Speed's,  positive  Positive crossarm test      Imaging:  Imaging studies were personally reviewed by me and demonstrate the following:  X-rays of the right shoulder performed and reviewed on February 16, 2020 including AP, [place], and lateral views show slight widening of the [facility] joint, no acute fractures.  Evidence of chronic rotator cuff disease at the greater tuberosity of the humerus.        Assessment/Plan:    ICD-10-CM ICD-9-CM   1. Right shoulder pain  M25.511 61.64     59 year old male presenting for  evaluation of acute right shoulder pain following injury.  We discussed pain likely multifactorial at this time.  I suspect there is a mild AC joint sprain/separation, biceps tendon strain/tendinitis, also some rotator cuff tendon strain/injury on overlying tendinopathy.    We discussed the relevant anatomy and pathophysiology of the shoulder injuries.  We discussed management options available at this time including medications, physical therapy, activity modification, and subacromial cortisone injection.  At this time the plan is as follows:    -Medication: He may continue with piroxicam per PCP, but I advised him not to use any additional anti-inflammatories.  He may also incorporate extra strength Tylenol as needed.  -Physical therapy - referral placed at [facility] location.  -Cortisone injection deferred at this time.    Follow up: 2 months.  At that time we will determine whether or not to proceed with advanced imaging to better characterize his injury, versus considering corticosteroid injection of either subacromial bursa or AC joint depending on his symptoms at that point.      Gonzella Lex, DO  Assistant Professor of [employer] [facility]

## 2020-03-01 ENCOUNTER — Other Ambulatory Visit: Payer: Self-pay | Admitting: Primary Care

## 2020-03-05 NOTE — Telephone Encounter (Signed)
Last appointment: 03/28/2019  Next appointment: Visit date not found  Labs: 03/25/2019

## 2020-03-05 NOTE — Telephone Encounter (Signed)
I have just refilled a prescription for chronic med(s), havent seen patient recently. Please schedule appt for followup. With fasting blood work    There are lab orders in the system already, the expire in mid January.  I need to know if he is coming in after that so I can reorder

## 2020-03-07 ENCOUNTER — Other Ambulatory Visit: Payer: Self-pay | Admitting: Primary Care

## 2020-03-07 DIAGNOSIS — I10 Essential (primary) hypertension: Secondary | ICD-10-CM

## 2020-03-07 DIAGNOSIS — R7301 Impaired fasting glucose: Secondary | ICD-10-CM

## 2020-03-07 MED ORDER — BENAZEPRIL HCL 10 MG PO TABS *I*
10.0000 mg | ORAL_TABLET | Freq: Every day | ORAL | 5 refills | Status: DC
Start: 2020-03-07 — End: 2021-06-04

## 2020-03-07 NOTE — Telephone Encounter (Addendum)
Tried to reach Mr. Pizzo: received a message that his service is currently unavailable and please try back later.

## 2020-03-07 NOTE — Telephone Encounter (Signed)
lov 03/28/19  No fuv  Labs 03/25/19, future ordered

## 2020-03-07 NOTE — Telephone Encounter (Signed)
Need 30 minute diabetes follow up. Please schedule appt for followup. With fasting blood work & urine

## 2020-03-12 ENCOUNTER — Ambulatory Visit
Payer: BLUE CROSS/BLUE SHIELD | Attending: Sports Medicine | Admitting: Rehabilitative and Restorative Service Providers"

## 2020-03-12 DIAGNOSIS — M7541 Impingement syndrome of right shoulder: Secondary | ICD-10-CM | POA: Insufficient documentation

## 2020-03-12 NOTE — Progress Notes (Signed)
PT SPORTS REHABILITATION UE EVALUATION    History  Diagnosis: Right Shoulder Pain,  Onset date:  see below, Chronic  Date of surgery: NA     Referring MD: Guy Sandifer, MD: 02/16/2020  Onset of right shoulder pain approximately 1 month ago while he was hunting and he was trying to climb a tree.  As he was pulling himself up he felt a pop in his right shoulder.  He says that he did not have pain right away, did not experience pain until about a week later when his shoulder started to hurt.  The pain is gotten progressively worse since then.  He notes the pain is at the top of the shoulder and in the anterior shoulder.  It comes and goes, sometimes there is no pain at rest.  The pain is generally worse with lifting most objects, abduction, and at night when trying to sleep.  He has not noticed any swelling or deformity.  He notes some mild weakness but has had some intermittent numbness and tingling down to the right hand.  He notes a past history of right shoulder pain years ago and says that he had a cortisone shot in the shoulder that did relieve symptoms at that time.  Treatments for this problem over the past month have included activity modification, and taking anti-inflammatories.  He takes piroxicam daily for ot actually be really nice things actually bring some then she developed some to normal intervals see her for I had some mixup at home all her aches and pains, but in addition has also been taking some ibuprofen    Referral: Right shoulder pain - suspect biceps tendonitis, possible AC sprain/mild separation, likely RC tendinopathy    Lance Peters is a 59 y.o. male who is present today for right, shoulder care.  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  Brewing technologist of job: Push, Pull, Overhead, Heavy Lifting, Awkward  Positions, Prolonged Standing, Prolonged Sitting, Fine Motor Manipulation, Filing / Cashiering, Vibration exposure, Tool Use, Repeated grasping  Stresses/physical demands of home: [place], Housekeeping and Gardening/Yard Work  Sport(s): Golf, Location manager and Walking  Diagnostic tests: Per report, reviewed, X-ray   Other: NA    Symptom location: Lateral and Anterior, right  Relevant symptoms: Aching, Pain , Decreased ROM, Decreased strength  Symptom frequency: Constant  Symptom intensity (0 - 10 scale): Now 4 Best 4 Worst 10 -carrying his grandson  Night Pain: Yes      Symptoms worsen with: Reaching overhead, Reaching to the side, Reaching behind back, Lifting, Carrying  Symptoms improve with: Rest, Medication  Assistive device:  NA  Patients goals for therapy: Perform all self care ADLs (bathing, hygiene, dressing, eating) independently, Reduce pain, Increase ROM / flexibility, Increase strength, Achieve independence with home program for self care / condition management, Return to work, Return to sports / activities     Objective:   Observation: Unremarkable  Palpation: tenderness @ localized  Cervical Screen:  Decreased / painful Active Range of Motion restricted extension and small restrictions in rotation both directions, this is not new problem for him  Neurologic:  Sensation:  UE  Light touch, Intact to gross screen  Myotomes:  UE Intact to gross screen      ROM/Strength  UE      AROM AROM PROM  PROM MMT MMT    Right Left Right Left Right Left   Shoulder          Forward Flexion 145 170   4- 4+   Extension         Abduction  145 170   4 4+   Internal Rotation T12 T8   4 5   External Rotation 50 70   4- 4+                  Special Tests:    Shoulder Neer,  positive  Michel Bickers,  positive  Speed's,  positive  O'Brien,  negative   Elbow NA   Wrist/hand NA      Functional:  Perform self-care activities/basic ADLs - able to perform.  Perform functional forward reach - able, with pain to perform.  Reach overhead  - able, with pain to perform.  Throw - unable to perform.  Lift greater than 10 lbs - able, with pain to perform.  Return to work with restrictions - able to perform.  Return to work full duty - able, with pain to perform.  Return to sport/activities - unable to perform.    Assessment:   Findings consistent with 59 y.o. male with right shoulder pain AC jt sprain, Biceps tendonitis, RTC impingement with pain, ROM limitations, strength limitations, functional limitations    Personal factors affecting treatment/recovery:   none identified  Comorbidities affecting treatment/recovery:   none noted  Clinical presentation:   stable  Patient complexity:     low level as indicated by above stability of condition, personal factors, environmental factors and comorbidities in addition to patient symptom presentation and impairments found on physical exam.    Prognosis:  Good    Contraindications/Precautions/Limitation:  Per diagnosis    Short Term Goals: (4 week(s)): Decrease c/o max pain to < 4/10 and Minimal assistance with HEP/ education concepts  Long Term Goals: (6 month(s)): Pain/Sx 0 - minimal, ROM/ flexibility WNL , Restoration of functional strength, Independent with HEP/education , Functional return to ADLs / activities without limitations     Treatment Plan:   Options / plan reviewed with patient/family:  Yes  Freq 1-2 times per week/month with adjusted visit frequency per patient status/protocol for 6 month(s)    Treatment plan inclusive of:  Exercise: AROM, AAROM, PROM, Stretching, Strengthening, Progressive Resistive  Manual Techniques:  Joint mobilization, Soft tissue release/massage  Modalities:  Cold pack, Functional/Therapeutic activites per flowsheet, Joint Mobilizations, Moist heat, Ther Exercise per flowsheet  Functional: Plyometrics, Proprioception/Dynamic stability, Sports specific, Functional rehab, Agilities, Work Public house manager, Clinical cytogeneticist, Endurance training      Thank you for the  referral of this patient to [facility] [place].    Marta Antu, PT    SLER x10   Prone I x10   Prone Row x10   TB Row Or x10   TB Ext Or x10   TB IR Or x10   TB ER Or x10                   Minutes   Time Based CPT  Physical Performance test, Therapeutic Exercise, Therapeutic Activities, NM Re-Education, Manual Therapy, Gait Training, Massage, Aquatic Therapy, Canalith Repositioning, Iontophoresis, Ultrasound, Orthotic fitting/training, Prosthetic fitting 30   Service Based (untimed) CPT  PT/OT evaluation, PT/OT re-eval, E-stim-unattended, Mechanical traction, Vasopneumatic device 15   Unbilled time (rest, etc)        Total Treatment Time  45

## 2020-03-12 NOTE — Telephone Encounter (Signed)
DM fua scheduled for 12/21 and pt advised of fasting labs.

## 2020-03-16 NOTE — Progress Notes (Signed)
Pre-Visit Planning    Health Maintenance Due   Topic Date Due    HIV Screening USPSTF/Delta  Never done    IMM-ZOSTER (1 of 2) Never done    Colon Cancer Screening Other  01/30/2019    IMM-INFLUENZA (1) 12/07/2019    COVID-19 Vaccine (3 - Booster for [place] series) 12/16/2019       Notes:  Colonoscopy scheduled with Dr.Sprung 04/30/20    Completed on 03/16/20 by Corey Harold

## 2020-03-19 ENCOUNTER — Ambulatory Visit: Payer: BLUE CROSS/BLUE SHIELD | Admitting: Rehabilitative and Restorative Service Providers"

## 2020-03-23 ENCOUNTER — Other Ambulatory Visit
Admission: RE | Admit: 2020-03-23 | Discharge: 2020-03-23 | Disposition: A | Discharge status: Home / Self Care | Payer: BLUE CROSS/BLUE SHIELD | Source: Ambulatory Visit | Attending: Primary Care | Admitting: Primary Care

## 2020-03-23 DIAGNOSIS — I10 Essential (primary) hypertension: Secondary | ICD-10-CM | POA: Insufficient documentation

## 2020-03-23 DIAGNOSIS — R7301 Impaired fasting glucose: Secondary | ICD-10-CM | POA: Insufficient documentation

## 2020-03-23 LAB — COMPREHENSIVE METABOLIC PANEL
ALT: 33 U/L (ref 0–50)
AST: 27 U/L (ref 0–50)
Albumin: 4.2 g/dL (ref 3.5–5.2)
Alk Phos: 49 U/L (ref 40–130)
Anion Gap: 11 (ref 7–16)
Bilirubin,Total: 0.8 mg/dL (ref 0.0–1.2)
CO2: 24 mmol/L (ref 20–28)
Calcium: 9.1 mg/dL (ref 8.6–10.2)
Chloride: 106 mmol/L (ref 96–108)
Creatinine: 0.98 mg/dL (ref 0.67–1.17)
GFR,Black: 96 *
GFR,Caucasian: 83 *
Glucose: 113 mg/dL — ABNORMAL HIGH (ref 60–99)
Lab: 21 mg/dL — ABNORMAL HIGH (ref 6–20)
Potassium: 4.4 mmol/L (ref 3.3–5.1)
Sodium: 141 mmol/L (ref 133–145)
Total Protein: 6 g/dL — ABNORMAL LOW (ref 6.3–7.7)

## 2020-03-23 LAB — CBC
Hematocrit: 45 % (ref 40–51)
Hemoglobin: 14.1 g/dL (ref 13.7–17.5)
MCH: 26 pg (ref 26–32)
MCHC: 32 g/dL (ref 32–37)
MCV: 83 fL (ref 79–92)
Platelets: 191 10*3/uL (ref 150–330)
RBC: 5.4 MIL/uL (ref 4.6–6.1)
RDW: 13.4 % (ref 11.6–14.4)
WBC: 5.6 10*3/uL (ref 4.2–9.1)

## 2020-03-23 LAB — LIPID PANEL
Chol/HDL Ratio: 4.1
Cholesterol: 140 mg/dL
HDL: 34 mg/dL — ABNORMAL LOW (ref 40–60)
LDL Calculated: 70 mg/dL
Non HDL Cholesterol: 106 mg/dL
Triglycerides: 178 mg/dL — AB

## 2020-03-23 LAB — MICROALBUMIN, URINE, RANDOM
Creatinine,UR: 241 mg/dL (ref 20–300)
Microalbumin,UR: <1.2 mg/dL

## 2020-03-23 LAB — HEMOGLOBIN A1C: Hemoglobin A1C: 6 % — ABNORMAL HIGH

## 2020-03-23 LAB — PSA (EFF.4-2010): PSA (eff. 4-2010): 2.04 ng/mL (ref 0.00–4.00)

## 2020-03-24 NOTE — Progress Notes (Signed)
[facility] Orthopedic Sports/Spine Rehabilitation  PT Treatment Note    Todays Date: 03/26/2020    Name: Lance Peters  DOB: [birth year]  Referring Physician: Lestine Box*  Diagnosis:   1. Impingement syndrome of right shoulder          Visit #: 2     Referring MD: Guy Sandifer, MD: 02/16/2020  Onset of right shoulder pain approximately 1 month ago while he was hunting and he was trying to climb a tree.  As he was pulling himself up he felt a pop in his right shoulder.  He says that he did not have pain right away, did not experience pain until about a week later when his shoulder started to hurt.  The pain is gotten progressively worse since then.  He notes the pain is at the top of the shoulder and in the anterior shoulder.  It comes and goes, sometimes there is no pain at rest.  The pain is generally worse with lifting most objects, abduction, and at night when trying to sleep.  He has not noticed any swelling or deformity.  He notes some mild weakness but has had some intermittent numbness and tingling down to the right hand.  He notes a past history of right shoulder pain years ago and says that he had a cortisone shot in the shoulder that did relieve symptoms at that time.  Treatments for this problem over the past month have included activity modification, and taking anti-inflammatories.  He takes piroxicam daily for ot actually be really nice things actually bring some then she developed some to normal intervals see her for I had some mixup at home all her aches and pains, but in addition has also been taking some ibuprofen    Referral: Right shoulder pain - suspect biceps tendonitis, possible AC sprain/mild separation, likely RC tendinopathy    Subjective:  Pain Assessment: 6  Pt reports no significant changes in pain or function since previous treatment session.  He continues to have a hard time with driving and trying to hold his grandson in his arms (He notes that he wiggles a lot). Has done the HEP  some since last time with no issues. He was supposed to be on vacation but then ended up working 4-18hr days last week.      Objective:  ROM -  Right, Shoulder,   Strength - Therapeutic Exercises per flowsheet  Function: - Unchanged  Education:  Updated HEP, Verbal cues for ther ex, Joint ed concepts    Objective       Supine ABCs x2 2# DB   SLER x30   Prone I x30   Prone Row x30 2#    TB Row Bl x30   TB Ext Gn x30   TB IR Gn x30   TB ER Or x30    Standing AROM scaption 2x10                Treatment:  Ther Exercise per flowsheet    Assessment:   He was able to complete his entire program today with no increase in pain or problems. Discussed modifications to his HEP and he understands them.        Plan of Care:  Continue per Plan of care -  As written; Patient would benefit from skilled rehabilitation services to address the above impairments to restore functional capacity.    Thank you for referring this patient to [facility]  Marta Antu, PT       Minutes   Time Based CPT  Physical Performance test, Therapeutic Exercise, Therapeutic Activities, NM Re-Education, Manual Therapy, Gait Training, Massage, Aquatic Therapy, Canalith Repositioning, Iontophoresis, Ultrasound, Orthotic fitting/training, Prosthetic fitting 40   Service Based (untimed) CPT  PT/OT evaluation, PT/OT re-eval, E-stim-unattended, Mechanical traction, Vasopneumatic device    Unbilled time (rest, etc)        Total Treatment Time 40

## 2020-03-26 ENCOUNTER — Ambulatory Visit: Payer: BLUE CROSS/BLUE SHIELD | Admitting: Rehabilitative and Restorative Service Providers"

## 2020-03-26 DIAGNOSIS — M7541 Impingement syndrome of right shoulder: Secondary | ICD-10-CM

## 2020-03-27 ENCOUNTER — Ambulatory Visit: Payer: BLUE CROSS/BLUE SHIELD | Admitting: Primary Care

## 2020-03-27 ENCOUNTER — Encounter: Payer: Self-pay | Admitting: Primary Care

## 2020-03-27 VITALS — BP 130/80 | HR 58 | Temp 97.1°F | Ht 70.0 in | Wt 187.4 lb

## 2020-03-27 DIAGNOSIS — M1990 Unspecified osteoarthritis, unspecified site: Secondary | ICD-10-CM

## 2020-03-27 DIAGNOSIS — Z125 Encounter for screening for malignant neoplasm of prostate: Secondary | ICD-10-CM

## 2020-03-27 DIAGNOSIS — E782 Mixed hyperlipidemia: Secondary | ICD-10-CM

## 2020-03-27 DIAGNOSIS — I10 Essential (primary) hypertension: Secondary | ICD-10-CM

## 2020-03-27 DIAGNOSIS — R7301 Impaired fasting glucose: Secondary | ICD-10-CM

## 2020-03-27 MED ORDER — DULOXETINE HCL 30 MG PO CPEP *I*
30.0000 mg | DELAYED_RELEASE_CAPSULE | Freq: Every day | ORAL | 5 refills | Status: DC
Start: 2020-03-27 — End: 2020-09-13

## 2020-03-27 NOTE — Progress Notes (Signed)
Lance Peters is a 59 y.o. male   03/27/2020    No chief complaint on file.      HPI:  Lance Peters arrived for a scheduled follow up appointment.    Here for follow-up of chronic medical problems including impaired fasting glucose and hypertension.  Labs reviewed.  Everything is on target.    He is most troubled by joint pain.  Also involving the knees and feet.  Always a problem.  He is working with orthopedics, receiving viscosupplementation therapies.  He has also been using piroxicam for a long time, but does not find it to be at all helpful.  He is still taking supplemental Advil because of breakthrough pain in spite of the piroxicam.  He is aware that that is probably not a good practice because of risk of compounding side effects.    We discussed some options for pain management.  He is a Games developer, so using opioid type pain medications is really not an option.  Discussed trial of Cymbalta, which is used for arthritis and other musculoskeletal pain syndromes.  He is currently on sertraline, for depression.    He is overdue for colon cancer screening.  He had scheduled colonoscopy several times over the past year, but has been canceled because of restrictions due to the COVID-19 pandemic, and was canceled once when he himself had COVID-19 disease.  Currently has colonoscopy scheduled for mid January, although it is anybody's guess whether those outpatient procedures will be allowed, due to the pandemic.    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       Allergies: Wasp venom, Penicillins, and Synvisc  [hylan g-f 20]    Medication list reviewed, changes made  Current Outpatient Medications   Medication Sig    benazepril (LOTENSIN) 10 mg tablet Take 1 tablet (10 mg total) by mouth daily    amLODIPine (NORVASC) 10 mg tablet TAKE 1 TABLET BY MOUTH EVERY DAY    sertraline (ZOLOFT) 50 mg tablet TAKE 1 TABLET BY MOUTH EVERY DAY    piroxicam (FELDENE) 20 MG capsule TAKE 1 CAPSULE BY MOUTH EVERY DAY    atorvastatin (LIPITOR) 20 MG tablet TAKE 1 TABLET BY MOUTH EVERY DAY WITH DINNER    EPINEPHrine 0.3 mg/0.3 mL auto-injector Inject 0.3 mLs (0.3 mg total) into the muscle as needed for Anaphylaxis    acetaminophen (TYLENOL) 325 MG tablet Take by mouth every 6 hours as needed for Pain    aspirin 81 MG tablet Take 81 mg by mouth daily     No current facility-administered medications for this visit.        Allergy list reviewed  Problem list reviewed    OBJECTIVE:  Vitals:    03/27/20 0947   BP: 130/80   Pulse: 58   Temp: 36.2 C (97.1 F)   Weight: 85 kg (187 lb 6.4 oz)   Height: 1.778 m ('5\' 10"' )     Awake and alert, appears well.  Speaking and breathing comfortably.  Recent Results (from the past 336 hour(s))   Microalbumin, Urine, Random    Collection Time: 03/23/20 10:53 AM   Result Value Ref Range    Creatinine,UR 241 20 - 300 mg/dL    Microalbumin,UR <1.20 mg/dL    Microalb/Creat Ratio see below 0.0 - 29.9 mg MA/g CR   Hemoglobin A1c    Collection Time: 03/23/20 10:53 AM   Result Value Ref Range    Hemoglobin A1C 6.0 (H) %   CBC    Collection Time: 03/23/20 10:53 AM   Result Value Ref Range    WBC 5.6 4.2 - 9.1 THOU/uL    RBC 5.4 4.6 - 6.1 MIL/uL    Hemoglobin 14.1 13.7 - 17.5 g/dL    Hematocrit 45 40 - 51 %    MCV 83 79 - 92 fL    MCH 26 26 - 32 pg    MCHC 32 32 - 37 g/dL    RDW 13.4 11.6 - 14.4 %    Platelets 191 150 - 330 THOU/uL   Lipid Panel (Reflex to Direct  LDL if Triglycerides more than 400)    Collection Time: 03/23/20 10:53 AM   Result Value Ref Range    Cholesterol 140 mg/dL    Triglycerides 178 (!)  mg/dL    HDL 34 (L) 40 - 60 mg/dL    LDL Calculated 70 mg/dL    Non HDL Cholesterol 106 mg/dL    Chol/HDL Ratio 4.1    Comprehensive metabolic panel    Collection Time: 03/23/20 10:53 AM   Result Value Ref Range    Sodium 141 133 - 145 mmol/L    Potassium 4.4 3.3 - 5.1 mmol/L    Chloride 106 96 - 108 mmol/L    CO2 24 20 - 28 mmol/L    Anion Gap 11 7 - 16    UN 21 (H) 6 - 20 mg/dL    Creatinine 0.98 0.67 - 1.17 mg/dL    GFR,Caucasian 83 *    GFR,Black 96 *    Glucose 113 (H) 60 - 99 mg/dL    Calcium 9.1 8.6 - 10.2 mg/dL    Total Protein 6.0 (L) 6.3 - 7.7 g/dL    Albumin 4.2 3.5 - 5.2 g/dL    Bilirubin,Total 0.8 0.0 - 1.2 mg/dL    AST 27 0 - 50 U/L    ALT 33 0 - 50 U/L    Alk Phos 49 40 - 130 U/L   PSA (eff.07-2008)    Collection Time: 03/23/20 10:53 AM   Result Value Ref Range    PSA (eff. 07-2008) 2.04 0.00 - 4.00 ng/mL        ASSESSMENT/PLAN:  Impaired fasting glucose-glycemic control is on target.  He is going to continue working on diet, exercise, weight loss.  Medication not indicated.  We will continue to monitor.    Hypertension-blood pressure is on target, continue current medicines unchanged, lifestyle as above.    Hyperlipidemia-cholesterol numbers on target, appropriate statin dose, no changes.    Arthritis pain-not doing well with piroxicam.  Agree that he can probably just stop taking that since is not effective.  He can use his combination ibuprofen/acetaminophen on an as-needed basis.  Recommended starting duloxetine to help with the arthritis pain.  He also has a diagnosis of depression, so the duloxetine may help there.  If it looks like duloxetine is going to be a long-term medication for him, for arthritis pain, we could also consider tapering off the  sertraline to see if the duloxetine could also cover her depression symptoms.  I have asked him to let me know how he is doing, on the duloxetine, after about a month, when he gets to the bottom of the first bottle.    Medication instructions were  discussed with patient (and advocate). Advised that written instructions will also be provided by the pharmacy. Anticipated side effects discussed. Encouraged patient to call with any questions or concerns.

## 2020-03-29 ENCOUNTER — Other Ambulatory Visit: Payer: Self-pay

## 2020-03-29 DIAGNOSIS — Z01812 Encounter for preprocedural laboratory examination: Secondary | ICD-10-CM

## 2020-03-29 DIAGNOSIS — Z20822 Contact with and (suspected) exposure to covid-19: Secondary | ICD-10-CM

## 2020-04-02 ENCOUNTER — Ambulatory Visit: Payer: BLUE CROSS/BLUE SHIELD | Admitting: Rehabilitative and Restorative Service Providers"

## 2020-04-02 DIAGNOSIS — M7541 Impingement syndrome of right shoulder: Secondary | ICD-10-CM

## 2020-04-02 NOTE — Progress Notes (Signed)
[facility] Orthopedic Sports/Spine Rehabilitation  PT Treatment Note    Todays Date: 04/02/2020    Name: Lance Peters  DOB: 1960/07/18  Referring Physician: Lestine Box*  Diagnosis:   1. Impingement syndrome of right shoulder          Visit #: 3     Referring MD: Guy Sandifer, MD: 02/16/2020  Onset of right shoulder pain approximately 1 month ago while he was hunting and he was trying to climb a tree.  As he was pulling himself up he felt a pop in his right shoulder.  He says that he did not have pain right away, did not experience pain until about a week later when his shoulder started to hurt.  The pain is gotten progressively worse since then.  He notes the pain is at the top of the shoulder and in the anterior shoulder.  It comes and goes, sometimes there is no pain at rest.  The pain is generally worse with lifting most objects, abduction, and at night when trying to sleep.  He has not noticed any swelling or deformity.  He notes some mild weakness but has had some intermittent numbness and tingling down to the right hand.  He notes a past history of right shoulder pain years ago and says that he had a cortisone shot in the shoulder that did relieve symptoms at that time.  Treatments for this problem over the past month have included activity modification, and taking anti-inflammatories.  He takes piroxicam daily for ot actually be really nice things actually bring some then she developed some to normal intervals see her for I had some mixup at home all her aches and pains, but in addition has also been taking some ibuprofen    Referral: Right shoulder pain - suspect biceps tendonitis, possible AC sprain/mild separation, likely RC tendinopathy    Subjective:  Pain Assessment: 0  Pt reports an improvement in pain intensity and pain frequency since previous treatment session.  He is feeling      Objective:  ROM -  Right, Shoulder,   Strength - Therapeutic Exercises per flowsheet  Function: -  Unchanged  Education:  Updated HEP, Verbal cues for ther ex, Joint ed concepts    Objective       Supine ABCs x2 4# DB   SLER 2x10 2# DB   Prone I x30 2# DB   Prone Row x30 4# DB   Prone T 2x10    TB Row Bl x30   TB Ext Gn x30   TB IR Gn x30   TB ER Or x30    Standing AROM scaption 2x10  1# DB   Standing TDs  2x10            Treatment:  Ther Exercise per flowsheet    Assessment:   Progressed his RTC and scapular stability program today into some more off of the body work with no pain or problems. Reviewed modifications to his HEP and he understands.        Plan of Care:  Continue per Plan of care -  As written; Patient would benefit from skilled rehabilitation services to address the above impairments to restore functional capacity.    Thank you for referring this patient to [facility], PT       Minutes   Time Based CPT  Physical Performance test, Therapeutic Exercise, Therapeutic Activities, NM Re-Education, Manual  Therapy, Gait Training, Massage, Aquatic Therapy, Canalith Repositioning, Iontophoresis, Ultrasound, Orthotic fitting/training, Prosthetic fitting 40   Service Based (untimed) CPT  PT/OT evaluation, PT/OT re-eval, E-stim-unattended, Mechanical traction, Vasopneumatic device    Unbilled time (rest, etc)        Total Treatment Time 40

## 2020-04-04 ENCOUNTER — Encounter: Payer: Self-pay | Admitting: Sports Medicine

## 2020-04-04 ENCOUNTER — Ambulatory Visit: Payer: BLUE CROSS/BLUE SHIELD | Admitting: Sports Medicine

## 2020-04-04 VITALS — BP 110/68 | Ht 70.0 in | Wt 180.0 lb

## 2020-04-04 DIAGNOSIS — M170 Bilateral primary osteoarthritis of knee: Secondary | ICD-10-CM

## 2020-04-04 MED ORDER — HYALURONAN 30 MG/2ML IX SOSY *I*
30.0000 mg | PREFILLED_SYRINGE | Freq: Once | INTRA_ARTICULAR | Status: AC | PRN
Start: 2020-04-04 — End: 2020-04-04
  Administered 2020-04-04: 30 mg via INTRA_ARTICULAR

## 2020-04-04 MED ORDER — ETHYL CHLORIDE EX AERO (MULTIPLE PATIENTS) *I*
1.0000 | INHALATION_SPRAY | Freq: Once | CUTANEOUS | Status: AC | PRN
Start: 2020-04-04 — End: 2020-04-04
  Administered 2020-04-04: 1 via TOPICAL

## 2020-04-04 NOTE — Procedures (Signed)
Large Joint Aspiration/Injection Procedure: R knee intra - articular    Date/Time: 04/04/2020 10:00 AM EST  Consent given by: patient  Site marked: site marked  Timeout: Immediately prior to procedure a time out was called to verify the correct patient, procedure, equipment, support staff and site/side marked as required     Procedure Details    Location: knee - R knee intra - articular  Preparation: The site was prepped using the usual aseptic technique.  Anesthetics administered: 1 spray ethyl chloride  Viscosupplementation & other medications administered: 30 mg hyaluronate 30 MG/2ML  Dressing:  A dry, sterile dressing was applied.  Patient tolerance: patient tolerated the procedure well with no immediate complications

## 2020-04-04 NOTE — Procedures (Signed)
Large Joint Aspiration/Injection Procedure: L knee intra - articular    Date/Time: 04/04/2020 10:00 AM EST  Consent given by: patient  Site marked: site marked  Timeout: Immediately prior to procedure a time out was called to verify the correct patient, procedure, equipment, support staff and site/side marked as required     Procedure Details    Location: knee - L knee intra - articular  Preparation: The site was prepped using the usual aseptic technique.  Anesthetics administered: 1 spray ethyl chloride  Viscosupplementation & other medications administered: 30 mg hyaluronate 30 MG/2ML  Dressing:  A dry, sterile dressing was applied.  Patient tolerance: patient tolerated the procedure well with no immediate complications

## 2020-04-09 ENCOUNTER — Ambulatory Visit
Payer: BLUE CROSS/BLUE SHIELD | Attending: Sports Medicine | Admitting: Rehabilitative and Restorative Service Providers"

## 2020-04-09 DIAGNOSIS — M7541 Impingement syndrome of right shoulder: Secondary | ICD-10-CM | POA: Insufficient documentation

## 2020-04-09 NOTE — Progress Notes (Signed)
[facility] Orthopedic Sports/Spine Rehabilitation  PT Treatment Note    Todays Date: 04/09/2020    Name: Lance Peters  DOB: 1960-07-02  Referring Physician: Lestine Box*  Diagnosis:   1. Impingement syndrome of right shoulder          Visit #: 4     Referring MD: Guy Sandifer, MD: 02/16/2020  Onset of right shoulder pain approximately 1 month ago while he was hunting and he was trying to climb a tree.  As he was pulling himself up he felt a pop in his right shoulder.  He says that he did not have pain right away, did not experience pain until about a week later when his shoulder started to hurt.  The pain is gotten progressively worse since then.  He notes the pain is at the top of the shoulder and in the anterior shoulder.  It comes and goes, sometimes there is no pain at rest.  The pain is generally worse with lifting most objects, abduction, and at night when trying to sleep.  He has not noticed any swelling or deformity.  He notes some mild weakness but has had some intermittent numbness and tingling down to the right hand.  He notes a past history of right shoulder pain years ago and says that he had a cortisone shot in the shoulder that did relieve symptoms at that time.  Treatments for this problem over the past month have included activity modification, and taking anti-inflammatories.  He takes piroxicam daily for ot actually be really nice things actually bring some then she developed some to normal intervals see her for I had some mixup at home all her aches and pains, but in addition has also been taking some ibuprofen    Referral: Right shoulder pain - suspect biceps tendonitis, possible AC sprain/mild separation, likely RC tendinopathy    Subjective:  Pain Assessment: 0  Pt reports an improvement in pain intensity and pain frequency since previous treatment session.  He is feeling pretty good coming in to PT today. He was back to work today and did not notice it as much as he has in the weeks prior  to starting PT. Has been working on his HEP with no problems.      Objective:  ROM -  Right, Shoulder,   Strength - Therapeutic Exercises per flowsheet  Function: - Unchanged  Education:  Updated HEP, Verbal cues for ther ex, Joint ed concepts    Objective       Supine ABCs x2 5# DB   SLER x30 5# DB   Prone I x30 5# DB   Prone Row x30 5# DB   Prone T 2x10 2# DB   TB Row Bl x30   TB Ext Gn x30   TB IR Gn x30   TB ER Or x30    TB W Or 2x10   Standing AROM scaption 2x10 2# DB   Standing TDs  2x10 2# DB           Treatment:  Ther Exercise per flowsheet    Assessment:   Progressed his RTC and scapular stability program today into some more off of the body work with no pain or problems. Reviewed modifications to his HEP and he understands.        Plan of Care:  Continue per Plan of care -  As written; Patient would benefit from skilled rehabilitation services to address the above impairments to restore functional capacity.    Thank  you for referring this patient to [facility], PT       Minutes   Time Based CPT  Physical Performance test, Therapeutic Exercise, Therapeutic Activities, NM Re-Education, Manual Therapy, Gait Training, Massage, Aquatic Therapy, Canalith Repositioning, Iontophoresis, Ultrasound, Orthotic fitting/training, Prosthetic fitting 40   Service Based (untimed) CPT  PT/OT evaluation, PT/OT re-eval, E-stim-unattended, Mechanical traction, Vasopneumatic device    Unbilled time (rest, etc)        Total Treatment Time 40

## 2020-04-11 ENCOUNTER — Encounter: Payer: Self-pay | Admitting: Sports Medicine

## 2020-04-11 ENCOUNTER — Ambulatory Visit: Payer: BLUE CROSS/BLUE SHIELD | Admitting: Sports Medicine

## 2020-04-11 VITALS — BP 127/78 | HR 57 | Ht 70.0 in | Wt 180.0 lb

## 2020-04-11 DIAGNOSIS — M170 Bilateral primary osteoarthritis of knee: Secondary | ICD-10-CM

## 2020-04-11 DIAGNOSIS — G8929 Other chronic pain: Secondary | ICD-10-CM

## 2020-04-11 DIAGNOSIS — M25561 Pain in right knee: Secondary | ICD-10-CM

## 2020-04-11 DIAGNOSIS — M25562 Pain in left knee: Secondary | ICD-10-CM

## 2020-04-11 MED ORDER — ETHYL CHLORIDE EX AERO (MULTIPLE PATIENTS) *I*
1.0000 | INHALATION_SPRAY | Freq: Once | CUTANEOUS | Status: AC | PRN
Start: 2020-04-11 — End: 2020-04-11
  Administered 2020-04-11: 1 via TOPICAL

## 2020-04-11 MED ORDER — HYALURONAN 30 MG/2ML IX SOSY *I*
30.0000 mg | PREFILLED_SYRINGE | Freq: Once | INTRA_ARTICULAR | Status: AC | PRN
Start: 2020-04-11 — End: 2020-04-11
  Administered 2020-04-11: 30 mg via INTRA_ARTICULAR

## 2020-04-11 NOTE — Patient Instructions (Signed)
Injection Instructions    · The injection you received today may take 5-7 days to work in the case of cortisone or 3 weeks or more for hyaluronic acid viscosupplements (Synvics, Hyalgan, Orthovisc or others).   · You may find the area that was injected to be more painful for 1 or 2 days after the injection.  This happens as the medicine is being absorbed in your body.  · It may be helpful to put ice on the body part that was injected to ease the pain.  · You may also use pain medication - Tylenol, Advil/Motrin or Aleve as appropriate.  · A cortisone injection may cause systemic reactions, including flushing, feeling hot, irritability or inability to sleep  · If you are diabetic, a cortisone injection can increase your blood sugar level for several days.  Monitor your glucose levels closely.  · Contact the office [phone] if you feel ill, have excessive pain, if the area turns red or you have a fever

## 2020-04-11 NOTE — Procedures (Signed)
Large Joint Aspiration/Injection Procedure: L knee intra - articular    Date/Time: 04/11/2020  3:40 PM EST  Consent given by: patient  Site marked: site marked  Timeout: Immediately prior to procedure a time out was called to verify the correct patient, procedure, equipment, support staff and site/side marked as required     Procedure Details    Location: knee - L knee intra - articular  Preparation: The site was prepped using the usual aseptic technique.  Anesthetics administered: 1 spray ethyl chloride  Viscosupplementation & other medications administered: 30 mg hyaluronate 30 MG/2ML  Dressing:  A dry, sterile dressing was applied.  Patient tolerance: patient tolerated the procedure well with no immediate complications

## 2020-04-11 NOTE — Procedures (Signed)
Large Joint Aspiration/Injection Procedure: R knee intra - articular    Date/Time: 04/11/2020  3:40 PM EST  Consent given by: patient  Site marked: site marked  Timeout: Immediately prior to procedure a time out was called to verify the correct patient, procedure, equipment, support staff and site/side marked as required     Procedure Details    Location: knee - R knee intra - articular  Preparation: The site was prepped using the usual aseptic technique.  Anesthetics administered: 1 spray ethyl chloride  Viscosupplementation & other medications administered: 30 mg hyaluronate 30 MG/2ML  Dressing:  A dry, sterile dressing was applied.  Patient tolerance: patient tolerated the procedure well with no immediate complications

## 2020-04-15 ENCOUNTER — Encounter: Payer: Self-pay | Admitting: Liver Pancreas GI Surgery

## 2020-04-16 ENCOUNTER — Ambulatory Visit: Payer: BLUE CROSS/BLUE SHIELD | Admitting: Rehabilitative and Restorative Service Providers"

## 2020-04-18 ENCOUNTER — Telehealth: Payer: Self-pay

## 2020-04-18 ENCOUNTER — Ambulatory Visit: Payer: BLUE CROSS/BLUE SHIELD | Admitting: Sports Medicine

## 2020-04-18 VITALS — BP 145/82 | HR 66 | Ht 70.0 in | Wt 180.0 lb

## 2020-04-18 DIAGNOSIS — M170 Bilateral primary osteoarthritis of knee: Secondary | ICD-10-CM

## 2020-04-18 MED ORDER — ETHYL CHLORIDE EX AERO (MULTIPLE PATIENTS) *I*
1.0000 | INHALATION_SPRAY | Freq: Once | CUTANEOUS | Status: AC | PRN
Start: 2020-04-18 — End: 2020-04-18
  Administered 2020-04-18: 1 via TOPICAL

## 2020-04-18 MED ORDER — HYALURONAN 30 MG/2ML IX SOSY *I*
30.0000 mg | PREFILLED_SYRINGE | Freq: Once | INTRA_ARTICULAR | Status: AC | PRN
Start: 2020-04-18 — End: 2020-04-18
  Administered 2020-04-18: 30 mg via INTRA_ARTICULAR

## 2020-04-18 NOTE — Procedures (Signed)
Large Joint Aspiration/Injection Procedure: L knee intra - articular    Date/Time: 04/18/2020  3:00 PM EST  Consent given by: patient  Site marked: site marked  Timeout: Immediately prior to procedure a time out was called to verify the correct patient, procedure, equipment, support staff and site/side marked as required     Procedure Details    Location: knee - L knee intra - articular  Preparation: The site was prepped using the usual aseptic technique.  Anesthetics administered: 1 spray ethyl chloride  Viscosupplementation & other medications administered: 30 mg hyaluronate 30 MG/2ML  Dressing:  A dry, sterile dressing was applied.  Patient tolerance: patient tolerated the procedure well with no immediate complications

## 2020-04-18 NOTE — Procedures (Signed)
Large Joint Aspiration/Injection Procedure: R knee intra - articular    Date/Time: 04/18/2020  3:00 PM EST  Consent given by: patient  Site marked: site marked  Timeout: Immediately prior to procedure a time out was called to verify the correct patient, procedure, equipment, support staff and site/side marked as required     Procedure Details    Location: knee - R knee intra - articular  Preparation: The site was prepped using the usual aseptic technique.  Anesthetics administered: 1 spray ethyl chloride  Viscosupplementation & other medications administered: 30 mg hyaluronate 30 MG/2ML  Dressing:  A dry, sterile dressing was applied.  Patient tolerance: patient tolerated the procedure well with no immediate complications

## 2020-04-18 NOTE — Telephone Encounter (Signed)
Spoke with patient and discussed the following  items prior to their COL procedure on 04/30/2020 with  Dr. Crist Infante .      Has the patient received their prep instructions? Yes    Has the patient discussed how to proceed with any prescribed blood thinning medications excluding Aspirin with a nurse, if applicable? No Comment: NA       If you develop any COVID symptoms:      Body aches   Congestion/runny nose   Cough   Difficulty breathing   Fever   Loss of taste/smell   Severe fatigue   Severe migraine   Shaking chills   Sore throat    Prior to your procedure, you must call the office at [phone].        Confirmed arrival time for patient (45 minutes Mod Sed) (1.5 hours GA) Yes       ** Do not give arrival time to [facility] Patients**      PATIENT WAS INFORMED A NEGATIVE COVID RESULT MUST BE OBTAINED IN ORDER TO PROCEED WITH SCHEDULED PROCEDURE.

## 2020-04-24 ENCOUNTER — Ambulatory Visit: Payer: BLUE CROSS/BLUE SHIELD | Admitting: Sports Medicine

## 2020-04-26 ENCOUNTER — Telehealth: Payer: Self-pay | Admitting: Primary Care

## 2020-04-26 ENCOUNTER — Ambulatory Visit: Payer: BLUE CROSS/BLUE SHIELD | Admitting: Sports Medicine

## 2020-04-26 ENCOUNTER — Ambulatory Visit: Payer: BLUE CROSS/BLUE SHIELD | Attending: Liver Pancreas GI Surgery

## 2020-04-26 DIAGNOSIS — Z01812 Encounter for preprocedural laboratory examination: Secondary | ICD-10-CM | POA: Insufficient documentation

## 2020-04-26 DIAGNOSIS — Z20828 Contact with and (suspected) exposure to other viral communicable diseases: Secondary | ICD-10-CM | POA: Insufficient documentation

## 2020-04-26 DIAGNOSIS — Z20822 Contact with and (suspected) exposure to covid-19: Secondary | ICD-10-CM | POA: Insufficient documentation

## 2020-04-26 LAB — COVID-19 NAAT (PCR): COVID-19 NAAT (PCR): NEGATIVE

## 2020-04-26 LAB — COVID-19 PCR

## 2020-04-26 NOTE — Telephone Encounter (Signed)
Spoke with pt, the medication is working well for the arthritis as well as the depression. He notes he no longer is taking the Piroxicam.

## 2020-04-26 NOTE — Telephone Encounter (Signed)
Noted.  Piroxicam removed from medication list

## 2020-04-26 NOTE — Telephone Encounter (Addendum)
Please call for update from patient on duloxetine efficacy for arthritis and depression December 21

## 2020-04-30 ENCOUNTER — Ambulatory Visit: Payer: BLUE CROSS/BLUE SHIELD | Admitting: Rehabilitative and Restorative Service Providers"

## 2020-04-30 ENCOUNTER — Encounter
Admission: RE | Disposition: A | Discharge status: Home / Self Care | Payer: Self-pay | Source: Ambulatory Visit | Attending: Liver Pancreas GI Surgery

## 2020-04-30 ENCOUNTER — Ambulatory Visit
Admission: RE | Admit: 2020-04-30 | Discharge: 2020-04-30 | Disposition: A | Discharge status: Home / Self Care | Payer: BLUE CROSS/BLUE SHIELD | Source: Ambulatory Visit | Attending: Liver Pancreas GI Surgery | Admitting: Liver Pancreas GI Surgery

## 2020-04-30 ENCOUNTER — Encounter: Payer: Self-pay | Admitting: Liver Pancreas GI Surgery

## 2020-04-30 DIAGNOSIS — K5730 Diverticulosis of large intestine without perforation or abscess without bleeding: Secondary | ICD-10-CM

## 2020-04-30 DIAGNOSIS — Z1211 Encounter for screening for malignant neoplasm of colon: Secondary | ICD-10-CM | POA: Insufficient documentation

## 2020-04-30 DIAGNOSIS — Z86010 Personal history of colonic polyps: Secondary | ICD-10-CM | POA: Insufficient documentation

## 2020-04-30 DIAGNOSIS — Z8371 Family history of colonic polyps: Secondary | ICD-10-CM | POA: Insufficient documentation

## 2020-04-30 DIAGNOSIS — K648 Other hemorrhoids: Secondary | ICD-10-CM | POA: Insufficient documentation

## 2020-04-30 HISTORY — PX: PR COLONOSCOPY FLX DX W/COLLJ SPEC WHEN PFRMD: 45378

## 2020-04-30 LAB — HM COLONOSCOPY

## 2020-04-30 SURGERY — COLONOSCOPY

## 2020-04-30 MED ORDER — FENTANYL CITRATE 50 MCG/ML IJ SOLN *WRAPPED*
INTRAMUSCULAR | Status: AC | PRN
Start: 2020-04-30 — End: 2020-04-30
  Administered 2020-04-30 (×2): 25 ug via INTRAVENOUS
  Administered 2020-04-30: 50 ug via INTRAVENOUS

## 2020-04-30 MED ORDER — FENTANYL CITRATE 50 MCG/ML IJ SOLN *WRAPPED*
INTRAMUSCULAR | Status: AC
Start: 2020-04-30 — End: 2020-04-30
  Filled 2020-04-30: qty 2

## 2020-04-30 MED ORDER — LIDOCAINE HCL (PF) 1 % IJ SOLN *I*
0.1000 mL | INTRAMUSCULAR | Status: DC | PRN
Start: 2020-04-30 — End: 2020-04-30

## 2020-04-30 MED ORDER — SODIUM CHLORIDE 0.9 % FLUSH FOR PUMPS *I*
0.0000 mL/h | INTRAVENOUS | Status: DC | PRN
Start: 2020-04-30 — End: 2020-04-30

## 2020-04-30 MED ORDER — MIDAZOLAM HCL 1 MG/ML IJ SOLN *I* WRAPPED
INTRAMUSCULAR | Status: AC | PRN
Start: 2020-04-30 — End: 2020-04-30
  Administered 2020-04-30: 1 mg via INTRAVENOUS
  Administered 2020-04-30 (×2): 2 mg via INTRAVENOUS

## 2020-04-30 MED ORDER — DEXTROSE 5 % FLUSH FOR PUMPS *I*
0.0000 mL/h | INTRAVENOUS | Status: DC | PRN
Start: 2020-04-30 — End: 2020-04-30

## 2020-04-30 MED ORDER — SODIUM CHLORIDE 0.9 % IV SOLN WRAPPED *I*
50.0000 mL/h | Status: DC
Start: 2020-04-30 — End: 2020-04-30
  Administered 2020-04-30: 50 mL/h via INTRAVENOUS

## 2020-04-30 MED ORDER — MIDAZOLAM HCL 1 MG/ML IJ SOLN *I* WRAPPED
INTRAMUSCULAR | Status: AC
Start: 2020-04-30 — End: 2020-04-30
  Filled 2020-04-30: qty 10

## 2020-04-30 SURGICAL SUPPLY — 16 items
CLIP HEMOSTASIS ENDO INSTINCT 7FRX230CM (Supply) IMPLANT
ELECTRODE ADULT POLYHESIVE PAT RETURN (Supply) IMPLANT
FORCEPS BIOPSY RADIAL JAW LG (Other) IMPLANT
FORCEPS DISPOSABLE ALLIGATOR JAW W/NEEDLE (Supply) IMPLANT
PROBE CIRCUMFERENTAIL FIRE (Supply) IMPLANT
PROBE COAG 2000A 6.9FR L2.2M INTEGR STYL W/ FLTR FOR FLX ENDOSCP INTERVENTION FIAPC (Supply) IMPLANT
PROBE COAG 6.9FR L2.2M CNVNENT BLT IN FLTR 2200SC SIMPLIFIED SETUP PLUG PLAY FUNCTIONALITY (Supply) IMPLANT
PROBE SIDE FIRE (Supply)
PROBE STRAIGHT FIRE (Supply)
SNARE MICRO MINI (Supply)
SNARE MINI SOFT ACU 1.5 X 3 (Supply) IMPLANT
SNARE POLYP 1CMX1.5CM SFT GI MINI MIC OVL ACUSNR (Supply) IMPLANT
SNARE SOFT JUMBO ENDO DISPO (Supply) IMPLANT
SNARE STANDARD SOFT ACU 2.5 X 5.5 (Supply) IMPLANT
TRAP POLYP MULTICHAMBER DISP OPTIMIZER (Other) IMPLANT
TRAPS POLY (Other)

## 2020-04-30 NOTE — Preop H&P (Signed)
OUTPATIENT PRE-PROCEDURE H&P    Chief Complaint / Indications for Procedure: Surveillance for personal history of colorectal polyps.        Past Medical History:     Past Medical History:   Diagnosis Date    Arthritis     elbows and shoulders     Chest pain, unspecified     Depression 05/23/2014    Neuromuscular disorder     elbows, shoulders    Unspecified essential hypertension 06/02/2013     Past Surgical History:   Procedure Laterality Date    HAND SURGERY      trigger finger release     PR ARTHRS KNE SURG W/MENISCECTOMY MED/LAT W/SHVG Right 07/16/2016    Procedure: KNEE ARTHROSCOPY WITH PARTIAL MEDIAL MENISCECTOMY;  Surgeon: Jannifer Franklin, MD;  Location: [facility];  Service: Orthopedics     Family History   Problem Relation Age of Onset    Colon polyps Mother     Cancer Mother     Colon cancer Neg Hx      Social History     Socioeconomic History    Marital status: Married     Spouse name: Not on file    Number of children: Not on file    Years of education: Not on file    Highest education level: Not on file   Tobacco Use    Smoking status: Never Smoker    Smokeless tobacco: Never Used   Substance and Sexual Activity    Alcohol use: No     Alcohol/week: 1.0 standard drink     Types: 1 Cans of beer per week     Comment: abstinent since april 2018    Drug use: No    Sexual activity: Not on file   Other Topics Concern    Special Diet Not Asked    Exercise Not Asked    Weight Concern Not Asked    Caffeine Concern Not Asked   Social History Narrative    Not on file       Allergies:    Allergies   Allergen Reactions    Wasp Venom Hives and Swelling     Bees also per patient          Penicillins Hives    Synvisc [Hylan G-F 20] Other (See Comments)     Possible pseudoseptic reaction to Synvisc-1 injections 09/2019       Medications:  Medications Prior to Admission   Medication Sig    acetaminophen (TYLENOL) 325 MG tablet Take by mouth every 6 hours as needed for Pain    DULoxetine (CYMBALTA)  30 mg DR capsule Take 1 capsule (30 mg total) by mouth daily  for Musculoskeletal Pain    benazepril (LOTENSIN) 10 mg tablet Take 1 tablet (10 mg total) by mouth daily    amLODIPine (NORVASC) 10 mg tablet TAKE 1 TABLET BY MOUTH EVERY DAY    sertraline (ZOLOFT) 50 mg tablet TAKE 1 TABLET BY MOUTH EVERY DAY    atorvastatin (LIPITOR) 20 MG tablet TAKE 1 TABLET BY MOUTH EVERY DAY WITH DINNER    EPINEPHrine 0.3 mg/0.3 mL auto-injector Inject 0.3 mLs (0.3 mg total) into the muscle as needed for Anaphylaxis    aspirin 81 MG tablet Take 81 mg by mouth daily      Current Facility-Administered Medications   Medication Dose Route Frequency    sodium chloride 0.9 % FLUSH REQUIRED IF PATIENT HAS IV  0-500 mL/hr Intravenous PRN    dextrose 5 %  FLUSH REQUIRED IF PATIENT HAS IV  0-500 mL/hr Intravenous PRN    sodium chloride 0.9 % IV  50 mL/hr Intravenous Continuous    lidocaine PF 1 % injection 0.1 mL  0.1 mL Subcutaneous PRN     Vitals:    04/30/20 1322   BP: 137/84   Pulse: 65   Resp: 14   Temp: 36.6 C (97.9 F)   Weight: 79.8 kg (176 lb)   Height: 177.8 cm (5\' 10" )       ROS:  XA:JOINOMVE    Physical Examination:  Head/Nose/Throat:negative  Lungs:Lungs clear  Cardiovascular:normal S1 and S2  Abdomen: abdomen soft, non-tender, nondistended, normal active bowel sounds, no masses or organomegaly      Lab Results: none    Radiology impressions (last 30 days):  No results found.    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 Updates to Patient's Condition (to be completed by a provider privileged to complete a H&P, following reassessment of the patient by the provider):    Full H&P done today; no updates needed.    II. Procedure Readiness   I have reviewed the patient's H&P and updated condition. By completing and signing this form, I attest that this patient is ready for surgery/procedure.      III. Attestation   I have reviewed the updated information regarding the patient's condition and it is appropriate to proceed with the planned surgery/procedure.    Harlin Rain, MD as of 1:31 PM 04/30/2020

## 2020-04-30 NOTE — Discharge Instructions (Signed)
Gastroenterology Unit  Discharge Instructions for Colonoscopy      04/30/2020    2:04 PM    Colonoscopy    Do not drive, operate heavy machinery, drink alcoholic beverages, make important personal or business decisions, or sign legal documents until the next day.      Return to your usual diet   Return to taking your usual medications    Things you may expect:   A small amount of bright red blood in your stool   It may be a few days before you have a bowel movement   You may have cramping, bloating, and feelings of "gas". These feelings should go away as you pass gas. If you still feel uncomfortable, walking around will help to pass the gas.   You were given medication to help you relax during the test. You may feel "fuzzy" and drowsy. Go home and rest for at least 4-6 hours.    You should call your doctor for any of the following:   Bad stomach pain   Fever   Bright red bleeding or clots (This may happen up to 20 days after the test.)   Dizziness or weakness that gets worse or lasts up to 24 hours.   Pain or redness at the IV site    If you have a serious problem after hours, Call [phone] to reach the GI physician on call. If you are unable to reach your doctor, go to the [facility] Emergency Department.    Follow Up Care:   Report will be sent to your primary doctor.   Repeat exam in  5 year(s)    Current Discharge Medication List

## 2020-04-30 NOTE — Procedures (Signed)
Procedure Report     Colonoscopy Procedure Note   Date of Procedure: 04/30/2020   Referring Physician: Penni Bombard, MD  Primary Physician: Penni Bombard, MD        Attending Physician: Harlin Rain, MD  Fellow:   None  Indications:  Surveillance for history of colonic polyps  Previous colonoscopy: Yes -- Date: 01/25/2016, 09/03/2012 adenomas  Medications: Fentanyl 100 mcg IV and Midazolam 5 mg IV were administered incrementally over the course of the procedure to achieve an adequate level of conscious sedation.    Moderate Sedation Face Times  Start Time: 6213  End Time: 1407  Duration (minutes): 29 Minutes        I provided moderate sedation.  During this time I was face-to-face with the patient and supervising the RN; who monitored the patient's level of consciousness and physiological status.    Scopes: [id number]    Procedure Details: Full disclosures of risks were reviewed with patient as detailed on the consent form. The patient was placed in the left lateral decubitus position and monitored with continuous pulse oximetry, interval blood pressure monitoring and direct observations.   After anorectal examination was performed, the colonoscope was inserted into the rectum and advanced under direct vision to the terminal ileum. The procedure was considered not difficult.  During withdrawal examination, the final quality of the prep was [facility] Bowel Prep Scale:  Right Colon: Grade3- (entire mucosa of colon segment seen well, with no residual staining, small fragments of stool, or opaque liquid)  Transverse Colon: Grade 3- (entire mucosa of colon segment seen well, with no residual staining, small fragments of stool, or opaque liquid)  Left Colon: Grade 3- (entire mucosa of colon segment seen well, with no residual staining, small fragments of stool, or opaque liquid)  A careful inspection was made as the colonoscope was withdrawn. A retroflexed view of the rectum was performed; findings and  interventions are described below. The patient was recovered in the GI recovery area.     Scope Times:     Insertion Time: 1344  Cecum Time: 9629  Exit Time: 1400    Findings:    Anorectal:   Normal tone, no masses  Terminal Ileum:   Normal ileal mucosa  Cecum:   Normal mucosa  Ascending Colon:   Normal mucosa  Transverse Colon:   Normal mucosa  Descending Colon:   Diverticulosis: scattered, small in size  Sigmoid:   Diverticulosis: scattered, small in size    Endoscopic tattoo noted at 28-30cm insertion, no polyp    Rectum:   Normal rectum throughout    Internal hemorrhoids       Intervention(s):      None      Complication(s):     none    EBL: 0 ml    Impression(s):     Diverticulosis, located in the left colon.   Endoscopic tattoo in the sigmoid colon without evidence for polyp recurrence.   Internal hemorrhoids.   Otherwise normal colonoscopy to the terminal ileum. No polyps or masses.    Recommendation(s):     Repeat colonoscopy in 5 years (hx advanced adenoma, multiple adenomas).   Follow up with primary care physician.    Histopathologic Diagnosis:  pending    Harlin Rain, MD

## 2020-05-01 ENCOUNTER — Encounter: Payer: Self-pay | Admitting: Liver Pancreas GI Surgery

## 2020-05-10 ENCOUNTER — Ambulatory Visit
Payer: BLUE CROSS/BLUE SHIELD | Attending: Sports Medicine | Admitting: Rehabilitative and Restorative Service Providers"

## 2020-05-10 DIAGNOSIS — M7541 Impingement syndrome of right shoulder: Secondary | ICD-10-CM

## 2020-05-10 NOTE — Progress Notes (Signed)
[facility] Orthopedic Sports/Spine Rehabilitation  PT Treatment Note    Todays Date: 05/10/2020    Name: Lance Peters  DOB: 1961/01/04  Referring Physician: Lestine Box*  Diagnosis:   1. Impingement syndrome of right shoulder          Visit #: 5     Referring MD: Guy Sandifer, MD: 02/16/2020  Onset of right shoulder pain approximately 1 month ago while he was hunting and he was trying to climb a tree.  As he was pulling himself up he felt a pop in his right shoulder.  He says that he did not have pain right away, did not experience pain until about a week later when his shoulder started to hurt.  The pain is gotten progressively worse since then.  He notes the pain is at the top of the shoulder and in the anterior shoulder.  It comes and goes, sometimes there is no pain at rest.  The pain is generally worse with lifting most objects, abduction, and at night when trying to sleep.  He has not noticed any swelling or deformity.  He notes some mild weakness but has had some intermittent numbness and tingling down to the right hand.  He notes a past history of right shoulder pain years ago and says that he had a cortisone shot in the shoulder that did relieve symptoms at that time.  Treatments for this problem over the past month have included activity modification, and taking anti-inflammatories.  He takes piroxicam daily for ot actually be really nice things actually bring some then she developed some to normal intervals see her for I had some mixup at home all her aches and pains, but in addition has also been taking some ibuprofen    Referral: Right shoulder pain - suspect biceps tendonitis, possible AC sprain/mild separation, likely RC tendinopathy    Subjective:  Pain Assessment: 0  Pt reports an improvement in pain intensity and pain frequency since previous treatment session.  He is feeling pretty good coming in to PT today. His shoulder is not doing as well as it had been but he also notes that it is not as  bad as it had been either.      Objective:  ROM -  Right, Shoulder,   Strength - Therapeutic Exercises per flowsheet  Function: - Unchanged  Education:  Updated HEP, Verbal cues for ther ex, Joint ed concepts    Objective       Supine ABCs x2 5# DB   SLER x30 5# DB   Prone I x30 5# DB   Prone Row x30 5# DB   Prone T 2x10 2# DB   TB Row Bl x30   TB Ext Bl x30   TB IR Bl x30   TB ER Gn x30    TB W Or 2x10   Standing AROM scaption 2x10 2# DB   Standing TDs  2x10 2# DB   Ball on wall circles A, L 3x10        Treatment:  Ther Exercise per flowsheet    Assessment:   Progressed his RTC and scapular stability program today into some more off of the body work with no pain or problems. Reviewed modifications to his HEP and he understands.        Plan of Care:  Continue per Plan of care -  As written; Patient would benefit from skilled rehabilitation services to address the above impairments to restore functional capacity.    Thank  you for referring this patient to [place], PT       Minutes   Time Based CPT  Physical Performance test, Therapeutic Exercise, Therapeutic Activities, NM Re-Education, Manual Therapy, Gait Training, Massage, Aquatic Therapy, Canalith Repositioning, Iontophoresis, Ultrasound, Orthotic fitting/training, Prosthetic fitting 40   Service Based (untimed) CPT  PT/OT evaluation, PT/OT re-eval, E-stim-unattended, Mechanical traction, Vasopneumatic device    Unbilled time (rest, etc)        Total Treatment Time 40

## 2020-05-14 ENCOUNTER — Ambulatory Visit: Payer: BLUE CROSS/BLUE SHIELD | Admitting: Rehabilitative and Restorative Service Providers"

## 2020-05-14 DIAGNOSIS — M7541 Impingement syndrome of right shoulder: Secondary | ICD-10-CM

## 2020-05-14 NOTE — Progress Notes (Signed)
[facility] Orthopedic Sports/Spine Rehabilitation  PT Treatment Note    Todays Date: 05/14/2020    Name: Lance Peters  DOB: Jan 21, 1961  Referring Physician: Lestine Box*  Diagnosis:   1. Impingement syndrome of right shoulder          Visit #: 6     Referring MD: Guy Sandifer, MD: 02/16/2020  Onset of right shoulder pain approximately 1 month ago while he was hunting and he was trying to climb a tree.  As he was pulling himself up he felt a pop in his right shoulder.  He says that he did not have pain right away, did not experience pain until about a week later when his shoulder started to hurt.  The pain is gotten progressively worse since then.  He notes the pain is at the top of the shoulder and in the anterior shoulder.  It comes and goes, sometimes there is no pain at rest.  The pain is generally worse with lifting most objects, abduction, and at night when trying to sleep.  He has not noticed any swelling or deformity.  He notes some mild weakness but has had some intermittent numbness and tingling down to the right hand.  He notes a past history of right shoulder pain years ago and says that he had a cortisone shot in the shoulder that did relieve symptoms at that time.  Treatments for this problem over the past month have included activity modification, and taking anti-inflammatories.  He takes piroxicam daily for ot actually be really nice things actually bring some then she developed some to normal intervals see her for I had some mixup at home all her aches and pains, but in addition has also been taking some ibuprofen    Referral: Right shoulder pain - suspect biceps tendonitis, possible AC sprain/mild separation, likely RC tendinopathy    Subjective:  Pain Assessment: 0  Pt reports an improvement in pain intensity and pain frequency since previous treatment session.  He is feeling pretty good coming in to PT today. He had a long night with work but was able to take a nap before coming in today. Has  been working on his HEP some.      Objective:  ROM -  Right, Shoulder,   Strength - Therapeutic Exercises per flowsheet  Function: - Unchanged  Education:  Updated HEP, Verbal cues for ther ex, Joint ed concepts    Objective       Supine punch  x30 6# DB   Supine ABCs x2 6# DB   SLER x30 4# DB   Prone I x30 6# DB   Prone Row x30 6# DB   Prone T x30 2# DB   TB Row Bl x30   TB Ext Bl x30   TB IR Bl x30   TB ER Gn x30    TB W Or 3sx30   Standing AROM scaption 3x10 2# DB   Standing TDs  3x10 3# DB   Ball on wall circles A, L 3x10                    Treatment:  Ther Exercise per flowsheet    Assessment:   Progressed his RTC and scapular stability program today into some more off of the body work with no pain or problems. Reviewed modifications to his HEP and he understands.        Plan of Care:  Continue per Plan of care -  As written; Patient  would benefit from skilled rehabilitation services to address the above impairments to restore functional capacity.    Thank you for referring this patient to [place], PT       Minutes   Time Based CPT  Physical Performance test, Therapeutic Exercise, Therapeutic Activities, NM Re-Education, Manual Therapy, Gait Training, Massage, Aquatic Therapy, Canalith Repositioning, Iontophoresis, Ultrasound, Orthotic fitting/training, Prosthetic fitting 40   Service Based (untimed) CPT  PT/OT evaluation, PT/OT re-eval, E-stim-unattended, Mechanical traction, Vasopneumatic device    Unbilled time (rest, etc)        Total Treatment Time 40

## 2020-05-21 ENCOUNTER — Ambulatory Visit: Payer: BLUE CROSS/BLUE SHIELD | Admitting: Rehabilitative and Restorative Service Providers"

## 2020-05-21 DIAGNOSIS — M7541 Impingement syndrome of right shoulder: Secondary | ICD-10-CM

## 2020-05-21 NOTE — Progress Notes (Signed)
[facility] Orthopedic Sports/Spine Rehabilitation  PT Treatment Note    Todays Date: 05/22/2020    Name: Lance Peters  DOB: [birth year]  Referring Physician: Lestine Box*  Diagnosis:   1. Impingement syndrome of right shoulder          Visit #: 7     Referring MD: Guy Sandifer, MD: 02/16/2020  Onset of right shoulder pain approximately 1 month ago while he was hunting and he was trying to climb a tree.  As he was pulling himself up he felt a pop in his right shoulder.  He says that he did not have pain right away, did not experience pain until about a week later when his shoulder started to hurt.  The pain is gotten progressively worse since then.  He notes the pain is at the top of the shoulder and in the anterior shoulder.  It comes and goes, sometimes there is no pain at rest.  The pain is generally worse with lifting most objects, abduction, and at night when trying to sleep.  He has not noticed any swelling or deformity.  He notes some mild weakness but has had some intermittent numbness and tingling down to the right hand.  He notes a past history of right shoulder pain years ago and says that he had a cortisone shot in the shoulder that did relieve symptoms at that time.  Treatments for this problem over the past month have included activity modification, and taking anti-inflammatories.  He takes piroxicam daily for ot actually be really nice things actually bring some then she developed some to normal intervals see her for I had some mixup at home all her aches and pains, but in addition has also been taking some ibuprofen    Referral: Right shoulder pain - suspect biceps tendonitis, possible AC sprain/mild separation, likely RC tendinopathy    Subjective:  Pain Assessment: 0  Pt reports an improvement in pain intensity and pain frequency since previous treatment session.  He is feeling pretty good coming in to PT today. He  Again had a long night with work. He is feeling stronger and notes that when he  is having some pain if he corrects his posture it helps it to get much better.      Objective:  ROM -  Right, Shoulder,   Strength - Therapeutic Exercises per flowsheet  Function: - Unchanged  Education:  Updated HEP, Verbal cues for ther ex, Joint ed concepts    Objective       Supine punch  x30 6# DB   Supine ABCs x2 6# DB   SLER x30 4# DB   Prone I x30 6# DB   Prone Row x30 6# DB   Prone T x30 2# DB   TB Row Bl x30   TB Ext Bl x30   TB IR Bl x30   TB ER Gn x30    TB W Or 3sx30           Standing AROM scaption 3x10 2# DB   Standing TDs  3x10 3# DB   Ball on wall circles A, L 3x10                    Treatment:  Ther Exercise per flowsheet    Assessment:   Progressed his RTC and scapular stability program today into some more off of the body work with no pain or problems. Reviewed modifications to his HEP and he understands.  Plan of Care:  Continue per Plan of care -  As written; Patient would benefit from skilled rehabilitation services to address the above impairments to restore functional capacity.    Thank you for referring this patient to [place], PT       Minutes   Time Based CPT  Physical Performance test, Therapeutic Exercise, Therapeutic Activities, NM Re-Education, Manual Therapy, Gait Training, Massage, Aquatic Therapy, Canalith Repositioning, Iontophoresis, Ultrasound, Orthotic fitting/training, Prosthetic fitting 40   Service Based (untimed) CPT  PT/OT evaluation, PT/OT re-eval, E-stim-unattended, Mechanical traction, Vasopneumatic device    Unbilled time (rest, etc)        Total Treatment Time 40

## 2020-05-24 ENCOUNTER — Other Ambulatory Visit: Payer: Self-pay | Admitting: Primary Care

## 2020-05-24 NOTE — Telephone Encounter (Signed)
LOV  12.21.21  NOV  12.2.22  LABS  12.17.21

## 2020-06-07 ENCOUNTER — Ambulatory Visit: Payer: BLUE CROSS/BLUE SHIELD | Admitting: Rehabilitative and Restorative Service Providers"

## 2020-06-11 ENCOUNTER — Ambulatory Visit: Payer: BLUE CROSS/BLUE SHIELD | Admitting: Rehabilitative and Restorative Service Providers"

## 2020-07-16 ENCOUNTER — Ambulatory Visit
Payer: BLUE CROSS/BLUE SHIELD | Attending: Sports Medicine | Admitting: Rehabilitative and Restorative Service Providers"

## 2020-07-16 DIAGNOSIS — M7541 Impingement syndrome of right shoulder: Secondary | ICD-10-CM | POA: Insufficient documentation

## 2020-07-16 DIAGNOSIS — M170 Bilateral primary osteoarthritis of knee: Secondary | ICD-10-CM | POA: Insufficient documentation

## 2020-07-16 NOTE — Progress Notes (Signed)
[facility] LE EVALUATION      Diagnosis: Bilateral Knee OA, R worse than L, L has been worsening  Onset date:  5 years ago, Chronic  Date of surgery: Right Knee Meniscectomy about 3 years ago     Most recent series of knee injections got him about a month of relief. The cold weather makes it worse. He has been trying to do what he can to put off having to have a total knee replacement.       History  Lance Peters is a 60 y.o. male who is present today for bilateral knee care.   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, Pull, Engineer, materials, Heavy Lifting, Awkward Positions, Prolonged Standing, Fine Motor Manipulation, Vibration exposure, Tool Use, Repeated grasping  Stresses/physical demands of home: [place], Housekeeping and Gardening/Yard Work  Sport(s): Fishing , Location manager and Walking  Other: NA  Diagnostic tests: Per report, reviewed, X-ray    Symptom location: Medial, Lateral and Anterior, bilateral  Relevant symptoms:  Aching, Throbbing, Pain , Decreased ROM, Decreased strength  Symptom frequency: Constant  Symptom intensity:  (0 - 10 scale): Now 3 Best 3 Worst 10   Night Pain: Yes   Restful sleep:   No- has been taking Advil PM to sleep  Morning Pain/Stiffness: Increased   Symptoms worsen with: Squatting, Kneeling, Stairs, Weight bearing, Running, Sitting, Standing, Walking, Bending  Symptoms improve with: Rest, Ice, Medication  Assistive device:  none- has off loader braces but they are cumbersome and hot in the summer so he has not been using them.   Patients goals for therapy: Reduce pain, Increase ROM / flexibility, Increase strength, Achieve independence with home program for self care / condition management, Return to work, Return to sports / activities      Objective  Observation: Unremarkable  Gait:  FWB   Lumbar Screen:  Within Functional Limits  Neurologic:  Sensation:  LE  Light touch, Intact to gross screen  Myotomes:  LE Intact to gross screen    Palpation:  tenderness @ localized- joint line   Incision:  NA      ROM/Strength  HIP LEFT RIGHT STRENGTH    PROM AROM PROM AROM Left Right   Flexion         Extension     4 4   Abduction     4 4   IR         ER     4 4   Adduction                        KNEE LEFT RIGHT STRENGTH    PROM AROM PROM AROM Left Right   Flexion 135  135  4+ 4+   Extension Lack 5  Lack 5   4 4                   LE Flexibility  Quadricep:  Fair  Hamstring:  Fair     Special Tests:   Hip NA   Knee Valgus,   negative  Varus,  negative  Lachman,  negative  Posterior Drawer,  negative   Ankle NA          Functional:  Stand more than 30 minutes, - able, with pain  to perform.  Walk more than 30 minutes - able, with pain to perform.  Ascend stairs with reciprocal gait - able, with pain to perform.  Descend stairs with reciprocal gait - able, with pain to perform.  Return to work with restrictions - able, with pain to perform.  Return to work full duty - able, with pain to perform.  Return to sport/activities - able, with pain  to perform.    Assessment:    Findings consistent with 60 y.o. male with bilateral knee OA with pain, ROM limitations, strength limitations, functional limitations    Personal factors affecting treatment/recovery:   none identified  Comorbidities affecting treatment/recovery:   none noted  Clinical presentation:   stable  Patient complexity:     low level as indicated by above stability of condition, personal factors, environmental factors and comorbidities in addition to patient symptom presentation and impairments found on physical exam.    Prognosis:  Good   Contraindications/Precautions/Limitation:  Per diagnosis  Short Term Goals: (4 week(s)): Decrease c/o max pain to < 4/10 and Minimal assistance with HEP/ education  concepts  Long Term Goals: (4 month(s)): Pain/Sx 0 - minimal, ROM/ flexibility WNL , Restoration of functional strength, Independent with HEP/education , Functional return to ADLs / activities without limitations     Treatment Plan:   Options / plan reviewed with patient:  Yes  Freq 1-2 times per week/month with adjusted visit frequency per patient status/protocol for 4 month(s)    Treatment plan inclusive of:   Exercise: AROM, AAROM, PROM, Stretching, Strengthening, Progressive Resistive   Manual Techniques:  Joint mobilization, Soft tissue release/massage   Modalities:  Cold pack, Functional/Therapeutic activites per flowsheet, Moist heat, Ther Exercise per flowsheet   Functional: Proprioception/Dynamic stability, Functional rehab, Work Public house manager, Clinical cytogeneticist, Personnel officer, Balance , Endurance training    Thank you for referring this patient to [facility].    Marta Antu, PT    Heel prop 3 min    QS 3sx10   SLR x10   Bridge x10    S/L hip abd x10   Clamshell x10   Prone hip ext x10                           Minutes   Time Based CPT  Physical Performance test, Therapeutic Exercise, Therapeutic Activities, NM Re-Education, Manual Therapy, Gait Training, Massage, Aquatic Therapy, Canalith Repositioning, Iontophoresis, Ultrasound, Orthotic fitting/training, Prosthetic fitting 30   Service Based (untimed) CPT  PT/OT evaluation, PT/OT re-eval, E-stim-unattended, Mechanical traction, Vasopneumatic device 15   Unbilled time (rest, etc)        Total Treatment Time 45

## 2020-07-24 ENCOUNTER — Ambulatory Visit: Payer: BLUE CROSS/BLUE SHIELD | Admitting: Rehabilitative and Restorative Service Providers"

## 2020-07-24 ENCOUNTER — Ambulatory Visit: Payer: BLUE CROSS/BLUE SHIELD | Admitting: Sports Medicine

## 2020-07-24 VITALS — BP 139/89 | Ht 70.0 in | Wt 180.0 lb

## 2020-07-24 DIAGNOSIS — M170 Bilateral primary osteoarthritis of knee: Secondary | ICD-10-CM

## 2020-07-24 MED ORDER — BETAMETHASONE ACET & SOD PHOS 6 (3-3) MG/ML IJ SUSP *I*
12.0000 mg | Freq: Once | INTRAMUSCULAR | Status: AC | PRN
Start: 2020-07-24 — End: 2020-07-24
  Administered 2020-07-24: 12 mg via INTRA_ARTICULAR

## 2020-07-24 MED ORDER — LIDOCAINE HCL 1 % IJ SOLN *I*
1.5000 mL | Freq: Once | INTRAMUSCULAR | Status: AC | PRN
Start: 2020-07-24 — End: 2020-07-24
  Administered 2020-07-24: 1.5 mL via INTRA_ARTICULAR

## 2020-07-24 MED ORDER — LIDOCAINE HCL 1 % IJ SOLN *I*
0.5000 mL | Freq: Once | INTRAMUSCULAR | Status: AC | PRN
Start: 2020-07-24 — End: 2020-07-24
  Administered 2020-07-24: .5 mL via INTRA_ARTICULAR

## 2020-07-24 MED ORDER — LIDOCAINE HCL 1 % IJ SOLN *I*
1.0000 mL | Freq: Once | INTRAMUSCULAR | Status: AC | PRN
Start: 2020-07-24 — End: 2020-07-24
  Administered 2020-07-24: 1 mL via INTRA_ARTICULAR

## 2020-07-24 MED ORDER — ETHYL CHLORIDE EX AERO (MULTIPLE PATIENTS) *I*
1.0000 | INHALATION_SPRAY | Freq: Once | CUTANEOUS | Status: AC | PRN
Start: 2020-07-24 — End: 2020-07-24
  Administered 2020-07-24: 1 via TOPICAL

## 2020-07-24 NOTE — Progress Notes (Signed)
Chief Complaint   Patient presents with    Right Knee - Follow-up    Left Knee - Follow-up       History:  Lance Peters is a 60 y.o. male who presents today with bilateral knee pain.        Had Orthovisc injections 3 months ago. Orthovisc went 'okay', doesn't think there was a ton of relief following these injections.  He just recently started physical therapy for bilateral knee pain, just had a session right before today's visit.  He says the pain in the right knee is worse than the left.  Pain is fluctuating in severity.  When it is severe it can be 10 out of 10.  He describes symptoms as aching, unstable, stiff, sharp.    He is requesting corticosteroid injections in the knees again today.  He says that he may consider knee surgery in the future if needed if symptoms do not improve.    Answers for HPI/ROS submitted by the patient on 07/20/2020  What is your goal for today's visit?: Cortizone injections both knees  Was this the result of an injury?: No  What is your pain level?: 10/10  Please describe the quality of your pain: : aching, discomfort, instability, sharp, shooting, sore, stiffness, tenderness, throbbing  What diagnostic workup have you had for this condition?: MRI scan, X-ray  What treatments have you tried for this condition?: acetaminophen, corticosteroids, heat, ice, NSAIDs, physical therapy, viscosupplementation  Progression since onset: : gradually worsening  Is this a work related condition? : No  Current work status: : usual activities  Fever: No  Chills: No  Tingling: No           Pain    07/24/20 1533 07/24/20 1534   PainSc:   4   4       Past medical history, past surgical history, medications, allergies, family history, social history, and review of systems were reviewed today and have been documented separately in this encounter.     Past Medical History:   Diagnosis Date    Arthritis     elbows and shoulders     Chest pain, unspecified     Depression 05/23/2014    Neuromuscular disorder      elbows, shoulders    Unspecified essential hypertension 06/02/2013         Past Surgical History:   Procedure Laterality Date    HAND SURGERY      trigger finger release     PR ARTHRS KNE SURG W/MENISCECTOMY MED/LAT W/SHVG Right 07/16/2016    Procedure: KNEE ARTHROSCOPY WITH PARTIAL MEDIAL MENISCECTOMY;  Surgeon: Jannifer Franklin, MD;  Location: [facility];  Service: Orthopedics    PR COLONOSCOPY FLX DX W/COLLJ SPEC WHEN PFRMD N/A 04/30/2020    Procedure: COLONOSCOPY;  Surgeon: Harlin Rain, MD;  Location: [facility];  Service: GI       Current Outpatient Medications   Medication    amLODIPine (NORVASC) 10 mg tablet    DULoxetine (CYMBALTA) 30 mg DR capsule    benazepril (LOTENSIN) 10 mg tablet    sertraline (ZOLOFT) 50 mg tablet    atorvastatin (LIPITOR) 20 MG tablet    EPINEPHrine 0.3 mg/0.3 mL auto-injector    aspirin 81 MG tablet    acetaminophen (TYLENOL) 325 MG tablet     No current facility-administered medications for this visit.       Objective:  Vitals:    07/24/20 1533   BP: 139/89   Weight: 81.6  kg (180 lb)   Height: 1.778 m (5\' 10" )       Physical Examination:  Constitutional: well appearing in no apparent distress  Knees: Trace effusions bilaterally.  There is no warmth or redness of either knee.  There is mild tenderness along the joint lines bilaterally.  There is crepitus.        Imaging:  Imaging studies were personally reviewed by me and demonstrate the following:  X-rays of bilateral knees including 4 views of each from February 03, 2019 show moderate joint space narrowing of the right medial compartment and mild narrowing of the left medial compartment consistent with degenerative changes of the knees.        Assessment/Plan:    60 year old male with chronic bilateral knee pain consistent with moderate knee osteoarthritis, right worse than left.        ICD-10-CM ICD-9-CM   1. Primary osteoarthritis of both knees  M17.0 715.16     We discussed management options today.       Plan:  -He would like to repeat corticosteroid injections.  These were repeated bilaterally and documented in procedure note.  -He will continue with physical therapy.  -I did not recommend repeating viscosupplementation in the future again given that he had no improvement following recent Orthovisc.  -Could consider updating imaging as needed at a future visit and considering referral for surgical consultation as needed if he fails to have improvement with conservative management.    Follow-up in 3 months.      Gonzella Lex, D.O.  Assistant Professor of [place] [employer] [place]

## 2020-07-24 NOTE — Progress Notes (Signed)
[facility]opedic Sports/Spine Rehabilitation  PT Treatment Note    Todays Date: 07/24/2020    Name: Lance Peters  DOB: Oct 03, 1960  Referring Physician: Lestine Box*  Diagnosis:   1. Bilateral primary osteoarthritis of knee         Visit #: 2    Subjective:  Pain Assessment: 3  Pt reports no significant changes in pain or function since previous treatment session.  He has been feeling about the same, 3-4/10 at rest that will spike to a 10 at any given moment throughout the day. Has apt with MD after this today to get cortisone injections.      Objective:  ROM -  Bilateral, Knee,   Strength - Therapeutic Exercises per flowsheet  Function: - Unchanged- continues to be limited in prolonged activities and has pain spikes throughout the day if he pivots or turns the wrong way.   Education:  Updated HEP, Verbal cues for ther ex, Joint ed concepts    Objective       Bike  5 min    Heel prop 3 min    QS 5sx20   SLR 3x10   Bridge 3sx20    S/L hip abd x20   Clamshell x20   Prone hip ext x20   Standing DL calf raise x20   Mini squat x20   SLS 10sx3           Treatment:  Ther Exercise per flowsheet    Assessment:   Patient was able to complete his strengthening program as documented above, no increase in pain or problems. He is seeing MD after this appointment.        Plan of Care:  Continue per Plan of care -  As written; Patient would benefit from skilled rehabilitation services to address the above impairments to restore functional capacity.    Thank you for referring this patient to [place], PT       Minutes   Time Based CPT  Physical Performance test, Therapeutic Exercise, Therapeutic Activities, NM Re-Education, Manual Therapy, Gait Training, Massage, Aquatic Therapy, Canalith Repositioning, Iontophoresis, Ultrasound, Orthotic fitting/training, Prosthetic fitting 45   Service Based (untimed) CPT  PT/OT evaluation,  PT/OT re-eval, E-stim-unattended, Mechanical traction, Vasopneumatic device    Unbilled time (rest, etc)        Total Treatment Time 45

## 2020-07-24 NOTE — Procedures (Signed)
Large Joint Aspiration/Injection Procedure: L knee intra - articular    Date/Time: 07/24/2020  3:40 PM EDT  Consent given by: patient  Site marked: site marked  Timeout: Immediately prior to procedure a time out was called to verify the correct patient, procedure, equipment, support staff and site/side marked as required     Procedure Details    Location: knee - L knee intra - articular  Preparation: The site was prepped using the usual aseptic technique.  Anesthetics administered: 1 spray ethyl chloride; 1.5 mL lidocaine HCL 1 %; 1 mL lidocaine HCL 1 %; 0.5 mL lidocaine HCL 1 %  Intra-Articular Steroids administered: 12 mg betamethasone acetate-betamethasone sodium phosphate 6 (3-3) mg/mL  Dressing:  A dry, sterile dressing was applied.  Patient tolerance: patient tolerated the procedure well with no immediate complications

## 2020-07-24 NOTE — Procedures (Signed)
Large Joint Aspiration/Injection Procedure: R knee intra - articular    Date/Time: 07/24/2020  3:40 PM EDT  Consent given by: patient  Site marked: site marked  Timeout: Immediately prior to procedure a time out was called to verify the correct patient, procedure, equipment, support staff and site/side marked as required     Procedure Details    Location: knee - R knee intra - articular  Preparation: The site was prepped using the usual aseptic technique.  Anesthetics administered: 1 spray ethyl chloride; 1.5 mL lidocaine HCL 1 %; 0.5 mL lidocaine HCL 1 %; 1 mL lidocaine HCL 1 %  Intra-Articular Steroids administered: 12 mg betamethasone acetate-betamethasone sodium phosphate 6 (3-3) mg/mL  Dressing:  A dry, sterile dressing was applied.  Patient tolerance: patient tolerated the procedure well with no immediate complications

## 2020-08-01 ENCOUNTER — Other Ambulatory Visit: Payer: Self-pay | Admitting: Primary Care

## 2020-08-01 ENCOUNTER — Encounter: Payer: Self-pay | Admitting: Rehabilitative and Restorative Service Providers"

## 2020-08-01 NOTE — Telephone Encounter (Signed)
lov 03/27/20  fuv 03/26/21  Labs 03/23/20, future ordered

## 2020-08-02 ENCOUNTER — Ambulatory Visit: Payer: BLUE CROSS/BLUE SHIELD | Admitting: Rehabilitative and Restorative Service Providers"

## 2020-08-02 DIAGNOSIS — M170 Bilateral primary osteoarthritis of knee: Secondary | ICD-10-CM

## 2020-08-02 NOTE — Progress Notes (Signed)
[facility] Orthopedic Sports/Spine Rehabilitation  PT Treatment Note    Todays Date: 08/02/2020    Name: Lance Peters  DOB: Apr 30, 1960  Referring Physician: Lestine Box*  Diagnosis:   1. Bilateral primary osteoarthritis of knee         Visit #: 3    Subjective:  Pain Assessment: 0  Pt reports an improvement in pain intensity and pain frequency since previous treatment session.  He had his injections done with the MD and he is feeling much better.      Objective:  ROM -  Bilateral, Knee,   Strength - Therapeutic Exercises per flowsheet  Function: - Unchanged- continues to be limited in prolonged activities and has pain spikes throughout the day if he pivots or turns the wrong way.   Education:  Updated HEP, Verbal cues for ther ex, Joint ed concepts    Objective       Bike  5 min    Heel prop 3 min    QS 5sx20   SLR 3x10   Bridge 3sx30    S/L hip abd x20   Clamshell x20   Prone hip ext x30   Standing DL calf raise x20   Mini squat x20   SLS 10sx3   Anterior clock step x10 B       Treatment:  Ther Exercise per flowsheet    Assessment:   Patient was able to complete his strengthening program as documented above, no increase in pain or problems. Discussed activity modifications and what to avoid to not overdo it and answered some questions about TKA surgery.        Plan of Care:  Continue per Plan of care -  As written; Patient would benefit from skilled rehabilitation services to address the above impairments to restore functional capacity.    Thank you for referring this patient to [place], PT       Minutes   Time Based CPT  Physical Performance test, Therapeutic Exercise, Therapeutic Activities, NM Re-Education, Manual Therapy, Gait Training, Massage, Aquatic Therapy, Canalith Repositioning, Iontophoresis, Ultrasound, Orthotic fitting/training, Prosthetic fitting 45   Service Based (untimed) CPT  PT/OT evaluation,  PT/OT re-eval, E-stim-unattended, Mechanical traction, Vasopneumatic device    Unbilled time (rest, etc)        Total Treatment Time 45

## 2020-08-06 ENCOUNTER — Encounter: Payer: Self-pay | Admitting: Rehabilitative and Restorative Service Providers"

## 2020-08-09 ENCOUNTER — Ambulatory Visit
Payer: BLUE CROSS/BLUE SHIELD | Attending: Sports Medicine | Admitting: Rehabilitative and Restorative Service Providers"

## 2020-08-09 ENCOUNTER — Ambulatory Visit: Payer: BLUE CROSS/BLUE SHIELD | Admitting: Rehabilitative and Restorative Service Providers"

## 2020-08-09 DIAGNOSIS — M170 Bilateral primary osteoarthritis of knee: Secondary | ICD-10-CM | POA: Insufficient documentation

## 2020-08-09 DIAGNOSIS — M7541 Impingement syndrome of right shoulder: Secondary | ICD-10-CM | POA: Insufficient documentation

## 2020-08-09 NOTE — Progress Notes (Signed)
[facility] Orthopedic Sports/Spine Rehabilitation  PT Treatment Note    Todays Date: 08/09/2020    Name: Lance Peters  DOB: 1960-11-29  Referring Physician: Lestine Box*  Diagnosis:   No diagnosis found.    Visit #: 4    Subjective:  Pain Assessment: 0  Pt reports an improvement in pain intensity and pain frequency since previous treatment session.  He had his injections done with the MD and he is feeling much better.      Objective:  ROM -  Bilateral, Knee,   Strength - Therapeutic Exercises per flowsheet  Function: - Unchanged- continues to be limited in prolonged activities and has pain spikes throughout the day if he pivots or turns the wrong way.   Education:  Updated HEP, Verbal cues for ther ex, Joint ed concepts    Objective       Bike  5 min    Heel prop 3 min    QS 5sx20   SLR 3x10   Bridge 3sx30    S/L hip abd x20   Clamshell x20   Prone hip ext x30   Standing DL calf raise x20   Mini squat x20   SLS 10sx3   Anterior clock step x10 B   Slider P x10                Treatment:  Ther Exercise per flowsheet    Assessment:   Patient was able to complete his strengthening program as documented above, no increase in pain or problems. Discussed not over doing it since he is feeling better since the injections.        Plan of Care:  Continue per Plan of care -  As written; Patient would benefit from skilled rehabilitation services to address the above impairments to restore functional capacity.    Thank you for referring this patient to [place], PT       Minutes   Time Based CPT  Physical Performance test, Therapeutic Exercise, Therapeutic Activities, NM Re-Education, Manual Therapy, Gait Training, Massage, Aquatic Therapy, Canalith Repositioning, Iontophoresis, Ultrasound, Orthotic fitting/training, Prosthetic fitting 45   Service Based (untimed) CPT  PT/OT evaluation, PT/OT re-eval, E-stim-unattended,  Mechanical traction, Vasopneumatic device    Unbilled time (rest, etc)        Total Treatment Time 45

## 2020-08-16 ENCOUNTER — Ambulatory Visit: Payer: BLUE CROSS/BLUE SHIELD | Admitting: Rehabilitative and Restorative Service Providers"

## 2020-08-16 DIAGNOSIS — M170 Bilateral primary osteoarthritis of knee: Secondary | ICD-10-CM

## 2020-08-16 NOTE — Progress Notes (Signed)
[facility] Orthopedic Sports/Spine Rehabilitation  PT Treatment Note    Todays Date: 08/16/2020    Name: Lance Peters  DOB: Nov 27, 1960  Referring Physician: Lestine Box*  Diagnosis:   1. Bilateral primary osteoarthritis of knee         Visit #: 5    Subjective:  Pain Assessment: 0  Pt reports an improvement in pain intensity and pain frequency since previous treatment session.  He continues to be doing well since the recent injections, has been careful not to overdo it and so far it has been going well.      Objective:  ROM -  Bilateral, Knee,   Strength - Therapeutic Exercises per flowsheet  Function: - Unchanged- continues to be limited in prolonged activities and has pain spikes throughout the day if he pivots or turns the wrong way.   Education:  Updated HEP, Verbal cues for ther ex, Joint ed concepts    Objective       Bike  5 min    Heel prop 3 min    QS 5sx20   SLR 3x10   Bridge 3sx30    S/L hip abd x20   Clamshell x20   Prone hip ext x30   Standing DL calf raise x30   Mini squat x30   SLS 10sx3   Anterior clock step x10 B   Slider P x10    Step up 4in x10            Treatment:  Ther Exercise per flowsheet    Assessment:   Patient was able to complete his strengthening program as documented above, no increase in pain or problems. Continues to be doing well since recent injections.        Plan of Care:  Continue per Plan of care -  As written; Patient would benefit from skilled rehabilitation services to address the above impairments to restore functional capacity.    Thank you for referring this patient to [place], PT       Minutes   Time Based CPT  Physical Performance test, Therapeutic Exercise, Therapeutic Activities, NM Re-Education, Manual Therapy, Gait Training, Massage, Aquatic Therapy, Canalith Repositioning, Iontophoresis, Ultrasound, Orthotic fitting/training, Prosthetic fitting 45   Service  Based (untimed) CPT  PT/OT evaluation, PT/OT re-eval, E-stim-unattended, Mechanical traction, Vasopneumatic device    Unbilled time (rest, etc)        Total Treatment Time 45

## 2020-08-21 ENCOUNTER — Telehealth: Payer: Self-pay | Admitting: Primary Care

## 2020-08-21 MED ORDER — DOXYCYCLINE HYCLATE 100 MG PO CAPS *I*
200.0000 mg | ORAL_CAPSULE | Freq: Once | ORAL | 0 refills | Status: AC
Start: 2020-08-21 — End: 2020-08-21

## 2020-08-21 NOTE — Telephone Encounter (Signed)
Doxycycline ordered.  2 pills, take both pills at once.  This is a preventative measure.  If the tick was carrying Lyme disease, this will prevent it from infecting him.

## 2020-08-21 NOTE — Telephone Encounter (Signed)
He had a tick on him, removed it yesterday, was on him more than 24 hours. It was attached, but not encapsuled.   No redness or swelling.   Right thigh.   ? Any advice? He is asking if he needs to take an antibiotic.

## 2020-08-21 NOTE — Telephone Encounter (Signed)
Spoke to [employer], told him per Dr. Kelle Darting:     Doxycycline ordered.  2 pills, take both pills at once.  This is a preventative measure.  If the tick was carrying Lyme disease, this will prevent it from infecting him.  Lance Peters understood and will go pick up the medication today and take it.

## 2020-08-21 NOTE — Telephone Encounter (Signed)
Left message for pt to call the office.

## 2020-08-30 ENCOUNTER — Ambulatory Visit: Payer: BLUE CROSS/BLUE SHIELD | Admitting: Rehabilitative and Restorative Service Providers"

## 2020-08-30 DIAGNOSIS — M170 Bilateral primary osteoarthritis of knee: Secondary | ICD-10-CM

## 2020-08-30 NOTE — Progress Notes (Signed)
[facility] Orthopedic Sports/Spine Rehabilitation  PT Treatment Note    Todays Date: 08/30/2020    Name: Lance Peters  DOB: Aug 26, 1960  Referring Physician: Lestine Box*  Diagnosis:   1. Bilateral primary osteoarthritis of knee         Visit #: 6    Subjective:  Pain Assessment: 0  Pt reports an improvement in pain intensity and pain frequency since previous treatment session.  He continues to be doing well since the recent injections, he reports occasional pain at the anterior medial knee that increases when walking down steep grades at work or climbing stairs.     Objective:  ROM -  Bilateral, Knee,   Strength - Therapeutic Exercises per flowsheet  Function: - Unchanged- continues to be limited in prolonged activities and has pain spikes throughout the day if he pivots or turns the wrong way.   Education:  Updated HEP, Verbal cues for ther ex, Joint ed concepts    Objective       Bike  5 min    Heel prop 3 min    QS 5sx20   SLR 3x10   Bridge 3sx30    S/L hip abd x20   Clamshell x20   Prone hip ext x30   Standing DL calf raise x30   Mini squat x30   SLS 10sx3   Anterior clock step add lateral x10 B   Anterior Slider x10  BL   Step up 4in x10    Deadlift [id number]       Treatment:  Ther Exercise per flowsheet    Assessment:   Patient was able to complete his strengthening program as documented above, no increase in pain or problems. Pt reports improvements in medial knee symptoms following session. Updates pt's HEP.       Plan of Care:  Continue per Plan of care -  As written; Patient would benefit from skilled rehabilitation services to address the above impairments to restore functional capacity.    Thank you for referring this patient to [facility] [place] , [place]     Minutes   Time Based CPT  Physical Performance test, Therapeutic Exercise, Therapeutic Activities, NM Re-Education, Manual Therapy, Gait Training, Massage, Aquatic  Therapy, Canalith Repositioning, Iontophoresis, Ultrasound, Orthotic fitting/training, Prosthetic fitting 45 min thr ex   Service Based (untimed) CPT  PT/OT evaluation, PT/OT re-eval, E-stim-unattended, Mechanical traction, Vasopneumatic device    Unbilled time (rest, etc)        Total Treatment Time 45 thr ex

## 2020-09-06 ENCOUNTER — Ambulatory Visit: Payer: BLUE CROSS/BLUE SHIELD | Admitting: Rehabilitative and Restorative Service Providers"

## 2020-09-13 ENCOUNTER — Other Ambulatory Visit: Payer: Self-pay | Admitting: Primary Care

## 2020-09-13 ENCOUNTER — Ambulatory Visit
Payer: BLUE CROSS/BLUE SHIELD | Attending: Sports Medicine | Admitting: Rehabilitative and Restorative Service Providers"

## 2020-09-13 DIAGNOSIS — M7541 Impingement syndrome of right shoulder: Secondary | ICD-10-CM | POA: Insufficient documentation

## 2020-09-13 DIAGNOSIS — M170 Bilateral primary osteoarthritis of knee: Secondary | ICD-10-CM | POA: Insufficient documentation

## 2020-09-13 NOTE — Progress Notes (Signed)
[facility] Orthopedic Sports/Spine Rehabilitation  PT Treatment Note    Todays Date: 09/13/2020    Name: Lance Peters  DOB: 1960-10-06  Referring Physician: Lestine Box*  Diagnosis:   1. Bilateral primary osteoarthritis of knee         Visit #: 7    Subjective:  Pain Assessment: 0  Pt reports an improvement in pain intensity and pain frequency since previous treatment session.  His knees continue to be doing well overall. Has not had any set back or big issues with pain since last session.      Objective:  ROM -  Bilateral, Knee,   Strength - Therapeutic Exercises per flowsheet  Function: - Unchanged- continues to be limited in prolonged activities and has pain spikes throughout the day if he pivots or turns the wrong way.   Education:  Updated HEP, Verbal cues for ther ex, Joint ed concepts    Objective       Bike  5 min    Heel prop 3 min    QS 5sx20   SLR 3x10   Bridge 3sx30    S/L hip abd x30   Clamshell x30   Prone hip ext x30   Standing SL calf raise x30   Mini squat x30 2-5# DB   Flatback bendover x30 2-5# DB   SLS 10sx3   Anterior clock step  x20 B 2-5# DB   Lateral clock step x20 B 2-5# DB   Slider A, L, P x10 B   Step up 4in x10    Step dn 2in x10        Treatment:  Ther Exercise per flowsheet    Assessment:   Patient was able to complete his strengthening program as documented above, he struggles with eccentric step down work. Discussed how to modify to work on this at home and he understands.        Plan of Care:  Continue per Plan of care -  As written; Patient would benefit from skilled rehabilitation services to address the above impairments to restore functional capacity.    Thank you for referring this patient to [place], PT , SPT     Minutes   Time Based CPT  Physical Performance test, Therapeutic Exercise, Therapeutic Activities, NM Re-Education, Manual Therapy, Gait Training, Massage, Aquatic  Therapy, Canalith Repositioning, Iontophoresis, Ultrasound, Orthotic fitting/training, Prosthetic fitting 45 min thr ex   Service Based (untimed) CPT  PT/OT evaluation, PT/OT re-eval, E-stim-unattended, Mechanical traction, Vasopneumatic device    Unbilled time (rest, etc)        Total Treatment Time 45 thr ex

## 2020-09-14 MED ORDER — DULOXETINE HCL 30 MG PO CPEP *I*
30.0000 mg | DELAYED_RELEASE_CAPSULE | Freq: Every day | ORAL | 5 refills | Status: DC
Start: 2020-09-14 — End: 2021-03-01

## 2020-09-14 NOTE — Telephone Encounter (Signed)
LOV  12.21.21  NOV  12.20.22  LABS  12.17.21

## 2020-09-16 ENCOUNTER — Ambulatory Visit
Admission: AD | Admit: 2020-09-16 | Discharge: 2020-09-16 | Disposition: A | Discharge status: Home / Self Care | Payer: BLUE CROSS/BLUE SHIELD | Source: Ambulatory Visit

## 2020-09-16 DIAGNOSIS — J019 Acute sinusitis, unspecified: Secondary | ICD-10-CM

## 2020-09-16 DIAGNOSIS — B9689 Other specified bacterial agents as the cause of diseases classified elsewhere: Secondary | ICD-10-CM

## 2020-09-16 MED ORDER — DOXYCYCLINE HYCLATE 100 MG PO CAPS *I*
100.0000 mg | ORAL_CAPSULE | Freq: Two times a day (BID) | ORAL | 0 refills | Status: AC
Start: 2020-09-16 — End: 2020-09-23

## 2020-09-16 MED ORDER — BENZONATATE 200 MG PO CAPS *I*
200.0000 mg | ORAL_CAPSULE | Freq: Three times a day (TID) | ORAL | 0 refills | Status: DC | PRN
Start: 2020-09-16 — End: 2021-03-26

## 2020-09-16 NOTE — UC Provider Note (Signed)
History     Chief Complaint   Patient presents with    URI     Lance Peters is a 60 y.o. male with a history as per below who presents with 10 days of nasal congestion, sinus headache, productive cough, pharyngitis.  He has been taking Mucinex with positive effect.  He states he had COVID in September, he has taken 3 at home COVID test which have all been negative, he is fully vaccinated for COVID.  He denies fevers, chills, difficulty with speech or swallow, nausea, vomiting, diarrhea, history of asthma, tobacco usage, recent, shortness of breath, chest pain.      History provided by:  Patient  Language interpreter used: No        Medical/Surgical/Family History     Past Medical History:   Diagnosis Date    Arthritis     elbows and shoulders     Chest pain, unspecified     Depression 05/23/2014    Neuromuscular disorder     elbows, shoulders    Unspecified essential hypertension 06/02/2013        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            Past Surgical History:   Procedure Laterality Date    HAND SURGERY      trigger finger release     PR ARTHRS KNE SURG W/MENISCECTOMY MED/LAT W/SHVG Right 07/16/2016    Procedure: KNEE ARTHROSCOPY WITH PARTIAL MEDIAL MENISCECTOMY;  Surgeon: Jannifer Franklin, MD;  Location: [facility];  Service: Orthopedics    PR COLONOSCOPY FLX DX W/COLLJ SPEC WHEN PFRMD N/A 04/30/2020    Procedure: COLONOSCOPY;  Surgeon: Harlin Rain, MD;  Location: [place];  Service: GI     Family History   Problem Relation Age of Onset    Colon polyps Mother     Cancer Mother      Colon cancer Neg Hx           Social History     Tobacco Use    Smoking status: Never Smoker    Smokeless tobacco: Never Used   Substance Use Topics    Alcohol use: No     Alcohol/week: 1.0 standard drink     Types: 1 Cans of beer per week     Comment: abstinent since april 2018    Drug use: No     Living Situation     Questions Responses    Patient lives with     Homeless     Caregiver for other family member     External Services     Employment     Domestic Violence Risk                 Review of Systems   Review of Systems   Constitutional: Negative for appetite change, chills, diaphoresis, fatigue and fever.   HENT: Positive for congestion, ear pain, sinus pressure, sinus pain and sore throat. Negative for rhinorrhea.    Respiratory: Positive for cough.  Negative for chest tightness, shortness of breath and wheezing.    Cardiovascular: Negative for chest pain and palpitations.   Gastrointestinal: Negative for abdominal pain, diarrhea, nausea and vomiting.   Musculoskeletal: Negative for arthralgias and myalgias.   Skin: Negative for rash.   Allergic/Immunologic: Negative for environmental allergies and food allergies.   Neurological: Positive for headaches. Negative for dizziness.   Hematological: Negative for adenopathy. Does not bruise/bleed easily.       Physical Exam   Triage Vitals  Triage Start: Start, (09/16/20 1446)   First Recorded BP: 136/84, Resp: 18, Temp: 36.8 C (98.2 F) Oxygen Therapy SpO2: 98 %, Heart Rate: 73, (09/16/20 1449)  .      Physical Exam  Vitals and nursing note reviewed.   Constitutional:       General: He is not in acute distress.     Appearance: Normal appearance. He is normal weight. He is not ill-appearing or toxic-appearing.   HENT:      Head: Normocephalic and atraumatic.      Right Ear: Ear canal normal. A middle ear effusion is present.      Left Ear: Ear canal normal. A middle ear effusion is present.      Nose:      Right Turbinates: Enlarged and swollen.      Left  Turbinates: Enlarged and swollen.      Right Sinus: Maxillary sinus tenderness present.      Left Sinus: Maxillary sinus tenderness present.      Mouth/Throat:      Mouth: Mucous membranes are moist.      Pharynx: Oropharynx is clear. No posterior oropharyngeal erythema.      Tonsils: No tonsillar exudate. 0 on the right. 0 on the left.   Cardiovascular:      Rate and Rhythm: Normal rate and regular rhythm.      Pulses: Normal pulses.      Heart sounds: Normal heart sounds.   Pulmonary:      Effort: Pulmonary effort is normal.      Breath sounds: Normal breath sounds.   Musculoskeletal:         General: Normal range of motion.      Cervical back: Normal range of motion and neck supple. No rigidity or tenderness.   Lymphadenopathy:      Cervical: No cervical adenopathy.   Skin:     General: Skin is warm.      Capillary Refill: Capillary refill takes less than 2 seconds.   Neurological:      General: No focal deficit present.      Mental Status: He is alert.   Psychiatric:         Mood and Affect: Mood normal.          Medical Decision Making        Medical Decision Making  Assessment:    Patient is a 60 year old male who presents with acute bacterial sinusitis and a productive cough.  He is overall well-appearing, his vitals are stable.  His lungs are CTA without notable wheezes or rhonchi and he is not in any acute respiratory distress.    Differential diagnosis:    Acute bacterial sinusitis  Viral URI  COVID-19, less likely  Bronchitis  Pneumonia, unlikely    Independent Review of: existing labs/imaging and chart/prior records    Plan:   -Will treat as acute bacterial sinusitis with doxycycline given PCN allergy  -Recommend he continue Mucinex and prescribed Tessalon Perles.  Will  defer from prednisone or inhaler at this time given no evidence of wheezing  -Discussed continued supportive measures and return precautions, patient verbalized understanding    Orders Placed This Encounter    doxycycline hyclate  (VIBRAMYCIN) 100 mg capsule    benzonatate (TESSALON) 200 mg capsule       No results found for this or any previous visit (from the past 24 hour(s)).        Final Diagnosis    ICD-10-CM ICD-9-CM   1. Acute bacterial sinusitis  J01.90 461.9    B96.89        Encourage fluids, encourage rest, good hand hygiene.    Use over the counter medications as discussed.    Please start the new medications as below:    Current Discharge Medication List      New Medications    Details Last Dose Given Next Dose Due Script Given?   doxycycline hyclate (VIBRAMYCIN) 100 mg Dose: 100 mg  Take 100 mg by mouth every 12 hours   Quantity 14 capsule, Refill 0  Start date: 09/16/2020, End date: 09/23/2020       Comments: Emergency Encounter            benzonatate (TESSALON) 200 mg Dose: 200 mg  Take 200 mg by mouth 3 times daily as needed for Cough   Quantity 30 capsule, Refill 0  Start date: 09/16/2020                   Please follow up with your physician as below:     Follow-up Information     Go to  Penni Bombard, MD.    Specialties: Primary Care, Internal Medicine  Why: As needed  Contact information:  [address]  [address]  [place] [address]  [phone]             Go to  [facility].    Specialty: Emergency Medicine  Why: If symptoms worsen  Contact information:  [address]  [place] [address]  [phone]                         Discharge Instructions       You are being treated for a sinus infection today with an antibiotic called doxycycline, please take all the provided medication despite resolution of symptoms.    Please use the Tessalon Perles as needed to help suppress your cough so that you may sleep.    Continue take Mucinex to help break up congestion.    Perform warm compresses to the face and use warm steam baths to help break up congestion.    Return for worsening signs of infection including fever, persistent cough, shortness of breath.        Final Diagnosis  Final  diagnoses:   [J01.90, B96.89] Acute bacterial sinusitis (Primary)         Bennett Scrape, NP              Bennett Scrape, NP  09/16/20 1610

## 2020-09-16 NOTE — Discharge Instructions (Signed)
You are being treated for a sinus infection today with an antibiotic called doxycycline, please take all the provided medication despite resolution of symptoms.    Please use the Tessalon Perles as needed to help suppress your cough so that you may sleep.    Continue take Mucinex to help break up congestion.    Perform warm compresses to the face and use warm steam baths to help break up congestion.    Return for worsening signs of infection including fever, persistent cough, shortness of breath.

## 2020-09-16 NOTE — ED Triage Notes (Signed)
Pt reports for 10 days: sinus headache, congestion, cough, sore throat. COVID negative this morning. Vaccinated against [place]. Has tried zyrtec and mucinex with no help. Denies fevers and chills.

## 2020-09-20 ENCOUNTER — Ambulatory Visit: Payer: BLUE CROSS/BLUE SHIELD | Admitting: Rehabilitative and Restorative Service Providers"

## 2020-09-25 ENCOUNTER — Ambulatory Visit: Payer: BLUE CROSS/BLUE SHIELD | Admitting: Primary Care

## 2020-09-25 ENCOUNTER — Ambulatory Visit: Payer: BLUE CROSS/BLUE SHIELD | Admitting: Rehabilitative and Restorative Service Providers"

## 2020-09-25 DIAGNOSIS — M170 Bilateral primary osteoarthritis of knee: Secondary | ICD-10-CM

## 2020-09-25 NOTE — Progress Notes (Signed)
[facility] Orthopedic Sports/Spine Rehabilitation  PT Treatment Note    Todays Date: 09/25/2020    Name: Timon Geissinger  DOB: [birth year]  Referring Physician: Lestine Box*  Diagnosis:   1. Bilateral primary osteoarthritis of knee         Visit #: 8    Subjective:  Pain Assessment: 0  Pt reports an improvement in pain intensity and pain frequency since previous treatment session.  His knees continue to be doing well overall. Has not had any set back or big issues with pain since last session.      Objective:  ROM -  Bilateral, Knee,   Strength - Therapeutic Exercises per flowsheet  Function: - Unchanged- continues to be limited in prolonged activities and has pain spikes throughout the day if he pivots or turns the wrong way.   Education:  Updated HEP, Verbal cues for ther ex, Joint ed concepts    Objective       Bike  5 min    Heel prop    QS 5sx20   SLR 3x10   Bridge 3sx30    S/L hip abd x30   Clamshell x30   Prone hip ext x30   Standing SL calf raise x30   Mini squat x30 2-8# DB   Flatback bendover x30 2-8# DB   SLS 10sx3   Anterior clock step  x20 B 2-8# DB   Lateral clock step x20 B 2-8# DB   Slider A, L, P x10 B   Step up 4in x20    Step dn 2in x20        Treatment:  Ther Exercise per flowsheet    Assessment:   Patient was able to complete his strengthening program as documented above, he struggles with eccentric step down work. Again discussed how to modify to work on this at home and he understands.        Plan of Care:  Continue per Plan of care -  As written; Patient would benefit from skilled rehabilitation services to address the above impairments to restore functional capacity.    Thank you for referring this patient to [place], PT , SPT     Minutes   Time Based CPT  Physical Performance test, Therapeutic Exercise, Therapeutic Activities, NM Re-Education, Manual Therapy, Gait Training, Massage, Aquatic  Therapy, Canalith Repositioning, Iontophoresis, Ultrasound, Orthotic fitting/training, Prosthetic fitting 45 min thr ex   Service Based (untimed) CPT  PT/OT evaluation, PT/OT re-eval, E-stim-unattended, Mechanical traction, Vasopneumatic device    Unbilled time (rest, etc)        Total Treatment Time 45 thr ex

## 2020-09-27 ENCOUNTER — Ambulatory Visit: Payer: BLUE CROSS/BLUE SHIELD | Admitting: Rehabilitative and Restorative Service Providers"

## 2020-10-04 ENCOUNTER — Ambulatory Visit: Payer: BLUE CROSS/BLUE SHIELD | Admitting: Rehabilitative and Restorative Service Providers"

## 2020-10-11 ENCOUNTER — Ambulatory Visit
Payer: BLUE CROSS/BLUE SHIELD | Attending: Sports Medicine | Admitting: Rehabilitative and Restorative Service Providers"

## 2020-10-11 DIAGNOSIS — M170 Bilateral primary osteoarthritis of knee: Secondary | ICD-10-CM | POA: Insufficient documentation

## 2020-10-11 DIAGNOSIS — M7541 Impingement syndrome of right shoulder: Secondary | ICD-10-CM | POA: Insufficient documentation

## 2020-10-11 NOTE — Progress Notes (Signed)
[facility] [place] Orthopedic Sports/Spine Rehabilitation  PT Treatment Note    Todays Date: 10/11/2020    Name: Lance Peters  DOB: 11-24-1960  Referring Physician: Lestine Box*  Diagnosis:   1. Bilateral primary osteoarthritis of knee         Visit #: 9    Subjective:  Pain Assessment: 4  Pt reports an improvement in pain intensity and pain frequency since previous treatment session.  He worked on the hill for 20 hours and this had both of his knees pretty sore that day and wanted to stiffen up on him and then they were still sore yesterday but today he is feeling a little bit better.      Objective:  ROM -  Bilateral, Knee,   Strength - Therapeutic Exercises per flowsheet  Function: - Unchanged- continues to be limited in prolonged activities and has pain spikes throughout the day if he pivots or turns the wrong way.   Education:  Updated HEP, Verbal cues for ther ex, Joint ed concepts    Objective       Bike  5 min    Heel prop    QS 5sx20   SLR 3x10   Bridge 3sx30    S/L hip abd x20 2# AW   Clamshell x30   Prone hip ext x20 2# AW   Standing SL calf raise x20   Mini squat x30 2-10# DB   Flatback bendover x30 2-10# DB   SLS 10sx3   Anterior clock step  x20 B 2-8# DB   Lateral clock step x20 B 2-8# DB   Slider A, L, P x10 B   Step up 4in x20    Step dn 2in x20        Treatment:  Ther Exercise per flowsheet    Assessment:   Patient was able to complete his entire program today as documented above with no increase in his pain, reviewed modifications to his HEP and he understands. He is going away on vacation and then will follow back up with PT when he returns.        Plan of Care:  Continue per Plan of care -  As written; Patient would benefit from skilled rehabilitation services to address the above impairments to restore functional capacity.    Thank you for referring this patient to [place], PT , SPT     Minutes   Time  Based CPT  Physical Performance test, Therapeutic Exercise, Therapeutic Activities, NM Re-Education, Manual Therapy, Gait Training, Massage, Aquatic Therapy, Canalith Repositioning, Iontophoresis, Ultrasound, Orthotic fitting/training, Prosthetic fitting 45 min thr ex   Service Based (untimed) CPT  PT/OT evaluation, PT/OT re-eval, E-stim-unattended, Mechanical traction, Vasopneumatic device    Unbilled time (rest, etc)        Total Treatment Time 45 thr ex

## 2020-10-30 ENCOUNTER — Ambulatory Visit: Payer: BLUE CROSS/BLUE SHIELD | Admitting: Rehabilitative and Restorative Service Providers"

## 2020-10-30 ENCOUNTER — Ambulatory Visit: Payer: BLUE CROSS/BLUE SHIELD | Admitting: Sports Medicine

## 2020-10-30 VITALS — BP 139/75 | HR 66 | Ht 70.0 in | Wt 180.0 lb

## 2020-10-30 DIAGNOSIS — M170 Bilateral primary osteoarthritis of knee: Secondary | ICD-10-CM

## 2020-10-30 NOTE — Progress Notes (Signed)
[facility] Orthopedic Sports/Spine Rehabilitation  PT Treatment Note    Todays Date: 10/30/2020    Name: Jacobs Tise  DOB: Aug 18, 1960  Referring Physician: Lestine Box*  Diagnosis:   1. Bilateral primary osteoarthritis of knee         Visit #: 10    Subjective:  Pain Assessment: 1-2  Pt reports an improvement in pain intensity and pain frequency since previous treatment session.  He is not too sore coming in to PT today as he is coming back from vacation where he was able to relatively rest. He did have some soreness with walking a lot on the beach      Objective:  ROM -  Bilateral, Knee,   Strength - Therapeutic Exercises per flowsheet  Function: - Unchanged- continues to be limited in prolonged activities and has pain spikes throughout the day if he pivots or turns the wrong way.   Education:  Updated HEP, Verbal cues for ther ex, Joint ed concepts    Objective       Bike  5 min    Heel prop    QS 5sx20   SLR x30   Bridge 3sx30    S/L hip abd x30   Clamshell x30   Prone hip ext x30    Standing DL calf raise x30 2-10# DB   Mini squat x30 2-10# DB   Flatback bendover x30 2-10# DB   SLS 10sx3   Anterior clock step  x20 B 2-10# DB   Lateral clock step x20 B 2-10# DB   Slider A, L, P x20 B   Step up 2in x20    Step dn 2in x20        Treatment:  Ther Exercise per flowsheet    Assessment:   Patient was able to complete his entire program today as documented above with no increase in his pain but fatigue present at the conclusion of the session. He has follow up with MD later today.        Plan of Care:  Continue per Plan of care -  As written; Patient would benefit from skilled rehabilitation services to address the above impairments to restore functional capacity.    Thank you for referring this patient to [facility]    Marta Antu, PT , DPT     Minutes   Time Based CPT  Physical Performance test, Therapeutic Exercise, Therapeutic  Activities, NM Re-Education, Manual Therapy, Gait Training, Massage, Aquatic Therapy, Canalith Repositioning, Iontophoresis, Ultrasound, Orthotic fitting/training, Prosthetic fitting 45 min thr ex   Service Based (untimed) CPT  PT/OT evaluation, PT/OT re-eval, E-stim-unattended, Mechanical traction, Vasopneumatic device    Unbilled time (rest, etc)        Total Treatment Time 45 thr ex

## 2020-10-30 NOTE — Progress Notes (Signed)
Chief Complaint   Patient presents with    Right Knee - Follow-up    Left Knee - Follow-up    Appointment Follow Up       History:  Lance Peters is a 60 y.o. male who presents today for bilateral knee pain follow up.  Pain due to bilateral knee osteoarthritis.    He says that overall he feels like his symptoms are gradually improving with physical therapy.  In general he does feel like sometimes symptoms worsen with work.  He says that he was recently off of work for 1 week for a vacation at [employer] is in [place] with his family.  He was suspecting that this might help relieve some symptoms, but says that he was also very active there, playing and running around on the beaches with his family.  There is minimal swelling.  He does report some stiffness if he is seated or still for long periods of time.      He had cortisone injections 3 months ago.  These did provide some pain relief.  Decision was made at that point not to repeat viscosupplementation because it has not been significantly beneficial.  He feels like overall symptoms are fairly well controlled at this time and would like to postpone the next round of cortisone injections until September.          Answers for HPI/ROS submitted by the patient on 07/20/2020  What is your goal for today's visit?: Cortizone injections both knees  Was this the result of an injury?: No  What is your pain level?: 10/10  Please describe the quality of your pain: : aching, discomfort, instability, sharp, shooting, sore, stiffness, tenderness, throbbing  What diagnostic workup have you had for this condition?: MRI scan, X-ray  What treatments have you tried for this condition?: acetaminophen, corticosteroids, heat, ice, NSAIDs, physical therapy, viscosupplementation  Progression since onset: : gradually worsening  Is this a work related condition? : No  Current work status: : usual activities  Fever: No  Chills: No  Tingling: No           Pain    10/30/20 1258   PainSc:   4    PainLoc: Knee       Past medical history, past surgical history, medications, allergies, family history, social history, and review of systems were reviewed today and have been documented separately in this encounter.     Past Medical History:   Diagnosis Date    Arthritis     elbows and shoulders     Chest pain, unspecified     Depression 05/23/2014    Neuromuscular disorder     elbows, shoulders    Unspecified essential hypertension 06/02/2013         Past Surgical History:   Procedure Laterality Date    HAND SURGERY      trigger finger release     PR ARTHRS KNE SURG W/MENISCECTOMY MED/LAT W/SHVG Right 07/16/2016    Procedure: KNEE ARTHROSCOPY WITH PARTIAL MEDIAL MENISCECTOMY;  Surgeon: Jannifer Franklin, MD;  Location: [facility];  Service: Orthopedics    PR COLONOSCOPY FLX DX W/COLLJ SPEC WHEN PFRMD N/A 04/30/2020    Procedure: COLONOSCOPY;  Surgeon: Harlin Rain, MD;  Location: [facility];  Service: GI       Current Outpatient Medications   Medication    benzonatate (TESSALON) 200 mg capsule    DULoxetine (CYMBALTA) 30 mg DR capsule    sertraline (ZOLOFT) 50 mg tablet  atorvastatin (LIPITOR) 20 mg tablet    amLODIPine (NORVASC) 10 mg tablet    benazepril (LOTENSIN) 10 mg tablet    EPINEPHrine 0.3 mg/0.3 mL auto-injector    aspirin 81 MG tablet    acetaminophen (TYLENOL) 325 MG tablet     No current facility-administered medications for this visit.       Objective:  Vitals:    10/30/20 1258   BP: 139/75   Pulse: 66   Weight: 81.6 kg (180 lb)   Height: 1.778 m ('5\' 10"'$ )       Physical Examination:  Constitutional: well appearing in no apparent distress  Knees: No significant swelling.  There is no warmth or redness of either knee.  There is no significant tenderness to palpation.      Imaging:  Imaging studies were personally reviewed by me and demonstrate the following:  X-rays of bilateral knees including 4 views of each from February 03, 2019 show moderate joint space narrowing of the  right medial compartment and mild narrowing of the left medial compartment consistent with degenerative changes of the knees.        Assessment/Plan:    60 year old male with chronic bilateral knee pain consistent with moderate knee osteoarthritis, right worse than left.  Improving with physical therapy.      ICD-10-CM ICD-9-CM   1. Primary osteoarthritis of both knees  M17.0 715.16     We discussed management options today.      Plan:  -He would like to continue with physical therapy as this has been beneficial  -Would like to schedule an appointment 2 months from now to consider repeating corticosteroid injections at that time if he feels that he needs them.  He does not right now.  -We will continue with over-the-counter pain medications as needed including acetaminophen/NSAID.  -We will continue with the use of ice or heat as needed.  -Did not recommend surgical consultation at this time.    Follow-up in 8 weeks for cortisone injections, sooner or later if needed.      Gonzella Lex, D.O.  Assistant Professor of [employer]

## 2020-11-15 ENCOUNTER — Ambulatory Visit
Payer: BLUE CROSS/BLUE SHIELD | Attending: Sports Medicine | Admitting: Rehabilitative and Restorative Service Providers"

## 2020-11-15 DIAGNOSIS — M7541 Impingement syndrome of right shoulder: Secondary | ICD-10-CM | POA: Insufficient documentation

## 2020-11-15 DIAGNOSIS — M170 Bilateral primary osteoarthritis of knee: Secondary | ICD-10-CM | POA: Insufficient documentation

## 2020-11-15 NOTE — Progress Notes (Signed)
[facility] Orthopedic Sports/Spine Rehabilitation  PT Treatment Note    Todays Date: 11/15/2020    Name: Lance Peters  DOB: 12-23-1960  Referring Physician: Lestine Box*  Diagnosis:   1. Bilateral primary osteoarthritis of knee         Visit #: 11    Subjective:  Pain Assessment: 1-2  Pt reports an improvement in pain intensity and pain frequency since previous treatment session.  His knees are not too bad coming in at rest today. He does continue to have a lot of pain however after a long day of work. He had follow up with MD who had the discussion of injections and then surgery and he is going to think about it.      Objective:  ROM -  Bilateral, Knee,   Strength - Therapeutic Exercises per flowsheet  Function: - Unchanged- continues to be limited in prolonged activities and has pain spikes throughout the day if he pivots or turns the wrong way.   Education:  Updated HEP, Verbal cues for ther ex, Joint ed concepts    Objective       Bike  5 min    Heel prop    QS 5sx20   SLR x30   Bridge 3sx30    S/L hip abd x30   Clamshell x30   Prone hip ext x30    Standing DL calf raise x30 2-10# DB   Mini squat x30 2-10# DB   Flatback bendover x30 2-10# DB   SLS 10sx3   Anterior clock step  x20 B 2-10# DB   Lateral clock step x20 B 2-10# DB   Slider A, L, P x20 B   Step up 2in x20    Step dn 2in x20        Treatment:  Ther Exercise per flowsheet    Assessment:   Patient was able to complete his entire program today as documented above with no increase in pain. Answered some questions about surgery recovery and we went over this.        Plan of Care:  Continue per Plan of care -  As written; Patient would benefit from skilled rehabilitation services to address the above impairments to restore functional capacity.    Thank you for referring this patient to [facility]    Marta Antu, PT , DPT     Minutes   Time Based CPT  Physical Performance  test, Therapeutic Exercise, Therapeutic Activities, NM Re-Education, Manual Therapy, Gait Training, Massage, Aquatic Therapy, Canalith Repositioning, Iontophoresis, Ultrasound, Orthotic fitting/training, Prosthetic fitting 45 min thr ex   Service Based (untimed) CPT  PT/OT evaluation, PT/OT re-eval, E-stim-unattended, Mechanical traction, Vasopneumatic device    Unbilled time (rest, etc)        Total Treatment Time 45 thr ex

## 2020-11-21 ENCOUNTER — Other Ambulatory Visit: Payer: Self-pay | Admitting: Primary Care

## 2020-11-26 ENCOUNTER — Ambulatory Visit: Payer: BLUE CROSS/BLUE SHIELD | Admitting: Rehabilitative and Restorative Service Providers"

## 2020-11-26 NOTE — Progress Notes (Deleted)
[facility] Orthopedic Sports/Spine Rehabilitation  PT Treatment Note    Todays Date: 11/26/2020    Name: Lance Peters  DOB: February 28, 1961  Referring Physician: Lestine Box*  Diagnosis:   No diagnosis found.    Visit #: Visit count could not be calculated. Make sure you are using a visit which is associated with an episode.    Subjective:  Pain Assessment: 1-2  Pt reports an improvement in pain intensity and pain frequency since previous treatment session.  His knees are not too bad coming in at rest today. He does continue to have a lot of pain however after a long day of work. He had follow up with MD who had the discussion of injections and then surgery and he is going to think about it.      Objective:  ROM -  Bilateral, Knee,   Strength - Therapeutic Exercises per flowsheet  Function: - Unchanged- continues to be limited in prolonged activities and has pain spikes throughout the day if he pivots or turns the wrong way.   Education:  Updated HEP, Verbal cues for ther ex, Joint ed concepts    Objective       Bike  5 min    Heel prop    QS 5sx20   SLR x30   Bridge 3sx30    S/L hip abd x30   Clamshell x30   Prone hip ext x30    Standing DL calf raise x30 2-10# DB   Mini squat x30 2-10# DB   Flatback bendover x30 2-10# DB   SLS 10sx3   Anterior clock step  x20 B 2-10# DB   Lateral clock step x20 B 2-10# DB   Slider A, L, P x20 B   Step up 2in x20    Step dn 2in x20        Treatment:  Ther Exercise per flowsheet    Assessment:   Patient was able to complete his entire program today as documented above with no increase in pain. Answered some questions about surgery recovery and we went over this.        Plan of Care:  Continue per Plan of care -  As written; Patient would benefit from skilled rehabilitation services to address the above impairments to restore functional capacity.    Thank you for referring this patient to [facility]    Marta Antu, PT , DPT     Minutes   Time Based CPT  Physical Performance test, Therapeutic Exercise, Therapeutic Activities, NM Re-Education, Manual Therapy, Gait Training, Massage, Aquatic Therapy, Canalith Repositioning, Iontophoresis, Ultrasound, Orthotic fitting/training, Prosthetic fitting 45 min thr ex   Service Based (untimed) CPT  PT/OT evaluation, PT/OT re-eval, E-stim-unattended, Mechanical traction, Vasopneumatic device    Unbilled time (rest, etc)        Total Treatment Time 45 thr ex

## 2020-12-13 ENCOUNTER — Ambulatory Visit
Payer: BLUE CROSS/BLUE SHIELD | Attending: Sports Medicine | Admitting: Rehabilitative and Restorative Service Providers"

## 2020-12-13 DIAGNOSIS — M7541 Impingement syndrome of right shoulder: Secondary | ICD-10-CM | POA: Insufficient documentation

## 2020-12-13 DIAGNOSIS — M170 Bilateral primary osteoarthritis of knee: Secondary | ICD-10-CM | POA: Insufficient documentation

## 2020-12-13 NOTE — Progress Notes (Signed)
[facility] Orthopedic Sports/Spine Rehabilitation  PT Treatment Note    Todays Date: 12/13/2020    Name: Lance Peters  DOB: June 20, 1960  Referring Physician: Lestine Box*   Diagnosis:   1. Bilateral primary osteoarthritis of knee         Visit #: 12    Subjective:  Pain Assessment: 1-2  Pt reports an improvement in pain intensity and pain frequency since previous treatment session.  His knees continue to be giving him a hard time. If he has a long day on his feet he can not do any activities after this and has to sit with his feet up. Has been working on [employer] as he can but notes that it only helps him so much.      Objective:  ROM -  Bilateral, Knee,   Strength - Therapeutic Exercises per flowsheet  Function: - Unchanged- continues to be limited in prolonged activities and has pain spikes throughout the day if he pivots or turns the wrong way.   Education:  Updated HEP, Verbal cues for ther ex, Joint ed concepts    Objective       Bike  5 min    Heel prop    QS 5sx20   SLR x30   Bridge 3sx30    S/L hip abd x30   Clamshell x30   Prone hip ext x30    Standing DL calf raise x30 2-10# DB   Mini squat x30 2-10# DB   Flatback bendover x30 2-10# DB   SLS 10sx3   Anterior clock step  x20 B 2-10# DB   Lateral clock step x20 B 2-10# DB   Slider A, L, P x20 B   Step up 2in x20    Step dn 2in x20        Treatment:  Ther Exercise per flowsheet    Assessment:   Patient was able to complete his entire program today as documented above with no increase in pain. He plans to see MD in 2 weeks for his next round of injections and discuss with them the possibility of a knee replacement. He will follow up with PT after this.        Plan of Care:  Continue per Plan of care -  As written; Patient would benefit from skilled rehabilitation services to address the above impairments to restore functional capacity.    Thank you for referring this patient to [facility]    Marta Antu, PT , DPT     Minutes   Time Based CPT  Physical Performance test, Therapeutic Exercise, Therapeutic Activities, NM Re-Education, Manual Therapy, Gait Training, Massage, Aquatic Therapy, Canalith Repositioning, Iontophoresis, Ultrasound, Orthotic fitting/training, Prosthetic fitting 45 min thr ex   Service Based (untimed) CPT  PT/OT evaluation, PT/OT re-eval, E-stim-unattended, Mechanical traction, Vasopneumatic device    Unbilled time (rest, etc)        Total Treatment Time 45 thr ex

## 2020-12-26 ENCOUNTER — Encounter: Payer: Self-pay | Admitting: Sports Medicine

## 2020-12-26 ENCOUNTER — Ambulatory Visit: Payer: BLUE CROSS/BLUE SHIELD | Admitting: Sports Medicine

## 2020-12-26 VITALS — BP 134/82 | HR 70 | Ht 70.0 in | Wt 180.0 lb

## 2020-12-26 DIAGNOSIS — M1712 Unilateral primary osteoarthritis, left knee: Secondary | ICD-10-CM

## 2020-12-26 DIAGNOSIS — M1711 Unilateral primary osteoarthritis, right knee: Secondary | ICD-10-CM

## 2020-12-26 MED ORDER — LIDOCAINE HCL 1 % IJ SOLN *I*
1.5000 mL | Freq: Once | INTRAMUSCULAR | Status: AC | PRN
Start: 2020-12-26 — End: 2020-12-26
  Administered 2020-12-26: 1.5 mL via INTRA_ARTICULAR

## 2020-12-26 MED ORDER — ETHYL CHLORIDE EX AERO (MULTIPLE PATIENTS) *I*
1.0000 | INHALATION_SPRAY | Freq: Once | CUTANEOUS | Status: AC | PRN
Start: 2020-12-26 — End: 2020-12-26
  Administered 2020-12-26: 1 via TOPICAL

## 2020-12-26 MED ORDER — BETAMETHASONE ACET & SOD PHOS 6 (3-3) MG/ML IJ SUSP *I*
12.0000 mg | Freq: Once | INTRAMUSCULAR | Status: AC | PRN
Start: 2020-12-26 — End: 2020-12-26
  Administered 2020-12-26: 12 mg via INTRA_ARTICULAR

## 2020-12-26 MED ORDER — LIDOCAINE HCL 1 % IJ SOLN *I*
0.5000 mL | Freq: Once | INTRAMUSCULAR | Status: AC | PRN
Start: 2020-12-26 — End: 2020-12-26
  Administered 2020-12-26: .5 mL via INTRA_ARTICULAR

## 2020-12-26 MED ORDER — LIDOCAINE HCL 1 % IJ SOLN *I*
1.0000 mL | Freq: Once | INTRAMUSCULAR | Status: AC | PRN
Start: 2020-12-26 — End: 2020-12-26
  Administered 2020-12-26: 1 mL via INTRA_ARTICULAR

## 2020-12-26 NOTE — Procedures (Signed)
Large Joint Aspiration/Injection Procedure: R knee intra - articular    Date/Time: 12/26/2020  3:40 PM EDT  Consent given by: patient  Site marked: site marked  Timeout: Immediately prior to procedure a time out was called to verify the correct patient, procedure, equipment, support staff and site/side marked as required     Procedure Details    Location: knee - R knee intra - articular  Preparation: The site was prepped using the usual aseptic technique.  Anesthetics administered: 1 spray ethyl chloride; 1.5 mL lidocaine HCL 1 %; 0.5 mL lidocaine HCL 1 %; 1 mL lidocaine HCL 1 %  Intra-Articular Steroids administered: 12 mg betamethasone acetate-betamethasone sodium phosphate 6 (3-3) mg/mL  Dressing:  A dry, sterile dressing was applied.  Patient tolerance: patient tolerated the procedure well with no immediate complications

## 2020-12-26 NOTE — Procedures (Signed)
Large Joint Aspiration/Injection Procedure: L knee intra - articular    Date/Time: 12/26/2020  3:40 PM EDT  Consent given by: patient  Site marked: site marked  Timeout: Immediately prior to procedure a time out was called to verify the correct patient, procedure, equipment, support staff and site/side marked as required     Procedure Details    Location: knee - L knee intra - articular  Preparation: The site was prepped using the usual aseptic technique.  Anesthetics administered: 1 spray ethyl chloride; 1.5 mL lidocaine HCL 1 %; 1 mL lidocaine HCL 1 %; 0.5 mL lidocaine HCL 1 %  Intra-Articular Steroids administered: 12 mg betamethasone acetate-betamethasone sodium phosphate 6 (3-3) mg/mL  Dressing:  A dry, sterile dressing was applied.  Patient tolerance: patient tolerated the procedure well with no immediate complications

## 2020-12-31 ENCOUNTER — Ambulatory Visit: Payer: BLUE CROSS/BLUE SHIELD | Admitting: Rehabilitative and Restorative Service Providers"

## 2020-12-31 ENCOUNTER — Other Ambulatory Visit: Payer: Self-pay

## 2020-12-31 DIAGNOSIS — M170 Bilateral primary osteoarthritis of knee: Secondary | ICD-10-CM

## 2020-12-31 NOTE — Progress Notes (Signed)
[facility] Orthopedic Sports/Spine Rehabilitation  PT Treatment Note    Todays Date: 12/31/2020    Name: Lance Peters  DOB: [birth year]  Referring Physician: Lestine Box*   Diagnosis:   1. Bilateral primary osteoarthritis of knee         Visit #: 13    Subjective:  Pain Assessment: 1-2  Pt reports an improvement in pain intensity and pain frequency since previous treatment session.  His knees are feeling great coming in to PT today since having his injections, he has set up an MD appt. With Dr. Manuela Neptune to discuss TKA.      Objective:  ROM -  Bilateral, Knee,   Strength - Therapeutic Exercises per flowsheet  Function: - Unchanged- continues to be limited in prolonged activities and has pain spikes throughout the day if he pivots or turns the wrong way.   Education:  Updated HEP, Verbal cues for ther ex, Joint ed concepts    Objective       Bike  5 min    Heel prop    QS 5sx20   SLR x30   Bridge 3sx30    S/L hip abd x30   Clamshell x30   Prone hip ext x30    Standing DL calf raise x30 2-10# DB   Mini squat x30 2-10# DB   Flatback bendover x30 2-10# DB   SLS 10sx3   Anterior clock step  x20 B 2-10# DB   Lateral clock step x20 B 2-10# DB   Slider A, L, P x20 B   Step up 2in x20    Step dn 2in x20        Treatment:  Ther Exercise per flowsheet    Assessment:   Patient was able to complete his entire program today as documented above with no increase in pain. He was able to complete the program with less pain since having his injections. He will continue to have PT every other week to progress strengthening program until he sees the MD.        Plan of Care:  Continue per Plan of care -  As written; Patient would benefit from skilled rehabilitation services to address the above impairments to restore functional capacity.    Thank you for referring this patient to [place] [facility]    Marta Antu, PT , DPT     Minutes   Time Based CPT  Physical  Performance test, Therapeutic Exercise, Therapeutic Activities, NM Re-Education, Manual Therapy, Gait Training, Massage, Aquatic Therapy, Canalith Repositioning, Iontophoresis, Ultrasound, Orthotic fitting/training, Prosthetic fitting 45 min thr ex   Service Based (untimed) CPT  PT/OT evaluation, PT/OT re-eval, E-stim-unattended, Mechanical traction, Vasopneumatic device    Unbilled time (rest, etc)        Total Treatment Time 45 thr ex

## 2021-01-17 ENCOUNTER — Ambulatory Visit: Payer: BLUE CROSS/BLUE SHIELD | Admitting: Rehabilitative and Restorative Service Providers"

## 2021-01-20 ENCOUNTER — Other Ambulatory Visit: Payer: Self-pay | Admitting: Primary Care

## 2021-01-21 NOTE — Telephone Encounter (Signed)
lov 03/27/20  fuv 03/26/21  Labs 03/23/20

## 2021-01-31 ENCOUNTER — Ambulatory Visit: Payer: BLUE CROSS/BLUE SHIELD | Admitting: Rehabilitative and Restorative Service Providers"

## 2021-02-06 ENCOUNTER — Ambulatory Visit: Payer: BLUE CROSS/BLUE SHIELD | Attending: Adult Health | Admitting: Orthopedic Surgery

## 2021-02-06 ENCOUNTER — Encounter: Payer: Self-pay | Admitting: Orthopedic Surgery

## 2021-02-06 ENCOUNTER — Other Ambulatory Visit: Payer: Self-pay

## 2021-02-06 ENCOUNTER — Ambulatory Visit
Admission: RE | Admit: 2021-02-06 | Discharge: 2021-02-06 | Disposition: A | Discharge status: Home / Self Care | Payer: BLUE CROSS/BLUE SHIELD | Source: Ambulatory Visit

## 2021-02-06 VITALS — BP 120/71 | HR 84 | Ht 70.0 in | Wt 180.0 lb

## 2021-02-06 DIAGNOSIS — M1711 Unilateral primary osteoarthritis, right knee: Secondary | ICD-10-CM

## 2021-02-06 DIAGNOSIS — M76891 Other specified enthesopathies of right lower limb, excluding foot: Secondary | ICD-10-CM

## 2021-02-06 DIAGNOSIS — M25562 Pain in left knee: Secondary | ICD-10-CM

## 2021-02-06 DIAGNOSIS — M11261 Other chondrocalcinosis, right knee: Secondary | ICD-10-CM

## 2021-02-06 NOTE — Progress Notes (Signed)
This is a confidential patient report written  for the express purpose of professional communication.    Subjective: Lance Peters returns with ongoing global right knee pain, right knee arthroscopic surgery ~ 4 years ago ([place]). Pain now limiting ADLs and quality of life. Failed prior conservative care (6 months) including: weight loss (50 lbs), oral medication, activity modification, home exercise, injection (HA, steroid).     ROS: No chest pain, SOB, fever, chills, skin erythema, all remaining systems negative.    Objective:  Constitutional: NAD, comfortable  Psychiatric: Normal affect  Musculoskeletal:  Right knee: Ambulating heel - toe with a non-antalgic gait, soft tissue envelope intact, no appreciable effusion, ROM 0-30, mild crepitus, no coronal or sagittal plane instability, neurocirculatroy exam within normal limits.    Radiographs: 4 views right knee (AP.PA.Lateral.[place]) were reviewed by myself and demonstrate severe medial compartment arthrosis with bone on bone contact    Impression:  1.  Right knee OA - severe    Plan:  Treatment options limited to ongoing conservative care and knee reconstruction. We also discussed knee reconstruction including the rationale for surgery, the technical aspects of the surgery, the anticipated postoperative course, as well as the risks related to total knee arthroplasty. These risks include but are not limited to infection requiring IV antibiotics, further surgery, and in extreme cases loss of limb. We discussed nerve injury resulting in permanent leg weakness/foot drop, and gait impairment, blood vessel injury requiring further surgery for limb salvage, and in extreme cases loss of limb, bleeding requiring transfusion, iatrogenic femur and/or tibia fracture, knee instability, incomplete relief of symptoms, wound drainage, need for further surgery, post operative stiffness requiring manipulation under anesthesia, reflex sympathetic dystrophy, risks of anesthesia, risk  of blood clots in the legs and/or lungs, post-op ileus, risk of exacerbation of underlying medical issues including heart attack, stroke, respiratory failure, kidney failure, liver failure, and even death. The patient understood these risks and may proceed with surgery on an elective basis.  Implant selection discussed, questions entertained and answered.    Right TKA Plan:  Medical Optimization: Y  Clearances: PCP    ICD10: M17.11  CPT: [phone]  Anesthesia: Regional, per protocol  Time : 2 hrs  Abx: Ancef  Equipment:ZB V[employer]ard   DVT: ASA      Answers for HPI/ROS submitted by the patient on 01/31/2021  What is your goal for today's visit?: Discuss knee replacement  Date of onset: : 04/22/2013  Was this the result of an injury?: No  What is your pain level?: 9/10  Please describe the quality of your pain: : aching, catching, clicking, discomfort, instability, popping, sharp, shooting, sore, throbbing  What diagnostic workup have you had for this condition?: MRI scan, X-ray  What treatments have you tried for this condition?: acetaminophen, bracing, corticosteroid injection, glucosamine, heat, ice, physical therapy, viscosupplementation  Progression since onset: : rapidly worsening  Is this a work related condition? : No  Current work status: : usual activities  Fever: No  Chills: No  Weight loss: No  Malaise/fatigue: No  Rash: No  Dry skin: No  Itching: No  Wound : No  Headache: No  Hearing loss: No  Sore throat: No  Dental problem: No  Blindness: No  Visual disturbance: No  Chest pain: No  Palpitations: No  Leg cramps with exercise: No  Leg swelling: No  Fainting: No  Heartburn: No  Nausea: No  Vomiting: No  Diarrhea: No  Constipation: No  Blood in stool: No  Cough :  No  Apnea: No  Shortness of breath: No  Frequency: No  Urgency: No  Blood in urine: No  Incomplete emptying: No  Falls: Yes  Neck pain: No  Back pain: Yes  Joint swelling: Yes  Muscle cramps: Yes  Easy to bruise/bleed: No  Frequent infections:  No  Numbness: No  Tingling: No  Seizures: No  Loss of consciousness: No  Weakness: Yes  Depression: No  Nervous/anxious: No  Memory loss: No

## 2021-02-12 ENCOUNTER — Telehealth: Payer: Self-pay

## 2021-02-12 NOTE — Telephone Encounter (Signed)
Okay to schedule surgery

## 2021-02-14 ENCOUNTER — Encounter: Payer: Self-pay | Admitting: Orthopedic Surgery

## 2021-02-14 ENCOUNTER — Other Ambulatory Visit: Payer: Self-pay | Admitting: Orthopedic Surgery

## 2021-02-14 DIAGNOSIS — Z01818 Encounter for other preprocedural examination: Secondary | ICD-10-CM

## 2021-02-18 ENCOUNTER — Telehealth: Payer: Self-pay | Admitting: Primary Care

## 2021-02-18 NOTE — Telephone Encounter (Signed)
Scheduled patient to be seen with Dr. Kelle Darting for total knee replacement clearance 06/27/21 4pm

## 2021-02-24 ENCOUNTER — Other Ambulatory Visit: Payer: Self-pay

## 2021-02-24 ENCOUNTER — Ambulatory Visit: Payer: BLUE CROSS/BLUE SHIELD | Admitting: Thoracic Diseases

## 2021-02-24 VITALS — BP 129/87 | HR 67 | Temp 97.2°F | Resp 18

## 2021-02-24 DIAGNOSIS — J019 Acute sinusitis, unspecified: Secondary | ICD-10-CM

## 2021-02-24 MED ORDER — DOXYCYCLINE HYCLATE 100 MG PO TABS *I*
100.0000 mg | ORAL_TABLET | Freq: Two times a day (BID) | ORAL | 0 refills | Status: AC
Start: 2021-02-24 — End: 2021-03-03

## 2021-02-24 NOTE — UC Provider Note (Signed)
History     Chief Complaint   Patient presents with    Sinusitis    Otalgia     Pt reports 3.5 weeks of cough, nasal congestion, sinus pressure, with a pressure and discomfort in both ears, along with sinus headache. Symptoms not subsiding with OTC allergy medications.      60 year old male presents urgent care for further evaluation of persistent and progressive sinus symptoms which have been waxing and waning over the past 3-1/2 weeks.  Patient endorses generalized sinus pain and pressure, nasal congestion, purulent rhinorrhea, headache, ear fullness and cough.  Patient denies any history of asthma or smoking.  No known sick contacts or COVID exposure.  Patient has not been vaccinated for COVID or flu.  No associated chest tightness or shortness of breath.  Patient has been using over-the-counter sinus and cold medications without significant relief.       History provided by:  Patient  Language interpreter used: No    Sinusitis  Associated symptoms: congestion, cough, ear pain, fatigue, headaches and rhinorrhea    Otalgia  Associated symptoms: congestion, cough, headaches and rhinorrhea        Medical/Surgical/Family History     Past Medical History:   Diagnosis Date    Arthritis     elbows and shoulders     Chest pain, unspecified     Depression 05/23/2014    Neuromuscular disorder     elbows, shoulders    Unspecified essential hypertension 06/02/2013        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            Past Surgical History:   Procedure Laterality Date    HAND SURGERY      trigger finger release      PR ARTHRS KNE SURG W/MENISCECTOMY MED/LAT W/SHVG Right 07/16/2016    Procedure: KNEE ARTHROSCOPY WITH PARTIAL MEDIAL MENISCECTOMY;  Surgeon: Jannifer Franklin, MD;  Location: [facility];  Service: Orthopedics    PR COLONOSCOPY FLX DX W/COLLJ SPEC WHEN PFRMD N/A 04/30/2020    Procedure: COLONOSCOPY;  Surgeon: Harlin Rain, MD;  Location: [facility];  Service: GI     Family History   Problem Relation Age of Onset    Colon polyps Mother     Cancer Mother     Colon cancer Neg Hx           Social History     Tobacco Use    Smoking status: Never    Smokeless tobacco: Never   Substance Use Topics    Alcohol use: No     Alcohol/week: 1.0 standard drink     Types: 1 Cans of beer per week     Comment: abstinent since april 2018    Drug use: No     Living Situation     Questions Responses    Patient lives with     Homeless     Caregiver for other family member     External Services  Employment     Domestic Violence Risk                 Review of Systems   Review of Systems   Constitutional: Positive for fatigue.   HENT: Positive for congestion, ear pain, postnasal drip, rhinorrhea, sinus pressure and sinus pain.    Respiratory: Positive for cough. Negative for chest tightness.    Musculoskeletal: Positive for myalgias.   Allergic/Immunologic: Negative for immunocompromised state.   Neurological: Positive for headaches. Negative for dizziness and light-headedness.   Psychiatric/Behavioral: Negative for confusion.       Physical Exam   Vitals      First Recorded BP: 129/87, Resp: 18, Temp: 36.2 C (97.2 F) Oxygen Therapy SpO2: 98 %, Heart Rate: 67, (02/24/21 1054)  .      Physical Exam  Vitals and nursing note reviewed.   Constitutional:       General: He is not in acute distress.     Appearance: Normal appearance. He is ill-appearing.   HENT:      Head: Normocephalic.      Nose: Congestion and rhinorrhea present. Rhinorrhea is purulent.      Right Sinus: Maxillary sinus tenderness and frontal sinus  tenderness present.      Left Sinus: Maxillary sinus tenderness and frontal sinus tenderness present.      Mouth/Throat:      Mouth: Mucous membranes are moist.      Pharynx: Oropharynx is clear. Uvula midline. Posterior oropharyngeal erythema and uvula swelling present.      Tonsils: No tonsillar exudate.   Eyes:      Conjunctiva/sclera: Conjunctivae normal.   Cardiovascular:      Rate and Rhythm: Normal rate and regular rhythm.   Pulmonary:      Effort: Pulmonary effort is normal.      Breath sounds: Normal breath sounds.   Musculoskeletal:      Cervical back: Normal range of motion. Tenderness present.   Lymphadenopathy:      Cervical: No cervical adenopathy.   Skin:     General: Skin is warm.   Neurological:      Mental Status: He is alert and oriented to person, place, and time.   Psychiatric:         Mood and Affect: Mood normal.          Medical Decision Making   Medical Decision Making  Assessment:    60 year old male with waxing and waning sinus symptoms over the past 3-1/2 weeks.  Patient has received COVID vaccination.  No influenza vaccine.    Differential diagnosis:    Bacterial sinusitis  Viral Sinusitis  Allergic Rhinitis  Nasopharyngitis  Rhinosinusitis  Acute URI  Bronchitis   Tension HA  Covid/flu/rsv    Plan and Results:    Will prescribe doxycycline for suspected sinusitis.  Reviewed other conservative treatment measures for symptoms as documented in discharge  Follow-up with PCP if symptoms persist or worsen          Final Diagnosis    ICD-10-CM ICD-9-CM   1. Acute non-recurrent sinusitis, unspecified location  J01.90 461.9         Gerrit Heck, NP

## 2021-02-24 NOTE — Patient Instructions (Signed)
You may use warm compresses (i.e. Washcloth warmed up with hot water, 10 minutes at a time) on bilateral sinuses 4-5 times daily for symptom relief.    Utilizing steam/humidified air from hot showers/steam baths may also help with sinus congestion symptoms.    Please utilize OTC nasal saline rinses or a netti pot once in the morning and once at night, to improve nasal congestion symptoms    Blow your nose, then use saline nasal spray to rinse bilaterally, blow your nose again, then use steroidal nasal spray (flonase).    Please continue to hydrate aggressively, and get plenty of rest.    You may use of over-the-counter Mucinex DM 600 mg-30 mg twice daily x 5 days for mucus/congestion/cough relief.    You may also purchase and utilize OTC Vicks vapor rub to apply to your chest as directed for cough relief.

## 2021-02-28 ENCOUNTER — Other Ambulatory Visit: Payer: Self-pay | Admitting: Primary Care

## 2021-03-17 ENCOUNTER — Other Ambulatory Visit: Payer: Self-pay

## 2021-03-17 ENCOUNTER — Ambulatory Visit: Payer: BLUE CROSS/BLUE SHIELD | Attending: Nurse Practitioner | Admitting: Nurse Practitioner

## 2021-03-17 ENCOUNTER — Encounter: Payer: Self-pay | Admitting: Nurse Practitioner

## 2021-03-17 VITALS — BP 130/85 | HR 64 | Temp 97.5°F | Resp 18

## 2021-03-17 DIAGNOSIS — Z20828 Contact with and (suspected) exposure to other viral communicable diseases: Secondary | ICD-10-CM | POA: Insufficient documentation

## 2021-03-17 DIAGNOSIS — J069 Acute upper respiratory infection, unspecified: Secondary | ICD-10-CM | POA: Insufficient documentation

## 2021-03-17 DIAGNOSIS — Z20822 Contact with and (suspected) exposure to covid-19: Secondary | ICD-10-CM | POA: Insufficient documentation

## 2021-03-17 NOTE — Patient Instructions (Signed)
Lance Peters you were seen at urgent care for cough and congestion. You were treated for a viral upper respiratory illness, that does not require an antibiotic at this time    Please continue to hydrate aggressively by drinking plenty of fluids, and get plenty of rest.    Please use of home humidifier overnight in your room. Inhaling steam from a hot shower with vicks vapor rub to the chest is also helpful.     Please use over-the-counter Mucinex DM 600 mg-30 mg twice daily for congestion/cough/mucus relief.     Please purchase and continue use of over-the-counter Flonase nasal spray 2 sprays in each nostril once daily in the morning.    Ibuprofen 400-600 mg every 6 hours for the next 3-5 days as needed for fever, bodyaches, and/or pain. Can alternate with Tylenol 650 mg every 4-6 hours as needed for pain. Maximum daily dose = 3000 mg.     Steam showers can be helpful right before bed and first thing in the morning to loosen any mucous      At time of visit it was deemed your symptoms are more consistent with a viral infection. With this said there are certain situations in which a bacteria can take advantage of your weakened immune system and your illness can evolve into a bacterial infection. If you notice no improvement in your symptoms, or if your symptoms worsen over the next 7-10 days rather than improve, please follow up with your PCP for re-evaluation.      You were also tested for COVID, flu and RSV at the time of your visit . Your results will be available in MY CHART in the next 1-2 days. You must quarantine until your results return. If your COVID is positive you will receive further instruction from the Department of Health. Please continue to wear your mask.     Thank you Lance Peters for choosing [facility] for your health concerns.    If your condition changes and/or worsens, please follow up with your primary care provider or return to [facility] urgent care for further evaluation.    If short of breath,  chest pains or any other concerns please report to the emergency room.    In the event of an Emergency dial 911.

## 2021-03-17 NOTE — UC Provider Note (Signed)
Chief Complaint:   Chief Complaint   Patient presents with    Sinusitis     Was treated with abx in late November for sinus infection - pt reports he felt better but starting a week ago his symptoms started getting worse again. Reports productive cough with green mucous and sinus headache, unable to sleep. Has been taking Dayquil and Nyquil, and Zyrtec without relief. Has not taken any medications this AM.         HPI:  60 y.o. male w hx as noted in chart presents to Urgent Care for URI symptoms x7 days.  Patient was treated 14 days ago for a sinus infection and felt better after taking the antibiotic.  He reports that his symptoms are now back and getting much worse.  He reports he has been taking DayQuil, NyQuil and Zyrtec without much relief.  He has not taken any medications this morning.  Upon chart review he was seen at urgent care on 02/24/2021 and given a course of doxycycline for acute bacterial sinusitis.    Symptoms are progressively worsened    Symptoms include: Pain productive cough with green mucus, sinus headache and insomnia    Denies fever, chills, chest pain or sob    COVID exposure / other sick contacts: no  COVID 19 positive in the last 3 months: no  Fully vaccinated for COVID 19: yes    VITALS:  BP 130/85    Pulse 64    Temp 36.4 C (97.5 F)    Resp 18    SpO2 97%     ROS: see HPI above      PHYSICAL EXAM:  Appearance: Well appearing, no acute distress   Eyes: no conjunctival injection or drainage   Ears: Normal TMs and canals bilaterally   Nose: congestion, frontal and maxillary sinuses non tender to palpation   Mouth/Throat: mild erythema of oropharynx, no petechiae or exudate, no PTA   Neck: No cervical LAD, no tenderness, full AROM   CV: RRR, no murmur   Pulm: no acute respiratory distress, Lungs clear to auscultation bilaterally   Skin: no pallor or rash     MEDICAL DECISION MAKING:  Assessment: 60 y.o. male w hx as noted in chart presents to UC with cough and congestion.    Differential  Diagnosis: Viral URI w cough, Bronchitis, Acute nasopharyngitis, Sinusitis, COVID 19    Plan and Results:     Orders Placed This Encounter    Influenza A PCR    Influenza B PCR    RSV PCR    COVID-19 PCR       No results found for this visit on 03/17/21.    Diagnosis and Disposition:  1. Viral URI with cough        Patient Instructions   Malachy Coleman you were seen at urgent care for cough and congestion. You were treated for a viral upper respiratory illness, that does not require an antibiotic at this time    Please continue to hydrate aggressively by drinking plenty of fluids, and get plenty of rest.    Please use of home humidifier overnight in your room. Inhaling steam from a hot shower with vicks vapor rub to the chest is also helpful.     Please use over-the-counter Mucinex DM 600 mg-30 mg twice daily for congestion/cough/mucus relief.     Please purchase and continue use of over-the-counter Flonase nasal spray 2 sprays in each nostril once daily in the morning.  Ibuprofen 400-600 mg every 6 hours for the next 3-5 days as needed for fever, bodyaches, and/or pain. Can alternate with Tylenol 650 mg every 4-6 hours as needed for pain. Maximum daily dose = 3000 mg.     Steam showers can be helpful right before bed and first thing in the morning to loosen any mucous      At time of visit it was deemed your symptoms are more consistent with a viral infection. With this said there are certain situations in which a bacteria can take advantage of your weakened immune system and your illness can evolve into a bacterial infection. If you notice no improvement in your symptoms, or if your symptoms worsen over the next 7-10 days rather than improve, please follow up with your PCP for re-evaluation.      You were also tested for COVID, flu and RSV at the time of your visit . Your results will be available in MY CHART in the next 1-2 days. You must quarantine until your results return. If your COVID is positive you will  receive further instruction from the Department of Health. Please continue to wear your mask.     Thank you [name] for choosing [facility] for your health concerns.    If your condition changes and/or worsens, please follow up with your primary care provider or return to [facility] care for further evaluation.    If short of breath, chest pains or any other concerns please report to the emergency room.    In the event of an Emergency dial 911.          Encourage fluids, rest, & good hand hygiene.    Use over the counter medications as discussed.    Please start the new medications as below:        Please follow up with your physician as below:    Follow up in about 3 days (around 03/20/2021).    Given the patient's reassuring vital signs and overall well appearance, the patient can be managed safely at home. Patient advised to call PCP or return to urgent care for any worsening or concerning symptoms as reviewed and provided on AVS.

## 2021-03-18 LAB — COVID-19 NAAT (PCR): COVID-19 NAAT (PCR): NEGATIVE

## 2021-03-18 LAB — INFLUENZA A: Influenza A PCR: 0

## 2021-03-18 LAB — INFLUENZA B PCR: Influenza B PCR: 0

## 2021-03-18 LAB — RSV PCR: RSV PCR: 0

## 2021-03-21 ENCOUNTER — Other Ambulatory Visit
Admission: RE | Admit: 2021-03-21 | Discharge: 2021-03-21 | Disposition: A | Discharge status: Home / Self Care | Payer: BLUE CROSS/BLUE SHIELD | Source: Ambulatory Visit | Attending: Primary Care | Admitting: Primary Care

## 2021-03-21 DIAGNOSIS — I10 Essential (primary) hypertension: Secondary | ICD-10-CM | POA: Insufficient documentation

## 2021-03-21 DIAGNOSIS — Z125 Encounter for screening for malignant neoplasm of prostate: Secondary | ICD-10-CM | POA: Insufficient documentation

## 2021-03-21 DIAGNOSIS — R7301 Impaired fasting glucose: Secondary | ICD-10-CM | POA: Insufficient documentation

## 2021-03-21 LAB — COMPREHENSIVE METABOLIC PANEL
ALT: 27 U/L (ref 0–50)
AST: 22 U/L (ref 0–50)
Albumin: 4.4 g/dL (ref 3.5–5.2)
Alk Phos: 69 U/L (ref 40–130)
Anion Gap: 10 (ref 7–16)
Bilirubin,Total: 0.7 mg/dL (ref 0.0–1.2)
CO2: 27 mmol/L (ref 20–28)
Calcium: 9.3 mg/dL (ref 8.6–10.2)
Chloride: 104 mmol/L (ref 96–108)
Creatinine: 0.93 mg/dL (ref 0.67–1.17)
Glucose: 102 mg/dL — ABNORMAL HIGH (ref 60–99)
Lab: 17 mg/dL (ref 6–20)
Potassium: 4.8 mmol/L (ref 3.3–5.1)
Sodium: 141 mmol/L (ref 133–145)
Total Protein: 6.6 g/dL (ref 6.3–7.7)
eGFR BY CREAT: 93 *

## 2021-03-21 LAB — CBC
Hematocrit: 46 % (ref 40–51)
Hemoglobin: 14.5 g/dL (ref 13.7–17.5)
MCH: 26 pg (ref 26–32)
MCHC: 32 g/dL (ref 32–37)
MCV: 83 fL (ref 79–92)
Platelets: 210 10*3/uL (ref 150–330)
RBC: 5.5 MIL/uL (ref 4.6–6.1)
RDW: 13.1 % (ref 11.6–14.4)
WBC: 8.1 10*3/uL (ref 4.2–9.1)

## 2021-03-21 LAB — PSA (EFF.4-2010): PSA (eff. 4-2010): 2.15 ng/mL (ref 0.00–4.00)

## 2021-03-21 LAB — LIPID PANEL
Chol/HDL Ratio: 4.1
Cholesterol: 146 mg/dL
HDL: 36 mg/dL — ABNORMAL LOW (ref 40–60)
LDL Calculated: 93 mg/dL
Non HDL Cholesterol: 110 mg/dL
Triglycerides: 85 mg/dL

## 2021-03-26 ENCOUNTER — Ambulatory Visit: Payer: BLUE CROSS/BLUE SHIELD | Admitting: Primary Care

## 2021-03-26 ENCOUNTER — Other Ambulatory Visit: Payer: Self-pay

## 2021-03-26 VITALS — BP 130/80 | HR 70 | Temp 97.5°F | Ht 70.0 in | Wt 183.0 lb

## 2021-03-26 DIAGNOSIS — I10 Essential (primary) hypertension: Secondary | ICD-10-CM

## 2021-03-26 DIAGNOSIS — R7301 Impaired fasting glucose: Secondary | ICD-10-CM

## 2021-03-26 MED ORDER — CEFPODOXIME PROXETIL 200 MG PO TABS *I*
200.0000 mg | ORAL_TABLET | Freq: Two times a day (BID) | ORAL | 0 refills | Status: AC
Start: 2021-03-26 — End: 2021-04-05

## 2021-03-26 NOTE — Progress Notes (Signed)
Lance Peters is a 60 y.o. male   03/26/2021    No chief complaint on file.      HPI:  Lance Peters arrived for a scheduled follow up appointment.    Here for routine follow-up.  He is treated for hypertension, impaired fasting glucose.  Labs as noted.  Everything is on target.    He says that he gets dizzy/lightheaded pretty much every time he stands up from a seated or laying position.  Each episode lasts for couple seconds, and then passes.  Feels normal afterwards.  He is very physically active with work, hunting, other physical activities.  He does not have dizziness or lightheadedness when physically active.  No exertional chest pain etc.  Blood pressure medications as noted.  No spinning symptoms when rolling over in bed etc.    Additionally, he notes that he has been sick with respiratory symptoms for quite a while.  He has been to urgent care couple of times, the first time treated with doxycycline, the second time diagnosed with viral URI.  In the documentation for his first urgent care appointment, they note that he had been sick, at that time, for about 3-1/2 weeks, so overall illness has been present since mid to late October.  Current symptoms include postnasal drip, cough, sneezing, chest congestion, thick mucus etc.  He felt better for short time after completing the doxycycline, but symptoms have recurred.  Has a history of hives reported with penicillin use, in childhood.    He is planning total knee arthroplasty in March.  Planning to see me again before that appointment, for his preop clearance.    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       Allergies: Wasp venom, Penicillins, and Synvisc [hylan g-f 20]    Medication list reviewed, changes made  Current Outpatient Medications   Medication Sig    DULoxetine (CYMBALTA) 30 mg DR capsule TAKE 1 CAPSULE BY MOUTH EVERY DAY    sertraline (ZOLOFT) 50 mg tablet TAKE 1 TABLET BY MOUTH EVERY DAY    amLODIPine (NORVASC) 10 mg tablet TAKE 1 TABLET BY MOUTH EVERY DAY    atorvastatin (LIPITOR) 20 mg tablet TAKE 1 TABLET BY MOUTH EVERY DAY WITH DINNER    benazepril (LOTENSIN) 10 mg tablet Take 1 tablet (10 mg total) by mouth daily    EPINEPHrine 0.3 mg/0.3 mL auto-injector Inject 0.3 mLs (0.3 mg total) into the muscle as needed for Anaphylaxis    aspirin 81 MG tablet Take 81 mg by mouth daily     No current facility-administered medications for this visit.        Allergy list reviewed  Problem list reviewed    OBJECTIVE:  Vitals:    03/26/21 0929   BP: 130/80   Pulse: 70   Temp: 36.4 C (97.5 F)   Weight: 83 kg (183 lb)   Height: 1.778 m ('5\' 10"' )       GENERAL:  Alert, no distress  HEENT: No ocular discharge.  LUNGS:  Normal respiratory effort, Clear to auscultation bilaterally, without wheezes or crackles  CV: Regular rate and rhythm, S1 and S2 are normal, without murmurs rubs or gallops.      Recent Results (from the past 336 hour(s))   COVID-19 PCR    Collection Time: 03/17/21 12:06 PM   Result Value Ref Range    COVID-19 Source Nasopharyngeal     COVID-19 PCR NEGATIVE NEG   Influenza A PCR    Collection Time: 03/17/21 12:06 PM    Specimen: Nasopharyngeal   Result Value Ref Range    Influenza A PCR .    Influenza B PCR    Collection Time: 03/17/21 12:06 PM    Specimen: Nasopharyngeal   Result Value Ref Range    Influenza B PCR .    RSV PCR    Collection Time: 03/17/21 12:06 PM    Specimen: Nasopharyngeal   Result Value Ref Range    RSV PCR .    CBC    Collection Time: 03/21/21  1:20 PM   Result Value Ref Range    WBC 8.1 4.2 - 9.1 THOU/uL    RBC 5.5 4.6 -  6.1 MIL/uL    Hemoglobin 14.5 13.7 - 17.5 g/dL    Hematocrit 46 40 - 51 %    MCV 83 79 - 92 fL    MCH 26 26 - 32 pg    MCHC 32 32 - 37 g/dL    RDW 13.1 11.6 - 14.4 %    Platelets 210 150 - 330 THOU/uL   Comprehensive metabolic panel    Collection Time: 03/21/21  1:20 PM   Result Value Ref Range    Sodium 141 133 - 145 mmol/L    Potassium 4.8 3.3 - 5.1 mmol/L    Chloride 104 96 - 108 mmol/L    CO2 27 20 - 28 mmol/L    Anion Gap 10 7 - 16    UN 17 6 - 20 mg/dL    Creatinine 0.93 0.67 - 1.17 mg/dL    eGFR BY CREAT 93 *    Glucose 102 (H) 60 - 99 mg/dL    Calcium 9.3 8.6 - 10.2 mg/dL    Total Protein 6.6 6.3 - 7.7 g/dL    Albumin 4.4 3.5 - 5.2 g/dL    Bilirubin,Total 0.7 0.0 - 1.2 mg/dL    AST 22 0 - 50 U/L    ALT 27 0 - 50 U/L    Alk Phos 69 40 - 130 U/L   Lipid Panel (Reflex to Direct  LDL if Triglycerides more than 400)    Collection Time: 03/21/21  1:20 PM   Result Value Ref Range    Cholesterol 146 mg/dL    Triglycerides 85 mg/dL    HDL 36 (L) 40 - 60 mg/dL    LDL Calculated 93 mg/dL    Non HDL Cholesterol 110 mg/dL    Chol/HDL Ratio 4.1    PSA (eff.07-2008)    Collection Time: 03/21/21  1:20 PM   Result Value Ref Range    PSA (eff. 07-2008) 2.15 0.00 - 4.00 ng/mL          ASSESSMENT/PLAN:  Upper respiratory infection-symptoms present for approximately 2 months, I think another trial of antibiotics is warranted.  Allergic to penicillin, will try Vantin.  Covering sinusitis and lower respiratory organisms.    Impaired fasting glucose-fasting glucose is essentially normal.  Apparently did not order hemoglobin A1c for this round of labs, but  I do not think it is necessary to send him back to the lab just for an A1c now.  He will continue his current diet, physical activity, etc.  Continuing efforts towards weight loss, at the recommendation of his orthopedic surgeon.    Hypertension-blood pressure is on target.  He is reporting some orthostatic symptoms, I have asked him to reduce the amlodipine from 10 to 5 mg daily,  follow BP, follow symptoms.  Hydrate.  If symptoms improve and blood pressure increases significantly we may need to consider an alternative antihypertensive medication.

## 2021-05-20 ENCOUNTER — Other Ambulatory Visit: Payer: Self-pay | Admitting: Primary Care

## 2021-05-20 NOTE — Telephone Encounter (Signed)
LOV  12.20.22  NOV  3.23.23  LABS  12.15.22

## 2021-06-04 ENCOUNTER — Other Ambulatory Visit: Payer: Self-pay | Admitting: Primary Care

## 2021-06-04 NOTE — Telephone Encounter (Signed)
LOV - 03-26-21  NOV - 06-27-21  LABS - 03-21-21

## 2021-06-16 ENCOUNTER — Encounter: Payer: Self-pay | Admitting: Rehabilitative and Restorative Service Providers"

## 2021-06-27 ENCOUNTER — Other Ambulatory Visit: Payer: Self-pay

## 2021-06-27 ENCOUNTER — Encounter: Payer: Self-pay | Admitting: Internal Medicine

## 2021-06-27 ENCOUNTER — Ambulatory Visit: Payer: BLUE CROSS/BLUE SHIELD | Admitting: Internal Medicine

## 2021-06-27 ENCOUNTER — Ambulatory Visit: Payer: BLUE CROSS/BLUE SHIELD | Admitting: Primary Care

## 2021-06-27 VITALS — BP 130/70 | HR 68 | Temp 98.2°F | Ht 70.0 in | Wt 190.0 lb

## 2021-06-27 DIAGNOSIS — I10 Essential (primary) hypertension: Secondary | ICD-10-CM

## 2021-06-27 DIAGNOSIS — R7301 Impaired fasting glucose: Secondary | ICD-10-CM

## 2021-06-27 DIAGNOSIS — E785 Hyperlipidemia, unspecified: Secondary | ICD-10-CM

## 2021-06-27 DIAGNOSIS — Z01818 Encounter for other preprocedural examination: Secondary | ICD-10-CM

## 2021-06-27 LAB — PCMH DEPRESSION ASSESSMENT

## 2021-06-27 NOTE — Progress Notes (Signed)
[facility]  Preoperative Note    No chief complaint on file.      HPI:  Lance Peters is a 61 y.o. male presents today for preop evaluation. Patient is scheduled for a right total knee arthroplasty     Surgery on it 2018, injections, gel injections and had a reaction to that, then tired a different gel but not much relief. Doing PT.     Chronic medical problems include:   1. Hypertension on benazepril and amlodipine.  Tolerates these well.  2. Dyslipidemia and on atorvastatin   3. Impaired fasting glucose and most recent A1c was 6.0    Patient denies history of COPD, Asthma, PE, sleep apnea or heart disease. Patient has never been a smoker.   Patient denies alcohol use  There is denies family history of CAD.  Patient does have a physical job, walks a lot regularly. Activities include walking, playing with grandkids, hiking in the woods. Patient is able to complete these activities without any chest pain, shortness of breath, palpitations or lightheadedness.     Patient have had anesthesia before, has never had a problem. There is no family history of problems with anesthesia.   There is no personal/family history of bleeding disorders.   Patient has been intubated before, has never had a problem.     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        CURRENT MEDICATIONS:    Current Outpatient Medications:     benazepril (LOTENSIN) 10 mg tablet, TAKE 1 TABLET BY MOUTH EVERY DAY, Disp: 90 tablet, Rfl: 5    amLODIPine (NORVASC) 10 mg tablet, TAKE 1 TABLET BY MOUTH EVERY DAY, Disp: 30  tablet, Rfl: 5    DULoxetine (CYMBALTA) 30 mg DR capsule, TAKE 1 CAPSULE BY MOUTH EVERY DAY, Disp: 90 capsule, Rfl: 1    sertraline (ZOLOFT) 50 mg tablet, TAKE 1 TABLET BY MOUTH EVERY DAY, Disp: 90 tablet, Rfl: 1    atorvastatin (LIPITOR) 20 mg tablet, TAKE 1 TABLET BY MOUTH EVERY DAY WITH DINNER, Disp: 30 tablet, Rfl: 11    aspirin 81 MG tablet, Take 1 tablet (81 mg total) by mouth daily, Disp: , Rfl:     EPINEPHrine 0.3 mg/0.3 mL auto-injector, Inject 0.3 mLs (0.3 mg total) into the muscle as needed for Anaphylaxis, Disp: 2 Device, Rfl: 2  Medications reviewed with patient, reconciled and no changes made today.    ALLERGIES:  Wasp venom, Penicillins, and Synvisc [hylan g-f 20]   Allergies reviewed with patient and confirmed today.    EXAMINATION:  Vitals:    06/27/21 0817   BP: 130/70   BP Location: Left arm   Patient Position: Sitting   Cuff Size: adult   Pulse: 68   Temp: 36.8 C (98.2 F)   TempSrc: Temporal   SpO2: 97%   Weight: 86.2 kg (190 lb)   Height: 1.778 m ('5\' 10"'$ )  Body mass index is 27.26 kg/m.  BP Readings from Last 4 Encounters:   06/27/21 130/70   03/26/21 130/80   03/17/21 130/85   02/24/21 129/87      Wt Readings from Last 4 Encounters:   06/27/21 86.2 kg (190 lb)   03/26/21 83 kg (183 lb)   02/06/21 81.6 kg (180 lb)   12/26/20 81.6 kg (180 lb)         Physical examination:  General: Well-appearing and well-nourished, nontoxic, no acute distress.  HEENT: Normocephalic, Atraumatic. Normal Conjunctiva.   Neck: Supple, trachea midline.  No JVD.  No carotid bruits  Heart: Regular rate and rhythm no murmurs, rubs or gallops.  Lungs: Breathing is unlabored.  No respiratory distress, respiratory rate 16.  Lungs are clear to auscultation, no wheezes rales or rhonchi.  Extremities: Without swelling.  Neurological: Alert and oriented x3 no focal neurological deficits noted.      ASSESSMENT/PLAN:  1. Preop-examination  Patient presents today for a pre-op examination. EKG  performed today showed normal sinus rhythm at 60 bpm without ST/T changes or arrhythmias..  This is unchanged from previous EKG.  Patient's Revised Cardiac Risk Index score is 0 equating to a 0.4% risk.  Patient's Respiratory Failure Risk Index score is 63 (age) equating to a 0.5% risk.  He has preop labs including recheck of A1c next week. Recent labs were normal and fasting BG 101. He has always been around 6, assuming this isn't significantly elevated >7.5, I see no contraindications to moving forward with this surgery a scheduled.     REVISED CARDIAC RISK INDEX (RCRI) (one point for each):  High Risk Surgical Procedure  H/O Ischemic Heart Dz  H/O CHF  H/O TIA or CVA  Preoperative Insulin Therapy  Preoperative Cr >1.9 mg/dl.    RCRI predicts risk of major cardiac event  Truman Hayward et al.  Circulation 1999; 100:1043)  Score of 0 = 0.4%                 1 = 0.9%                 2 = 6.8%                 3 = 11%    RESPIRATORY FAILURE RISK FACTORS (point system):  Type of surgery:    AAA 27, Thoracic 21, Neurosurg 14, upper abd 14, Periph vascular 14, Neck 11, Emergent 11  Age:  >70 = 6, and 60-69 = 4  Other:  Alb <3 = 9, BUN >30 = 8, COPD = 6, Wheelchair/Walker = 6    RESPIRATORY FAILURE RISK INDEX:  Mal Misty, Ann Surg 2000; 232:  242-253)  POINTS translates to risk of Respiratory Failure  <11 = 0.5%  <20 = 1.8%  <28 = 4.2%  <41 = 10.1%  41+ = 26.6%    2. Essential hypertension  Blood pressure on target today, continue benazepril and amlodipine.    3. Hyperlipidemia  Lipids on target, continue atorvastatin.    4. Impaired fasting glucose  Stable over several years with most recent fasting BG 101.  His A1c was not rechecked this December due to a lab error however he will have this done as part of his preop eval next week and anticipated will be stable.    Follow-up in December as previously planned with Dr. Sudie Bailey, PA on 06/27/2021 8:30 AM

## 2021-07-04 NOTE — Discharge Instructions (Signed)
Pre-Operative Instructions - Total Joint Replacement                FOLLOW YOUR SURGEON'S INSTRUCTIONS IF DIFFERENT THAN BELOW.                   PRIOR TO SURGERY  Five days before surgery, please STOP taking if surgeon does not specify:  Anti-inflammatory medications (Ibuprofen, Motrin, Advil, Mobic, Meloxicam, Aleve, Naproxen, Voltaren, etc.)  Vitamins and herbal supplements, including herbal teas   YOU MAY TAKE ACETAMINOPHEN (TYLENOL) as needed      THREE NIGHTS BEFORE SURGERY FOR INFECTION CONTROL  We ask you to shower for 3 evenings before your surgery using the 4% Chlorhexidine scrubs the nurse has given you.   Each night take a shower using your normal soap and shampoo.  Afterward, while still in the shower, use 4% Chlorhexidine scrub and wash your body from the neck down. DO NOT wash your head, face, eyes and ears with the Chlorhexidine soap. This soap does not lather. Let the soap sit on your skin for 2 minutes. Rinse thoroughly.   Dry off with a clean, fresh towel each evening.  Do not apply lotion after your showers. Do not shave below your waist for one week before your surgery.  If you experience itching or redness on application, wash it off and do not use it again. Continue with an over-the-counter antibacterial soap (such as Dial).  After showering, dress in freshly laundered clothes each evening and sleep on clean bedsheets.      ARRIVAL/SURGICAL TIME  Staff from the [facility] will call you between 1:30 PM and 4 PM on the day before surgery to inform you of your arrival and surgery times. The call will be on Friday if your surgery is on Monday.      DAY BEFORE SURGERY  Keep yourself well hydrated to aid in the placement of your IV and for your general well-being.  Do not eat anything after midnight the night before your surgery (including candy or gum).                                                                                           DAY OF SURGERY  DO NOT CONSUME FOOD OF ANY  KIND. Only Gatorade, clear apple juice or water from midnight until 2 hours before your scheduled surgery.    Valuables such as jewelry, cash and credit cards are best left at home during your stay.  Please send them home with family members.  If you are alone a staff member will assist you in storing your belongings.    Before coming to the hospital, remove all makeup (including mascara), jewelry (including wedding band and watch), hair accessories and nail polish from toes and fingers.  Do not bring any valuables (money, wallet, purse, jewelry, or contact lenses.)    We ask that your cell phone be with you and turned on, so that the Preop and Operating Room nurses may phone you directly with instructions on the morning of surgery if necessary.    If wearing eyeglasses, please bring a case.    DO NOT WEAR CONTACT LENSES.    You may brush your teeth, shower (with Chlorhexidine soap) and use deodorant.    PLEASE BRING YOUR CPAP MASK and TUBING INTO THE PREOPERATIVE AREA.    Patient visitation is restricted at this time due to COVID-19 concerns.       MEDICATIONS: DAY OF SURGERY  Take your medications as directed according to your printed, verbal or MyChart instructions.    Please leave your prescriptions at home with the exception of your inhalers.    AT THE HOSPITAL ON THE DAY OF SURGERY  Park in the Main Ramp garage.  Enter the building through the Main Lobby.     Please stop at the Information Desk for directions to the Surgery Center on [address].    Leave your belongings in the car (except for your CPAP MASK and TUBING.) Your visitors may bring them to your room after surgery.    [facility]  As a convenience, prior to your discharge we will fill any discharge medications you require.      The pharmacy is open from 9am to 5:30pm on weekdays and 10am-2pm on Saturdays.     If you will be alone on the day of surgery and want to leave with your prescribed medication, you may:  Bring a check  made out to [facility]  Use a credit card to pay for your prescription  Have your family call the pharmacy with a credit card number  Only bring cash to the hospital as a last resort. Prescriptions cannot be filled without payment. Thank you for your consideration.    QUESTIONS?  Question about these instructions? Call [phone], between the hours of 8 AM- 4 PM Monday through Friday.   Any questions regarding specifics about your surgery or recovery? Please call your surgeon’s office.

## 2021-07-08 ENCOUNTER — Other Ambulatory Visit
Admission: RE | Admit: 2021-07-08 | Discharge: 2021-07-08 | Disposition: A | Discharge status: Home / Self Care | Payer: BLUE CROSS/BLUE SHIELD | Source: Ambulatory Visit | Attending: Orthopedic Surgery | Admitting: Orthopedic Surgery

## 2021-07-08 ENCOUNTER — Ambulatory Visit
Admission: RE | Admit: 2021-07-08 | Discharge: 2021-07-08 | Disposition: A | Discharge status: Home / Self Care | Payer: BLUE CROSS/BLUE SHIELD | Source: Ambulatory Visit | Attending: Orthopedic Surgery | Admitting: Orthopedic Surgery

## 2021-07-08 ENCOUNTER — Other Ambulatory Visit: Payer: Self-pay

## 2021-07-08 ENCOUNTER — Ambulatory Visit
Admission: RE | Admit: 2021-07-08 | Discharge: 2021-07-08 | Disposition: A | Discharge status: Home / Self Care | Payer: BLUE CROSS/BLUE SHIELD | Source: Ambulatory Visit

## 2021-07-08 ENCOUNTER — Ambulatory Visit
Admit: 2021-07-08 | Discharge: 2021-07-08 | Disposition: A | Discharge status: Home / Self Care | Payer: BLUE CROSS/BLUE SHIELD

## 2021-07-08 VITALS — BP 124/82 | HR 55 | Temp 97.2°F | Ht 70.0 in | Wt 187.8 lb

## 2021-07-08 DIAGNOSIS — Z01818 Encounter for other preprocedural examination: Secondary | ICD-10-CM

## 2021-07-08 DIAGNOSIS — M25461 Effusion, right knee: Secondary | ICD-10-CM

## 2021-07-08 DIAGNOSIS — M1711 Unilateral primary osteoarthritis, right knee: Secondary | ICD-10-CM

## 2021-07-08 DIAGNOSIS — Z01812 Encounter for preprocedural laboratory examination: Secondary | ICD-10-CM | POA: Insufficient documentation

## 2021-07-08 DIAGNOSIS — M170 Bilateral primary osteoarthritis of knee: Secondary | ICD-10-CM

## 2021-07-08 HISTORY — DX: Bilateral primary osteoarthritis of knee: M17.0

## 2021-07-08 LAB — MSSA NASAL NAAT (PCR): MSSA Nasal NAAT (PCR): POSITIVE — AB

## 2021-07-08 LAB — POCT HGB: Hgb, POC: 14.8 g/dL (ref 13.7–17.5)

## 2021-07-08 LAB — COMPREHENSIVE METABOLIC PANEL
ALT: 37 U/L (ref 0–50)
AST: 26 U/L (ref 0–50)
Albumin: 4.8 g/dL (ref 3.5–5.2)
Alk Phos: 56 U/L (ref 40–130)
Anion Gap: 8 (ref 7–16)
Bilirubin,Total: 1.1 mg/dL (ref 0.0–1.2)
CO2: 29 mmol/L — ABNORMAL HIGH (ref 20–28)
Calcium: 10.1 mg/dL (ref 8.6–10.2)
Chloride: 104 mmol/L (ref 96–108)
Creatinine: 0.96 mg/dL (ref 0.67–1.17)
Glucose: 100 mg/dL — ABNORMAL HIGH (ref 60–99)
Lab: 15 mg/dL (ref 6–20)
Potassium: 5.2 mmol/L — ABNORMAL HIGH (ref 3.3–5.1)
Sodium: 141 mmol/L (ref 133–145)
Total Protein: 7 g/dL (ref 6.3–7.7)
eGFR BY CREAT: 89 *

## 2021-07-08 LAB — CBC
Hematocrit: 47 % (ref 40–51)
Hemoglobin: 15.4 g/dL (ref 13.7–17.5)
MCH: 27 pg (ref 26–32)
MCHC: 33 g/dL (ref 32–37)
MCV: 82 fL (ref 79–92)
Platelets: 217 10*3/uL (ref 150–330)
RBC: 5.7 MIL/uL (ref 4.6–6.1)
RDW: 13.2 % (ref 11.6–14.4)
WBC: 6.3 10*3/uL (ref 4.2–9.1)

## 2021-07-08 LAB — MRSA NASAL NAAT (PCR): MRSA nasal NAAT (PCR): NEGATIVE

## 2021-07-08 LAB — PROTIME-INR
INR: 1.1 (ref 0.9–1.1)
Protime: 12.5 s (ref 10.0–12.9)

## 2021-07-08 LAB — TYPE AND SCREEN
ABO RH Blood Type: O NEG
Antibody Screen: NEGATIVE

## 2021-07-08 LAB — APTT: aPTT: 33.4 s (ref 25.8–37.9)

## 2021-07-08 NOTE — Anesthesia Preprocedure Evaluation (Addendum)
Anesthesia Pre-operative History and Physical for Lance Peters  History and Physical Performed at [facility]      .  [facility] Summary:  Lance Peters presents preoperatively for anesthesia evaluation prior to RIGHT ARTHROPLASTY, KNEE, TOTAL (Right: Leg) on July 18, 2021 by  Dr Ardine Eng, at [facility] Main OR     He has a Past Medical History:  BMI 27.3  Arthritis: knee, elbows and shoulders   Depression  Neuromuscular disorder-elbows,shoulders   Hypertension-amlodipine   Hyperlipidemia-atorvastatin  Chest pain, unspecified in 2014   Angiomyolipoma of right kidney   Impaired fasting blood glucose            HgA1C-6.0-2021  POCT 14.8     The patient has no dyspnea, denies chest pain, is moderately active, climbs stairs and can lie flat    By Obie Dredge, NP at 9:09 AM on 07/08/2021    Anesthesia Evaluation Information Source: records, patient     ANESTHESIA HISTORY  Pertinent(-):  No History of anesthetic complications or Family hx of anesthetic complications    GENERAL    + Obesity  Comment: BMI 27.26     Covid in 2020 (September)-no hospitalization   Pertinent (-):  No Precautions, Anesthesia monitoring restrictions, infection, substance abuse, history of anesthetic complications or Family Hx of Anesthetic Complications    HEENT    + Visual Impairment          corrective lens for ADL    + Hearing Loss            Right Ear: HOH            Left Ear: HOH  Pertinent (-):  No glaucoma, TMJD, sinus issues, nosebleeds or neck pain PULMONARY    + Shortness of Breath (would get winded if had to climb steep hills)    + Recent Resp Infection (Had URI for 6 weeks (october-mid november, 2022)-no lingering symptoms)          URI, resolved    + Currently Active Environmental Allergies (pollen)  Pertinent(-):  No smoking, asthma, cough/congestion, snoring, sleep apnea, pulmonary testing or COPD    CARDIOVASCULAR  Good(4+METs) Exercise Tolerance    + Hypertension (amlodipine)          well controlled    + Cardiac Testing (completed when had chest  pain in 2014 )          stress echo  Pertinent(-):  No angina (chest pain in 2014), orthopnea, vascular Issues or hx of DVT    Comment: Has a very physical job, walks, 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     Can climb FOS w/out feeling SOB or chest pain      Doesn't see a cardiologist     GI/HEPATIC/RENAL       + GERD (PRN tums )          well controlled    + Renal Issues (Angiomyolipoma of right kidney )  Pertinent(-):  No PUD, nausea, vomiting, alcohol use, liver  issues, pancreatic issues, bowel issues or urinary issues    + Esophageal Issues (denies regurgitation) NEURO/PSYCH/ORTHO    + Headaches (r/t sinuses )          infrequent    + Dizziness/Motion Sickness (dizzy w/ getting up quickly-happened few weeks ago-never passed out, resolves quickly )    + Chronic pain          lower back    + Neuropsychiatric Issues  depression    + Peripheral Nerve Issue (right hand-tingling )          peripheral neuropathy, carpal tunnel    + Neuromuscular disease    Comment: 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 - no head trauma and no LOC  Pertinent(-):  No syncope, seizures or cerebrovascular event    ENDO/OTHER    + Diabetes Mellitus (impaired fasting glucose, pre diabetic  - does not check BGs at home)         HgA1c: 6.0     + Steroid Use (cortisone every 6 months (Last one september)-to both knees )  Pertinent(-):  No thyroid disease, pituitary disease, hormone use, chemo Hx    HEMATOLOGIC    + Anticoagulants/Antiplatelets          ASA    + Blood dyscrasia          hyperlipidemia    + Arthritis          knees, shoulders and lumbar  Pertinent(-):  No blood transfusion or autoimmune disease         Physical Exam    Airway            Mouth opening: normal            Mallampati: II            TM distance (fb): >3 FB            Neck ROM: full  Dental    Comment: Permanent upper bridge (front)  Denies loose or broken teeth  Has crowns  to molars   Cardiovascular           Rhythm: regular           Rate: normal  No peripheral edema or murmur      Neurologic    Normal Exam    General Survey    Normal Exam   Pulmonary     breath sounds clear to auscultation    Mental Status   Normal Exam    Operative Site    Normal Exam   Patient Education from [facility] Provider:  I saw and evaluated the patient. I agree with the student's findings and plan of care as documented above  I have interviewed and personally examined this patient and have reviewed and edited the database including HPI, PMH, Meds, FH, SH, and ROS as well as physical examination, assessment, and plans as recorded by Darci Needle NP student at [facility]  ________________________________________________________________________  PLAN      Possible ASA Score 2      Informed Consent     Risks:         Risks discussed were commensurate with the plan listed above with the following specific points: N/V, aspiration, sore throat and failed block, Damage to: eyes and teeth, allergic Rx and unexpected serious injury.    Anesthetic Consent:         Anesthetic plan (and risks as noted above) were discussed with patient

## 2021-07-09 LAB — HEMOGLOBIN A1C: Hemoglobin A1C: 6.3 % — ABNORMAL HIGH

## 2021-07-11 NOTE — Comprehensive Assessment (Signed)
Personal Contact/Support Systems - 07/11/21 [name]    Spokesperson Name Lance Peters     Relationship to Patient  Spouse     Phone Number(s) [phone]                Adult Social Work Assessment - 07/11/21 [name]     Marital status Married     Ethnicity/Race Caucasian     Primary Language English        Risk Factors    Risk Factors Adjustment to Dx/Injury/Illness        Health Care Directives Screen (Also required for Hospice Patients)    HCP Name Lance Peters     HCP Phone Number [phone]        Living Situation    Lives With Spouse     Can they assist patient after discharge? Yes   Taking a few days off       Home Geography    Type of Home 2 Story home     # Steps to Enter Home 1     Bedroom Second floor     Bathroom Second floor - full;First floor - half     Utilitites Working Yes        Baseline ADL functioning    Transfers Independent     Ambulation Independent     Assistive Device none     Bathing/Grooming Independent     Meal Prep Independent     Able to feed self? Yes        [name]    Current home equipment available --   Plans to borrow a RW, cane, and RTS prior to surgery       SW Home oxygen    Do you have home oxygen? No               SW spoke with patient to complete above.  Patient anticipates discharge to home with spouse.  Plans to borrow RW, cane, and RTS prior to surgery from friend who has had joint replacement.  Agreeable to home care referral, prefers [facility].  Referral initiated.      Lance Peters. [place], [place]  Pager: [phone]  Office Phone: [phone]  07/11/2021  2:57 PM

## 2021-07-13 ENCOUNTER — Ambulatory Visit: Payer: BLUE CROSS/BLUE SHIELD | Attending: Orthopedic Surgery

## 2021-07-13 DIAGNOSIS — Z01818 Encounter for other preprocedural examination: Secondary | ICD-10-CM

## 2021-07-13 DIAGNOSIS — Z20822 Contact with and (suspected) exposure to covid-19: Secondary | ICD-10-CM | POA: Insufficient documentation

## 2021-07-13 DIAGNOSIS — Z01812 Encounter for preprocedural laboratory examination: Secondary | ICD-10-CM | POA: Insufficient documentation

## 2021-07-13 DIAGNOSIS — Z20828 Contact with and (suspected) exposure to other viral communicable diseases: Secondary | ICD-10-CM | POA: Insufficient documentation

## 2021-07-13 LAB — COVID-19 NAAT (PCR): COVID-19 NAAT (PCR): NEGATIVE

## 2021-07-15 NOTE — Progress Notes (Signed)
Home Health Assessment    Completed by: Mellina Benison, RN  Phone: [phone]    Referred by: [facility]      Source of Information: chart review      Home Health indicators present: Therapy      Barriers to discharge to be addressed: none      Question Patient? No      Plan: Home care referral started; agency chosen: [facility]       Comments: [facility] has reviewed the patient's e-record. HCC will follow this patient's hospital course for home care needs; as well as to assist with a planning a safe [facility] discharge, and effective home care plan. Please call for questions or concerns regarding the discharge plan. Thank you for this referral.     Lastacia Solum, RN MSN  Home Care Coordinator  [facility]  Ph: [phone]  Fax: [phone]

## 2021-07-18 ENCOUNTER — Inpatient Hospital Stay
Admission: RE | Admit: 2021-07-18 | Discharge: 2021-07-19 | Disposition: A | Discharge status: Home Health | Payer: BLUE CROSS/BLUE SHIELD | Source: Ambulatory Visit | Attending: Orthopedic Surgery | Admitting: Orthopedic Surgery

## 2021-07-18 ENCOUNTER — Inpatient Hospital Stay: Payer: BLUE CROSS/BLUE SHIELD

## 2021-07-18 ENCOUNTER — Encounter
Admission: RE | Disposition: A | Discharge status: Home Health | Payer: Self-pay | Source: Ambulatory Visit | Attending: Orthopedic Surgery

## 2021-07-18 ENCOUNTER — Inpatient Hospital Stay: Payer: BLUE CROSS/BLUE SHIELD | Admitting: Internal Medicine

## 2021-07-18 ENCOUNTER — Other Ambulatory Visit: Payer: Self-pay

## 2021-07-18 DIAGNOSIS — M1711 Unilateral primary osteoarthritis, right knee: Secondary | ICD-10-CM | POA: Insufficient documentation

## 2021-07-18 DIAGNOSIS — M25761 Osteophyte, right knee: Secondary | ICD-10-CM | POA: Insufficient documentation

## 2021-07-18 DIAGNOSIS — Z96651 Presence of right artificial knee joint: Secondary | ICD-10-CM

## 2021-07-18 DIAGNOSIS — M25561 Pain in right knee: Secondary | ICD-10-CM

## 2021-07-18 DIAGNOSIS — E785 Hyperlipidemia, unspecified: Secondary | ICD-10-CM | POA: Insufficient documentation

## 2021-07-18 DIAGNOSIS — G8918 Other acute postprocedural pain: Secondary | ICD-10-CM

## 2021-07-18 DIAGNOSIS — Z471 Aftercare following joint replacement surgery: Secondary | ICD-10-CM

## 2021-07-18 DIAGNOSIS — I10 Essential (primary) hypertension: Secondary | ICD-10-CM | POA: Insufficient documentation

## 2021-07-18 HISTORY — PX: PR ARTHRP KNE CONDYLE&PLATU MEDIAL&LAT COMPARTMENTS: 27447

## 2021-07-18 LAB — BASIC METABOLIC PANEL
Anion Gap: 13 (ref 7–16)
CO2: 21 mmol/L (ref 20–28)
Calcium: 8.7 mg/dL (ref 8.6–10.2)
Chloride: 103 mmol/L (ref 96–108)
Creatinine: 0.91 mg/dL (ref 0.67–1.17)
Glucose: 205 mg/dL — ABNORMAL HIGH (ref 60–99)
Lab: 15 mg/dL (ref 6–20)
Potassium: 4 mmol/L (ref 3.3–5.1)
Sodium: 137 mmol/L (ref 133–145)
eGFR BY CREAT: 95 *

## 2021-07-18 LAB — CONFIRMATORY TYPE: Confirmatory ABORH: O NEG

## 2021-07-18 LAB — HCT AND HGB
Hematocrit: 45 % (ref 40–51)
Hemoglobin: 13.9 g/dL (ref 13.7–17.5)

## 2021-07-18 LAB — MCHC: MCHC: 31 g/dL — ABNORMAL LOW (ref 32–37)

## 2021-07-18 LAB — POCT GLUCOSE: Glucose POCT: 94 mg/dL (ref 60–99)

## 2021-07-18 SURGERY — ARTHROPLASTY, KNEE, TOTAL
Anesthesia: General | Site: Leg | Laterality: Right | Wound class: Clean

## 2021-07-18 MED ORDER — SODIUM CHLORIDE 0.9 % FLUSH FOR PUMPS *I*
0.0000 mL/h | INTRAVENOUS | Status: DC | PRN
Start: 2021-07-18 — End: 2021-07-19

## 2021-07-18 MED ORDER — ASPIRIN 81 MG PO TBEC *I*
81.0000 mg | DELAYED_RELEASE_TABLET | Freq: Two times a day (BID) | ORAL | 0 refills | Status: AC
Start: 2021-07-18 — End: 2021-08-16
  Filled 2021-07-18: qty 56, 28d supply, fill #0

## 2021-07-18 MED ORDER — BUPIVACAINE HCL 0.25 % IJ SOLUTION *WRAPPED*
Status: AC
Start: 2021-07-18 — End: 2021-07-18
  Filled 2021-07-18: qty 30

## 2021-07-18 MED ORDER — SODIUM CHLORIDE 0.9 % IR SOLN *I*
Status: DC | PRN
Start: 2021-07-18 — End: 2021-07-18
  Administered 2021-07-18: 200 mL

## 2021-07-18 MED ORDER — FAMOTIDINE 20 MG PO TABS *I*
20.0000 mg | ORAL_TABLET | Freq: Two times a day (BID) | ORAL | Status: DC
Start: 2021-07-18 — End: 2021-07-19
  Administered 2021-07-18 – 2021-07-19 (×2): 20 mg via ORAL
  Filled 2021-07-18 (×2): qty 1

## 2021-07-18 MED ORDER — CAMPHOR-MENTHOL 0.5-0.5 % EX LOTN *I*
TOPICAL_LOTION | CUTANEOUS | Status: DC | PRN
Start: 2021-07-18 — End: 2021-07-19

## 2021-07-18 MED ORDER — MIDAZOLAM HCL 1 MG/ML IJ SOLN *I* WRAPPED
INTRAMUSCULAR | Status: AC
Start: 2021-07-18 — End: 2021-07-18
  Filled 2021-07-18: qty 2

## 2021-07-18 MED ORDER — LUBIPROSTONE 24 MCG PO CAPS *I*
24.0000 ug | ORAL_CAPSULE | Freq: Two times a day (BID) | ORAL | Status: DC
Start: 2021-07-18 — End: 2021-07-19
  Administered 2021-07-18 – 2021-07-19 (×2): 24 ug via ORAL
  Filled 2021-07-18 (×4): qty 1

## 2021-07-18 MED ORDER — HALOPERIDOL LACTATE 5 MG/ML IJ SOLN *I*
0.5000 mg | Freq: Four times a day (QID) | INTRAMUSCULAR | Status: DC | PRN
Start: 2021-07-18 — End: 2021-07-19

## 2021-07-18 MED ORDER — SENNOSIDES 8.6 MG PO TABS *I*
2.0000 | ORAL_TABLET | Freq: Every evening | ORAL | 0 refills | Status: DC
Start: 2021-07-18 — End: 2021-09-03
  Filled 2021-07-18: qty 100, 50d supply, fill #0

## 2021-07-18 MED ORDER — POVIDONE-IODINE 5 % EX SOLN *I*
Freq: Once | CUTANEOUS | Status: AC
Start: 2021-07-18 — End: 2021-07-18

## 2021-07-18 MED ORDER — GABAPENTIN 300 MG PO CAPSULE *I*
300.0000 mg | ORAL_CAPSULE | Freq: Once | ORAL | Status: AC
Start: 2021-07-18 — End: 2021-07-18
  Administered 2021-07-18: 300 mg via ORAL
  Filled 2021-07-18: qty 1

## 2021-07-18 MED ORDER — DULOXETINE HCL 30 MG PO CPEP *I*
30.0000 mg | DELAYED_RELEASE_CAPSULE | Freq: Every day | ORAL | Status: DC
Start: 2021-07-19 — End: 2021-07-19
  Administered 2021-07-19: 30 mg via ORAL
  Filled 2021-07-18 (×2): qty 1

## 2021-07-18 MED ORDER — MELOXICAM 7.5 MG PO TABS *I*
15.0000 mg | ORAL_TABLET | Freq: Every day | ORAL | Status: DC
Start: 2021-07-19 — End: 2021-07-19
  Administered 2021-07-19: 15 mg via ORAL
  Filled 2021-07-18 (×2): qty 2

## 2021-07-18 MED ORDER — FENTANYL CITRATE 50 MCG/ML IJ SOLN *WRAPPED*
INTRAMUSCULAR | Status: AC
Start: 2021-07-18 — End: 2021-07-18
  Filled 2021-07-18: qty 2

## 2021-07-18 MED ORDER — ATORVASTATIN CALCIUM 20 MG PO TABS *I*
20.0000 mg | ORAL_TABLET | Freq: Every day | ORAL | Status: DC
Start: 2021-07-18 — End: 2021-07-19
  Administered 2021-07-18: 20 mg via ORAL
  Filled 2021-07-18 (×2): qty 1

## 2021-07-18 MED ORDER — DOCUSATE SODIUM 100 MG PO CAPS *I*
200.0000 mg | ORAL_CAPSULE | Freq: Every day | ORAL | Status: DC
Start: 2021-07-18 — End: 2021-07-19
  Administered 2021-07-18 – 2021-07-19 (×2): 200 mg via ORAL
  Filled 2021-07-18 (×2): qty 2

## 2021-07-18 MED ORDER — BUPIVACAINE HCL 0.25% IJ SOLN (FOR OSM) *I*
Status: DC | PRN
Start: 2021-07-18 — End: 2021-07-18
  Administered 2021-07-18: 30 mL via PERINEURAL

## 2021-07-18 MED ORDER — ASPIRIN 81 MG PO TBEC *I*
81.0000 mg | DELAYED_RELEASE_TABLET | Freq: Two times a day (BID) | ORAL | Status: DC
Start: 2021-07-18 — End: 2021-07-19
  Administered 2021-07-18 – 2021-07-19 (×2): 81 mg via ORAL
  Filled 2021-07-18 (×5): qty 1

## 2021-07-18 MED ORDER — OXYCODONE HCL 5 MG PO TABS *I*
5.0000 mg | ORAL_TABLET | ORAL | Status: DC | PRN
Start: 2021-07-18 — End: 2021-07-19
  Administered 2021-07-18 – 2021-07-19 (×4): 5 mg via ORAL
  Filled 2021-07-18 (×4): qty 1

## 2021-07-18 MED ORDER — FAMOTIDINE 20 MG PO TABS *I*
20.0000 mg | ORAL_TABLET | Freq: Two times a day (BID) | ORAL | 0 refills | Status: DC
Start: 2021-07-18 — End: 2021-11-19
  Filled 2021-07-18: qty 60, 30d supply, fill #0

## 2021-07-18 MED ORDER — CALCIUM CARBONATE ANTACID 500 MG PO CHEW *I*
1000.0000 mg | CHEWABLE_TABLET | Freq: Three times a day (TID) | ORAL | Status: DC | PRN
Start: 2021-07-18 — End: 2021-07-19

## 2021-07-18 MED ORDER — SODIUM CHLORIDE 0.9 % IV SOLN WRAPPED *I*
20.0000 mL/h | Status: DC
Start: 2021-07-18 — End: 2021-07-18

## 2021-07-18 MED ORDER — ALBUTEROL SULFATE (2.5 MG/3ML) 0.083% IN NEBU *I*
2.5000 mg | INHALATION_SOLUTION | Freq: Once | RESPIRATORY_TRACT | Status: DC | PRN
Start: 2021-07-18 — End: 2021-07-18

## 2021-07-18 MED ORDER — TRANEXAMIC ACID 1000 MG / 0.7% NACL 100 ML IVPB *I*
1000.0000 mg | Freq: Once | INTRAVENOUS | Status: AC
Start: 2021-07-18 — End: 2021-07-18
  Administered 2021-07-18: 1000 mg via INTRAVENOUS
  Filled 2021-07-18: qty 100

## 2021-07-18 MED ORDER — GABAPENTIN 300 MG PO CAPSULE *I*
300.0000 mg | ORAL_CAPSULE | Freq: Every evening | ORAL | Status: DC
Start: 2021-07-18 — End: 2021-07-19
  Administered 2021-07-18: 300 mg via ORAL
  Filled 2021-07-18: qty 1

## 2021-07-18 MED ORDER — SODIUM CHLORIDE 0.9 % IV SOLN WRAPPED *I*
125.0000 mL/h | Status: DC
Start: 2021-07-18 — End: 2021-07-19
  Administered 2021-07-18: 125 mL/h via INTRAVENOUS

## 2021-07-18 MED ORDER — DEXAMETHASONE SODIUM PHOSPHATE 4 MG/ML INJ SOLN *WRAPPED*
INTRAMUSCULAR | Status: DC | PRN
Start: 2021-07-18 — End: 2021-07-18
  Administered 2021-07-18: 4 mg via INTRAVENOUS

## 2021-07-18 MED ORDER — BUPIVACAINE HCL 0.25 % IJ SOLUTION *WRAPPED*
Status: AC
Start: 2021-07-18 — End: 2021-07-18
  Filled 2021-07-18: qty 60

## 2021-07-18 MED ORDER — SERTRALINE HCL 50 MG PO TABS *I*
50.0000 mg | ORAL_TABLET | Freq: Every day | ORAL | Status: DC
Start: 2021-07-19 — End: 2021-07-19
  Administered 2021-07-19: 50 mg via ORAL
  Filled 2021-07-18 (×2): qty 1

## 2021-07-18 MED ORDER — PROPOFOL 10 MG/ML IV EMUL (INTERMITTENT DOSING) WRAPPED *I*
INTRAVENOUS | Status: AC
Start: 2021-07-18 — End: 2021-07-18
  Filled 2021-07-18: qty 20

## 2021-07-18 MED ORDER — KETOROLAC TROMETHAMINE 30 MG/ML IJ SOLN *I*
INTRAMUSCULAR | Status: AC
Start: 2021-07-18 — End: 2021-07-18
  Filled 2021-07-18: qty 1

## 2021-07-18 MED ORDER — POLYETHYLENE GLYCOL 3350 PO PACK 17 GM *I*
17.0000 g | PACK | Freq: Every day | ORAL | Status: DC
Start: 2021-07-18 — End: 2021-07-19
  Administered 2021-07-18 – 2021-07-19 (×2): 17 g via ORAL
  Filled 2021-07-18 (×2): qty 17

## 2021-07-18 MED ORDER — PROPOFOL INFUSION 10 MG/ML *I*
INTRAVENOUS | Status: DC | PRN
Start: 2021-07-18 — End: 2021-07-18
  Administered 2021-07-18: 50 ug/kg/min via INTRAVENOUS
  Administered 2021-07-18: 25 ug/kg/min via INTRAVENOUS
  Administered 2021-07-18: 50 ug/kg/min via INTRAVENOUS

## 2021-07-18 MED ORDER — ACETAMINOPHEN 500 MG PO TABS *I*
1000.0000 mg | ORAL_TABLET | Freq: Once | ORAL | Status: AC
Start: 2021-07-18 — End: 2021-07-18
  Administered 2021-07-18: 1000 mg via ORAL
  Filled 2021-07-18: qty 2

## 2021-07-18 MED ORDER — MAGNESIUM HYDROXIDE 400 MG/5ML PO SUSP *I*
30.0000 mL | Freq: Every day | ORAL | Status: DC | PRN
Start: 2021-07-18 — End: 2021-07-19
  Filled 2021-07-18: qty 30

## 2021-07-18 MED ORDER — DEXTROSE 5 % FLUSH FOR PUMPS *I*
0.0000 mL/h | INTRAVENOUS | Status: DC | PRN
Start: 2021-07-18 — End: 2021-07-19

## 2021-07-18 MED ORDER — CEFAZOLIN 2000 MG IN STERILE WATER 20ML SYRINGE *I*
2000.0000 mg | PREFILLED_SYRINGE | Freq: Once | INTRAVENOUS | Status: AC
Start: 2021-07-18 — End: 2021-07-18
  Administered 2021-07-18: 2000 mg via INTRAVENOUS
  Filled 2021-07-18: qty 20

## 2021-07-18 MED ORDER — DEXAMETHASONE SODIUM PHOSPHATE 4 MG/ML INJ SOLN *WRAPPED*
INTRAMUSCULAR | Status: DC | PRN
Start: 2021-07-18 — End: 2021-07-18
  Administered 2021-07-18: 4 mg via PERINEURAL

## 2021-07-18 MED ORDER — ONDANSETRON HCL 2 MG/ML IV SOLN *I*
4.0000 mg | Freq: Four times a day (QID) | INTRAMUSCULAR | Status: DC | PRN
Start: 2021-07-18 — End: 2021-07-19

## 2021-07-18 MED ORDER — HALOPERIDOL LACTATE 5 MG/ML IJ SOLN *I*
0.5000 mg | Freq: Once | INTRAMUSCULAR | Status: DC | PRN
Start: 2021-07-18 — End: 2021-07-18

## 2021-07-18 MED ORDER — CEFAZOLIN 2000 MG IN STERILE WATER 20ML SYRINGE *I*
2000.0000 mg | PREFILLED_SYRINGE | Freq: Three times a day (TID) | INTRAVENOUS | Status: AC
Start: 2021-07-18 — End: 2021-07-19
  Administered 2021-07-18 – 2021-07-19 (×2): 2000 mg via INTRAVENOUS
  Filled 2021-07-18 (×2): qty 20

## 2021-07-18 MED ORDER — LACTATED RINGERS IV SOLN *I*
125.0000 mL/h | INTRAVENOUS | Status: DC
Start: 2021-07-18 — End: 2021-07-18
  Administered 2021-07-18: 125 mL/h via INTRAVENOUS

## 2021-07-18 MED ORDER — MORPHINE SULFATE ER 15 MG PO TBCR *I*
15.0000 mg | ORAL_TABLET | Freq: Two times a day (BID) | ORAL | Status: DC
Start: 2021-07-18 — End: 2021-07-19
  Administered 2021-07-18 – 2021-07-19 (×2): 15 mg via ORAL
  Filled 2021-07-18 (×2): qty 1

## 2021-07-18 MED ORDER — BUPIVACAINE IN DEXTROSE 0.75-8.25 % IT SOLN *I*
INTRATHECAL | Status: DC | PRN
Start: 2021-07-18 — End: 2021-07-18
  Administered 2021-07-18: 1.2 mL via INTRATHECAL

## 2021-07-18 MED ORDER — ACETAMINOPHEN 500 MG PO TABS *I*
1000.0000 mg | ORAL_TABLET | Freq: Three times a day (TID) | ORAL | Status: DC
Start: 2021-07-18 — End: 2021-07-19
  Administered 2021-07-18 – 2021-07-19 (×3): 1000 mg via ORAL
  Filled 2021-07-18 (×3): qty 2

## 2021-07-18 MED ORDER — ONE STEP DOSE TO ADMINISTER *I*
INTRAMUSCULAR | Status: DC | PRN
Start: 2021-07-18 — End: 2021-07-18
  Administered 2021-07-18: 61 mL via INTRA_ARTICULAR

## 2021-07-18 MED ORDER — OXYCODONE HCL 5 MG PO TABS *I*
10.0000 mg | ORAL_TABLET | ORAL | Status: DC | PRN
Start: 2021-07-18 — End: 2021-07-19

## 2021-07-18 MED ORDER — DOCUSATE SODIUM 100 MG PO CAPS *I*
200.0000 mg | ORAL_CAPSULE | Freq: Every day | ORAL | 0 refills | Status: DC
Start: 2021-07-18 — End: 2021-09-03
  Filled 2021-07-18: qty 100, 50d supply, fill #0

## 2021-07-18 MED ORDER — HYDROMORPHONE HCL PF 1 MG/ML IJ SOLN *WRAPPED*
0.5000 mg | INTRAMUSCULAR | Status: DC | PRN
Start: 2021-07-18 — End: 2021-07-18

## 2021-07-18 MED ORDER — FENTANYL CITRATE 50 MCG/ML IJ SOLN *WRAPPED*
INTRAMUSCULAR | Status: DC | PRN
Start: 2021-07-18 — End: 2021-07-18
  Administered 2021-07-18 (×2): 50 ug via INTRAVENOUS

## 2021-07-18 MED ORDER — DEXAMETHASONE SODIUM PHOSPHATE 4 MG/ML INJ SOLN *WRAPPED*
INTRAMUSCULAR | Status: AC
Start: 2021-07-18 — End: 2021-07-18
  Filled 2021-07-18: qty 2

## 2021-07-18 MED ORDER — ASPIRIN 81 MG PO TBEC *I*
81.0000 mg | DELAYED_RELEASE_TABLET | Freq: Every day | ORAL | Status: AC
Start: 2021-07-18 — End: ?

## 2021-07-18 MED ORDER — PROPOFOL 10 MG/ML IV EMUL (INTERMITTENT DOSING) WRAPPED *I*
INTRAVENOUS | Status: DC | PRN
Start: 2021-07-18 — End: 2021-07-18
  Administered 2021-07-18: 140 mg via INTRAVENOUS

## 2021-07-18 MED ORDER — LACTATED RINGERS IV SOLN *I*
20.0000 mL/h | INTRAVENOUS | Status: DC
Start: 2021-07-18 — End: 2021-07-18
  Administered 2021-07-18: 20 mL/h via INTRAVENOUS

## 2021-07-18 MED ORDER — ONDANSETRON HCL 2 MG/ML IV SOLN *I*
INTRAMUSCULAR | Status: DC | PRN
Start: 2021-07-18 — End: 2021-07-18
  Administered 2021-07-18: 4 mg via INTRAVENOUS

## 2021-07-18 MED ORDER — ONDANSETRON HCL 2 MG/ML IV SOLN *I*
INTRAMUSCULAR | Status: AC
Start: 2021-07-18 — End: 2021-07-18
  Filled 2021-07-18: qty 2

## 2021-07-18 MED ORDER — CELECOXIB 200 MG PO CAPS *I*
200.0000 mg | ORAL_CAPSULE | Freq: Once | ORAL | Status: AC
Start: 2021-07-18 — End: 2021-07-18
  Administered 2021-07-18: 200 mg via ORAL
  Filled 2021-07-18: qty 1

## 2021-07-18 MED ORDER — SENNOSIDES 8.6 MG PO TABS *I*
2.0000 | ORAL_TABLET | Freq: Every evening | ORAL | Status: DC
Start: 2021-07-18 — End: 2021-07-19
  Administered 2021-07-18: 2 via ORAL
  Filled 2021-07-18: qty 2

## 2021-07-18 MED ORDER — LIDOCAINE HCL (PF) 1 % IJ SOLN *I*
0.1000 mL | INTRAMUSCULAR | Status: DC | PRN
Start: 2021-07-18 — End: 2021-07-18
  Filled 2021-07-18: qty 2

## 2021-07-18 MED ORDER — CHLORHEXIDINE GLUCONATE 0.12 % MT SOLN *STORES SUPPLIED*
15.0000 mL | Freq: Once | OROMUCOSAL | Status: AC
Start: 2021-07-18 — End: 2021-07-18
  Administered 2021-07-18: 15 mL via OROMUCOSAL

## 2021-07-18 MED ORDER — MIDAZOLAM HCL 1 MG/ML IJ SOLN *I* WRAPPED
INTRAMUSCULAR | Status: DC | PRN
Start: 2021-07-18 — End: 2021-07-18
  Administered 2021-07-18: 2 mg via INTRAVENOUS

## 2021-07-18 SURGICAL SUPPLY — 41 items
ADHESIVE SKIN CLOSURE 0.7ML DERMABOND ADVANCED (Supply) ×2 IMPLANT
APPLICATOR CHLORAPREP STER  ORANGE 26ML (Supply) ×6 IMPLANT
APPLICATOR CHLORAPREP STER 26ML (Supply) ×2 IMPLANT
BANDAGE ELAST SLF-CLSR 6X11 LF NONSTER (Dressing) ×2 IMPLANT
BLADE SAG AGRESSIVE TOOTH (Supply) ×2 IMPLANT
BLADE SURG SAW SAGITTAL SS L 90 MM THICKNESS 1.27 MM W 13 MM (Supply) ×2 IMPLANT
CEMENT REFOBACIN R W GENT 1X40 (Implant) ×4 IMPLANT
CLOSURE STERI-STRIP REINF .5 X 4IN LF (Dressing) ×2 IMPLANT
DRAPE SHEET 40X70 MED (Drape) ×2 IMPLANT
DRAPE SUR W70XL100IN STD SMS POLYPR FULL SHT W/O FLD PCH DISP (Drape) ×2 IMPLANT
DRAPE SURG IOBAN 2 ANTIMIC LG 23 X 17IN (Drape) ×4 IMPLANT
DRAPE SURG TOWEL LG 18 X 24IN (Drape) ×2 IMPLANT
DRESSING MEPILEX POST OP 4X12 (Dressing) ×2 IMPLANT
DRILL QUICK RELEASE STERILE PK2 1/8 (Supply) ×4 IMPLANT
FILTER NEPTUNE 4PORT MANIFOLD (Supply) ×2 IMPLANT
GLOVE SURG BIOGEL PI ULTRATOUCH SZ 7.5 (Glove) ×8 IMPLANT
GLOVE SURG GAMMEX NON-LATEX ORTHO PI SZ 8 (Glove) ×2 IMPLANT
GOWN SIRUS FABRIC REINFORCED SET IN XL (Gown) ×2 IMPLANT
IMPLANT VANGUARD CR ILOK FEM RT 65 (Implant) ×2 IMPLANT
IMPLANT VANGUARD CR TIBIAL BRG 10X71/75 (Implant) ×2 IMPLANT
MIXER CEMENT W/FEM NOZZLE (Supply) ×2 IMPLANT
PACK CUSTOM TOTAL KNEE (Pack) ×2 IMPLANT
PAD KNEE POSITIONER BOOT STERILE (Supply) ×2 IMPLANT
PEN SURG MARKER REG TIP (Supply) ×2 IMPLANT
SLEEVE COMP KNEE MED BLENDED (Supply) ×2 IMPLANT
SOL POVIDONE STERILE 10 PCT 3/4OZ (Solution) ×2 IMPLANT
SOL SOD CHL IRRIG 500ML BTL (Solution) ×2 IMPLANT
SOL SOD CHL IV .9PCT 1000ML BAG (Drug) ×2 IMPLANT
SOL WATER IRRIG STERILE 1000ML BTL (Solution) ×2 IMPLANT
SPIKE MINI DISPENSING PIN (BBRAUN DP1000) (Supply) ×2 IMPLANT
STEM TIB L40MM KNEE PRI FIN VANGUARD COMPLT SYS (Implant) ×2 IMPLANT
SUCTION YANKAUER HIGH CAP. (Supply) ×2 IMPLANT
SUTR MONOCRYL PLUS 3-0PS2 27IN UNDY (Suture) ×2 IMPLANT
SUTR QUILL MONODERM PREC 2-0 24MM 30X30 (Suture) ×2 IMPLANT
SUTR VICRYL ANTIB 1 CTX 18 POP VIOLET (Suture) ×2 IMPLANT
SUTURE QUILL SZ 2 L2IN ABSRB VLT L48MM 1/2 CIR DA TAPR CUT NDL POLYDIOXANONE MFIL (Suture) ×2 IMPLANT
SYRINGE  LUER LOCK  STERILE  60ML (Syringe) ×2
SYRINGE 60ML  LUER LOCK  STERILE (Syringe) ×2 IMPLANT
SYRINGE IRRIG 60ML BULB SOFT PLIABLE ONE HANDED USE W/TYVEK LID (Syringe) ×2 IMPLANT
TAPE SURG TRANSPORE 1IN X 10YD LF (Supply) ×2 IMPLANT
TRAY TIBIAL INTRLOK PRI 71MM (Implant) ×2 IMPLANT

## 2021-07-18 NOTE — Anesthesia Procedure Notes (Signed)
----------------------------------------------------------------------------------------------------------------------------------------    Adductor Canal Nerve Block      Date of Procedure: 07/18/2021 12:00 PM    Laterality:  Right    Injection Technique: Single-shot    Reason for Block: Post-op pain management and At Surgeon's request        Patient Location: Pre-op  CONSENT AND TIMEOUT     Consent:  Obtained per policy    Timeout: patient identified (name/DOB) , nerve block procedure/site/side verified by patient or family, proper patient position verified, operative procedure/site/side verified by patient or family , block site initialed by attending, needed equipment, monitors, medications and access verified as present and functioning , allergies reviewed with patient/record  and anticoagulation/antiplatelet status reviewed    Staff involved in Time Out (excluding authors of note):  Dr. Esmeralda Links  METHOD     Patient Position:  Supine    Monitoring:  Blood pressure, Continuous pulse oximetry and EKG    Sedation Used:  Yes    Level of Sedation: Moderate              for meds injected see MAR portion of chart      Sterile Technique:  Hand sanitizing, hat and mask    Prep:  Aseptic technique per protocol and Chloraprep      Approach:  In-plane and Anterior    Technique: Ultrasound guided       Attempts (Skin Punctures):  2   NEEDLE     Type:  Short-bevel     Gauge: 22 G     Length: 8 cm  BLOCK EVENTS      No Paresthesia with needle     No Paresthesia with injection     No significant resistance to injection     No significant pain on injection     No blood aspirated     No intravascular injection     Well Tolerated  STAFF     Performed by  Fleet Contras, MD  ----------------------------------------------------------------------------------------------------------------------------------------

## 2021-07-18 NOTE — Progress Notes (Signed)
Lance Peters was seen and evaluated at bedside in pre-op holding, H&P reviewed, no changes noted.  We reviewed risks related to the procedure including infection requiring further surgery and prolonged morbidity and in extreme cases loss of limb, bleeding, nerve injury resulting in permanent leg weakness/foot drop & gait impairment, blood vessel injury requiring further surgery and in extreme cases loss of limb, iatrogenic femur/tibia fracture, knee stiffness, wound drainage, risk of anesthesia, risk of exacerbation of underlying medical issues resulting in ileus, heart attack, stroke, respiratory failure, kidney failure, blood clots in the legs and or lungs, and even death. The patient understood these risks and elected to proceed. Informed consent was obtained verbally, the surgical site initialed, plan to proceed with right TKA.    Lance Phenix, MD 07/18/2021 11:01 AM

## 2021-07-18 NOTE — Progress Notes (Signed)
Physical Therapy    Treatment session completed.      Is another physical therapy session necessary prior to hospital discharge:  Yes    Discharge recommendation:  Anticipate return to prior living arrangement, Intermittent supervision/assist, Home PT  Equipment recommendations upon discharge: None  Mobility recommendations for nursing while in hospital: 1A with RW     PT Adult Assessment - 07/18/21 1827        PT Tracking    PT TRACKING PT Assigned     Type of Session follow up/treatment     SW Request? No        Treatment Day    Treatment Day (HH / [facility]) 1        Precautions/Observations    Precautions used Yes     Weight Bearing Status Right Lower Extremity - weight bearing as tolerated     LDA Observation IV lines     Was patient wearing a mask? Yes     PPE worn by Architectural technologist;Face Shield;Mask     Fall Precautions General falls precautions;Patient educated to use call light for nursing assist prior to getting up        Pain Assessment    *Is the patient currently in pain? Yes     Pain (Before,During, After) Therapy Before     0-10 Scale 4     Pain Location Knee     Pain Orientation Right     Pain Descriptors Aching     Pain Intervention(s) Cold applied;Repositioned;Refer to nursing for pain management        Cognition    Cognition Tested     Arousal/Alertness Appropriate responses to stimuli     Orientation Did not assess     Ability to Follow Instructions Follows all commands and directions without difficulty        Bed Mobility    Bed mobility Not tested     Additional comments patient presented and departed in recliner        Transfers    Transfers Tested     Stand Pivot Transfers Stand-by assist;1 person     Sit to Stand Contact guard;1 person assist     Stand to sit Supervision;1 person assist     Transfer Assistive Device rolling walker     Additional comments instructed on transfers to/from recliner. Initially upon standing from recliner unsteadiness noted due to moving too quickly, requiring CGA and  patient then able to steady self with RW        Mobility    Mobility Tested     Weight Bearing Status Right Lower Extremity - weight bearing as tolerated     Gait Pattern 3 point;Antalgic right side;Decreased cadence;Decreased R step length;Decreased R step height;Decreased L step length;Decreased L step height;Decreased stance time R     Ambulation Assist Supervision;1 person assist     Ambulation Distance (Feet) 130     Ambulation Assistive Device rolling walker     Stairs Assistance Stand by;1 person assist     Stair Management Technique One rail;Step to pattern;Forwards;With cane     Number of Stairs 12     Platform Step Stand by;Supervision;1person assist     Additional comments instructed on amb in room and hallway, no loss of balance or unsteadiness noted. Instructed on ascending/descending platform step 2x with RW, verbal cues for sequencing with good follow through. Then instructed on ascending/descending 12 steps with R rail and cane with good follow through of step to pattern  except once patient led with incorrect leg when ascending requiring CGA to steady patient.        Family/Caregiver Training`    Patient/Family/Caregiver training Yes     Patient training Role of physical therapy in hospital and plan for evaluation and follow up;Discharge planning;Use of assistive device;Therapeutic exercises, including recommendation of frequency of therapeutic exercises to be completed by patient throughout day;Call don't fall, purpose of bed/chair alarm, and recommendation for nursing assistance during all oob mobility        Therapeutic Exercises    Additional comments provided and instructed patient on walking program handout        Balance    Balance Tested     Sitting - Static Independent;Unsupported     Sitting - Dynamic Independent;Supported     Standing - Static Standby assist;Supported        Functional Outcome Measures    Functional Outcome Measures Yes        PT AM-PAC Mobility    Turning over in bed? 3      Sitting down on and standing up from a chair with arms? 3     Moving from lying on back to sitting on the side of the bed? 3     Moving to and from a bed to a chair? 3     Need to walk in hospital room? 3     Climbing 3 - 5 steps with a railing? 3     Total Raw Score 18     Standardized Score - Calculated 43.63     % Functional Impairment - Calculated 47%%        Assessment    Brief Assessment Remains appropriate for skilled therapy     Problem List Impaired LE ROM;Impaired LE strength;Impaired balance;Impaired transfers;Impaired ambulation;Impaired bed mobility;Impaired stair navigation;Impaired functional mobility;Pain contributing to impairment     Overall Assessment Patient progressed independence with amb and began stair training this session. At times patient is very quick with his movements resulting in some unsteadiness. Anticipate 1 additional session to ensure safety and independence with mobility        Plan/Recommendation    PT Treatment Interventions Restorative PT;AROM;Bed mobility training;Transfers training;Balance training;Stair training;Pt/Family education;Gait training;Home exercise program instruction;D/C planning;Will work to minimize pain while promoting mobility whenever possible     PT Frequency 5-7x/wk;BID Visit     PT Mobility Recommendations 1A with RW     PT Referral Recommendations OT;SW;Home care     PT Discharge Recommendations Anticipate return to prior living arrangement;Intermittent supervision/assist;Home PT     PT Discharge Equipment Recommended None     PT Assessment/Recommendations Reviewed With: Patient;Nursing     Next PT Visit progress per protocol     PT needs to see patient prior to DC  Yes        Time Calculation    PT Timed Codes 20     PT Untimed Codes 0     PT Unbilled Time 0     PT Total Treatment 20        Plan and Onset date    Plan of Care Date 07/18/21     Onset Date 07/18/21     Treatment Start Date 07/18/21               AMPAC Score Interpretation:  Adapted  from Worthy Flank, et al Association of AM-PAC 6 Clicks Basic Mobility and Daily Activity Scores with discharge destination, 2021  AM-PAC Raw Score of > 16 (  standardized score > 40.78, % impairment <  54%) indicates a high likelihood of discharge to home     Adapted from Corwin Levins al Prediction of disposition within 48-hours of hospital admission using patient mobility scores, 2019  AM-PAC raw score  ? 15 (standardized score ? 39, % impairment  < 58%) indicates a high probability of a discharge to home  AM-PAC raw score 9-14 (standardized score ? 31, % impairment 81%-61%) and age < 73 is more likely to discharge to home  AM-PAC raw score 9-14 (standardized score ? 31, % impairment 81%-61%) and age > 86 is more likely to require post-acute care  AM-PAC raw score < 9 (standardized score < 31, % impairment > 81%) indicates pt is more likely to require post-acute care    Eino Farber PT, DPT  Web page or secure chat Eino Farber

## 2021-07-18 NOTE — Op Note (Signed)
OPERATIVE NOTE    Date of Surgery:  07/18/2021  Surgeon:  Arley Phenix, MD  First Assistant:  Percell Locus, MD PGY III  Second Assistant: R. Favro, RNFA    Pre-Op Diagnosis:    Right knee DJD    Deformity:  1. Varus: mild   2. Flexion contracture: 5 degrees    Anesthesia Type:    Spinal, Regional, General    Post-Op Diagnosis:  Primary:  Same as preoperative     Procedure(s) Performed :  Right TKA (17616)    Soft Tissue Release:  1.  MCL    Intraoperative Findings:  1. End Stage right knee DJD    Implants:  1.  [employer] Vanguard CR femur size 65 mm  2.  Unresurfaced  3.  [employer] primary tibial base plate size 71 mm, 40 mm modular finned stem  4.  [employer] CR polyethylene size 10 mm  5.  [employer] Refobacin cement      Fixation:  1. Femur: Surface cement  2. Tibia: Surface cement    Estimated Blood Loss:  200  Specimens to Pathology:  No  Tourniquet Time: NA   Patient Condition:  good    Indications:   Lance Peters is a 61 y.o. male with end stage right knee DJD refractory to conservative treatment, who elected to receive right  total knee arthroplasty.    Description of Procedure:  Lance Peters was identified in the preoperative holding area and the intended surgical site was marked with the initials "JG". Risks related to this procedure were reviewed with the patient. These risks include but are not limited to infection requiring further surgery and in extreme cases loss of limb, bleeding requiring blood transfusion, nerve injury resulting in foot drop, blood vessel injury, iatrogenic femur/tibia fracture, eventual component failure, post-operative knee stiffness requiring future manipulation, need for further surgery, risks of anesthesia, risk of exacerbation of underlying medical conditions including blood clots in the legs and/or lungs, ileus, heart attack, stroke, respiratory failure, and even death. The patient understood these risks and elected to proceed.  Informed consent was obtained,  surgical site marked. The patient was escorted to the operative suite, and was placed supine on the operating table. Following induction of anesthesia, all bony prominences were well padded. The patient was then prepped and draped in the normal sterile fashion.  Following the standard pre-surgical pause and administration of pre-operative antibiotics, a longitudinal incision slightly medial to the tibial tubracle was made. Dissection was carried sharply down to the level of the quadriceps tendon and patellar retinaculum.  A deep knife was utilized to create a medial parapatellar arthrotomy.  The intraoperative findings listed above were noted. The patella was everted, and the thickness measured with calipers. Synovectomy and lateral facet release completed. Resection amount was confirmed with caliper measurement. Medial exposure was completed, and the anterior horn of the medial and lateral meniscus were ressected along with a portion of the prepatellar fat pad, and ACL. Whiteside's line was marked along with the intramedullary starting position with bovie electrocautery. The femoral canal was perforated with a starting drill, suctioned, and the intramedullary alignment guide inserted through the 5 degree right paddle. The paddle was pinned into position in the appropriate rotation. The distal femoral cutting guide was inserted into the 5 degree right paddle, and pinned. The paddle was then removed along with the intramedullary alignment rod and the distal femoral resection was completed with an oscillating saw blade. The distal femoral resection  was measured with caliper to confirm appropriate resection.  The paddle & IM rod were reinserted to confirm plane os resection. The external rotation/sizing guide was then placed referencing the posterior condylar axis, and the femur was sized and drilled at 3 degrees of external rotation. Rotation was confirmed by referencing anatomic landmarks.  The 65 size 4-1 femoral  cutting block was impacted into place and secured with drillable pins. All important ligamentous and tendinous structures were protected. Anterior resection, posterior resection, posterior chamfer, and anterior chamfer cuts were made with an oscillating saw blade and the cutting block removed. Femoral resections were cleared with hand tools and femoral osteophytes were removed with a rongeur. A PCL retractor was inserted to deliver the tibia anterior and protect neurovascular structures. The medial & lateral meniscal remnants were removed, PCL recessed, and the tibia cutting guide positioned referencing 8 mm from the high point of the lateral plateau. Once the tibia guide was correctly oriented in the vertical,coronal, axial, and sagittal planes the cutting block was secured with three drillable pins.  The tibia was resected with an oscillating saw blade.  The appropriately sized tibial base plate was selected, oriented in the correct rotation, and secured with pins x 2.  Propper coronal resection was confirmed with an alignment rod through paddle attached to the trial baseplate. Final preparation was completed by attaching the tibial tower to the baseplate, completing the opening reaming through the tower, followed by impacting the finned punch with mallet. Posterior osteophytes were cleared with hand tools, the flexion gap tensioned  with hand, and assessed for symmetry. The trial femur placed.   The 10 &12 polyethylene liners were trialed. The soft tissue releases listed above were performed. Additional 2 mm of distal femur removed, chamfer cuts completed. The final liner listed above was selected which provided the best stability in both the coronal and anterior posterior planes. The patella tracking was noted to be appropriate.  With all trial components in place the knee was noted to be stable in flexion and extension. Range of motion was from full extension to greater than 120 degrees of flexion. All  components were removed, bony surfaces copiously irrigated and dried with a lap spounge. Cement technique include application of cement to both clean implant and bone surfaces, as well as pressurization of bone application with cement gun & hand tools. The final implants listed above were inserted. Knee explored and extraneous cement was removed. The knee was reassessed for stability, ROM, and patellar tracking all of which were appropriate. The knee was copiously irrigated, soaked in sterile betadine and closed in layers. The arthrotomy was closed in a semi-flexed position utilizing  #1 Vicryl suture then Quill. The knee was injected with .25% marcaine with epinephrine & Toradol. Patella tracking was reassessed. The remaining wound was closed with 2-0 Quill for the subcutaneous tissue, staples, and dermabond for skin. A sterile dressing was placed and drapes removed. The patient was weaned from his sedation and taken to the PACU in stable condition. There were no complications noted.    Infection Prevention Measurers:  SA/MRSA Screening  Antibiotics  Foot/toe covering  CHG clear Pre scrub  Positive Pressure Suiting with sleeves ioban sealed  CHG orange Prep  4 layers sterile draping including 1 impervious layer  CHG reprep following draping  Glove change prior to incision  Ioban layer  Efficient surgery time  Glove change prior to implant insertion  Suction tip change prior to implant insertion  Sterile betadine soak following implant  insertion  Dermabond occlusive dressing      Signed:  Arley Phenix, MD on 07/18/2021 at 1:45 PM

## 2021-07-18 NOTE — Anesthesia Procedure Notes (Signed)
---------------------------------------------------------------------------------------------------------------------------------------    NEURAXIAL BLOCK PLACEMENT  Single Shot Spinal    Date of Procedure: 07/18/2021 12:12 PM    Patient Location:  OR    Reason for Block: at surgeon's request and intraoperative anesthetic    CONSENT AND TIMEOUT     Consent:  Obtained per policy    Timeout: patient identified (name/DOB) , nerve block procedure/site/side verified by patient or family, proper patient position verified, operative procedure/site/side verified by patient or family , needed equipment, monitors, medications and access verified as present and functioning , allergies reviewed with patient/record  and anticoagulation/antiplatelet status reviewed  METHOD:    Patient Position: sitting    Monitoring: blood pressure and continuous pulse oximetry      Labeling: syringes appropriately labeled    Sedation Used: yes            For medications used, please see MAR        Level of Sedation: mild      Sterile Technique:  Hand sanitizing, hat, mask and sterile gloves    Prep: aseptic technique per protocol, povidone-iodine and site draped      Successful Approach: midline    Successful Location: L3-4    Attempts (Skin Punctures):  1     Lot Number: [id number]; Expiration Date: 06/05/22  SPINAL NEEDLE:      Introducer: 20 G    Type: Pencan    Gauge: 25 G    Length: 3.5 in    CSF Appearance: clear  OBSERVATIONS:    Block Completion:  Successfully completed      Paresthesia: none      Neuraxial Blood: blood not aspirated      Sensory Levels: bilateral        Motor Block: dense      Patient Reaction to Block: tolerated procedure well and vitals remained stable    STAFF     Performed by  Fleet Contras, MD  ----------------------------------------------------------------------------------------------------------------------------------------

## 2021-07-18 NOTE — Progress Notes (Signed)
Report Given To   Anise RN        Descriptive Sentence / Reason for Admission     RIGHT ARTHROPLASTY, KNEE, TOTAL - Right  Arley Phenix, MD      Active Issues / Relevant Events   Patient received Bupivacaine spinal and Adductor canal block   Has sensation at level L4   Patient not on BG protocol  Last pain score 0/10  Oxygen is room air   Medications administered None   Comorbidities include GERD, HTN   Xray complete in PACU   Bladder scanned for: 70cc at 1440               To Do List    Floor orders      Anticipatory Guidance / Discharge Planning    Pending

## 2021-07-18 NOTE — Anesthesia Procedure Notes (Signed)
---------------------------------------------------------------------------------------------------------------------------------------    AIRWAY   GENERAL INFORMATION AND STAFF    Patient location during procedure: OR       Date of Procedure: 07/18/2021 12:31 PM  CONDITION PRIOR TO MANIPULATION     Current Airway/Neck Condition:  Normal        For more airway physical exam details, see Anesthesia PreOp Evaluation  AIRWAY METHOD     Patient Position:  Sniffing    Preoxygenated: yes      Induction: IV    Mask Difficulty Assessment:  0 - not attempted    Number of Attempts at Approach:  1  FINAL AIRWAY DETAILS    Final Airway Type:  LMA    Final LMA: Unique    LMA Size: 4  ----------------------------------------------------------------------------------------------------------------------------------------

## 2021-07-18 NOTE — INTERIM OP NOTE (Signed)
1. Post-operative Diagnosis: R knee OA  2. Procedure performed: R TKA   3. Planned return to OR: none  4. Antibiotic regimen: Ancef x 24 hrs  5. Pain Management: MMA   6. DVT/HO prophylaxis and duration: ASA 81 BID   7. Weight bearing status and activity restrictions: WBAT   8. Pin/Wound Care: mepilex  9. Postoperative XRs and radiographic studies: AP knee in PACU  10. Diet: regular   11. Additional Specifics: none  12. Intra-op clinical photos: none  13. Anticipated outpatient clinic follow-up date: 2 weeks     Please page orthopedic surgery if questions arise regarding DVT ppx, WB status, antibiotics, or follow-up at the time of discharge.     Interim Op Note (Surgical Log ID: [id number])       Date of Surgery: 07/18/2021       Surgeons: Surgeon(s) and Role:     * Ginnetti, Gaynelle Arabian, MD - Primary     * Terrace Arabia, MD - Resident - Assisting   Assistants: RN First Assistant: Jason Fila, RN       Pre-op Diagnosis: Pre-Op Diagnosis Codes:     * Primary osteoarthritis of right knee [M17.11]       Post-op Diagnosis: Post-Op Diagnosis Codes:     * Primary osteoarthritis of right knee [M17.11]       Procedure(s) Performed: Procedure(s) (LRB):  RIGHT ARTHROPLASTY, KNEE, TOTAL (Right)       Anesthesia Type: General        Fluid Totals: I/O this shift:  04/13 0700 - 04/13 1459  In: 1000 (12.2 mL/kg) [I.V.:1000]  Out: 200 (2.4 mL/kg) [Blood:200]  Net: 800  Weight: 81.6 kg        Estimated Blood Loss: 200 mL       Specimens to Pathology:  * No specimens in log *       Temporary Implants:        Packing:                 Patient Condition: good       Findings (Including unexpected complications): As above, no complications, see op note      Signed:  Ashok Croon, MD  on 07/18/2021 at 2:20 PM

## 2021-07-18 NOTE — Plan of Care (Signed)
Problem: Impaired Bed Mobility  Goal: STG - IMPROVE BED MOBILITY  Note: Patient will perform bed mobility without rails and the head of bed flat with No assist (Independently)     Time frame: 1-3 Visits     Problem: Impaired Transfers  Goal: STG - IMPROVE TRANSFERS  Note: Patient will complete Stand pivot transfers using a rolling walker with Modified independence     Time frame: 1-3 Visits     Problem: Impaired Ambulation  Goal: STG - IMPROVE AMBULATION  Note: Patient will ambulate 100-149 feet using a rolling walker with Modified independence    Time frame: 1-3 Visits       Problem: Impaired Stair Navigation  Goal: STG - IMPROVE STAIR NAVIGATION  Note: Patient will navigate 12 steps with 1  rail(s) and least restrictive assistive device and Supervision of 1    Time frame: 1-3 Visits      Eino Farber PT, DPT  Web page or secure chat Eino Farber

## 2021-07-18 NOTE — Discharge Instructions (Addendum)
Surgical date: 07/18/2021 TOTAL KNEE ARTHROPLASTY    Helpful Telephone Numbers:  Nurse Navigator: [phone] Monday through Friday 8 am to 4:30 pm (for questions and concerns)  After Hours: [phone] (weekends and M-F after 4:30pm) (for questions and concerns that need to be addressed after office hours)  Appointment line: [phone] make, change, or schedule an appointment)  Surgeons office: Dr. Lucretia Kern - Office Phone [phone]    You or your caregiver should call your surgeon's nurse navigator's line at [phone] for:  Redness around the incisions   Continuous drainage or bleeding from incision sites (a small amount of drainage is expected initially)  Temperature above 101 F  Pain not controlled by prescribed pain medications   Any other worrisome condition    If you feel you are having a Medical Emergency please present to the [facility] Emergency Department    You or your caregiver should call your surgeons office for any prescription refill requests.    After hours and on weekends there is always someone on call, available through the answering service [phone] to address your concerns.  Please don't hesitate to reach out with your questions.   In the event that you are unable to contact the office or the answering service, please go to [facility] Emergency Department for evaluation for urgent concerns.    [employer] Questionnaire Reminder  Two and four days after surgery, you will receive questionnaires through [employer]. Please complete them both using the app or the link in that day's [employer] email. Your Nurse Navigators will be checking in with you as they were before surgery, and your responses will help them assess your progress    Thank you for choosing [facility] for your joint replacement.  Please adhere to the following instructions in order to maximize your recovery.    Please follow the instructions next to any check box that has  been checked i.e. [x]     Activity:  You should be weight bearing as tolerated on your operative extremity. Walk and stair-climb as tolerated.   Use assistive devices (walker, crutches, cane) as instructed by your Physical Therapist.   Do not lift, carry, push or pull objects heavier than 10 pounds until cleared by your surgeon.   Driving is permitted once you are functionally cleared to do so by your surgeon AND you are no longer taking narcotic pain medications.     Diet:  You may resume your previous diet, as tolerated.   Please aim for protein intake of approximately 100 grams per day to promote healing. (Meats, fish, peanut butter, and Ensure/Boost shakes are all good sources of protein.)  If you feel nauseous or develop vomiting, go back to just clear liquids, increasing to a soft diet and then regular solid foods as tolerated.  Smoking and/or nicotine use are associated with poor wound healing and increased risk of infection. Therefore it is recommended you not smoke or use nicotine for as long as possible following surgery.  If you are diabetic, it is important to keep your blood sugars well-controlled. Poorly controlled blood sugars are associated with poor wound healing and increased risk of infection.     Labs:  [x]    None required.  []    Your *** should be rechecked on ***. Results will be sent to your surgeon's office as well as your PCP.      Preventing Blood Clots (Deep Vein Thrombosis/Pulmonary Embolism or DVT/PE):  Please wear your anti-embolism stockings for 14  days. They should be worn during the day and removed at night - unless instructed otherwise.      You will be prescribed a blood thinner to help minimize the increased risk of blood clots following surgery. Please refer to your MEDICATION LIST for details. While taking blood thinners, you may find you bruise or develop nose bleeds more easily. You should watch for signs of gastrointestinal bleeding, which may include dark, tar-like stools or  vomit with the appearance of coffee-grounds. You should call your surgeon if you notice these signs.   You have been instructed to take Enteric-Coated Aspirin 81 mg tablet by mouth twice daily for 4 weeks for deep vein thrombosis prophylaxis (blood clot prevention).     [x]   Please do not continue your previous aspirin therapy until you have completed the full duration of DVT prevention medication. At that time you may return to your previous dose and frequency (number of times a day).       Stomach irritation prevention:        [x]   While you are on Aspirin therapy to prevent blood clots, you have been instructed to take pepcid (famotidine) twice a day or a PPI (protonix, prevacid etc) daily to help reduce gastric irritation and protect   your stomach lining as aspirin can be abrasive to the stomach               [x]   While you are on NSAID (mobic/celebrex) therapy for pain control, you have been instructed to take pepcid (famotidine) twice a day, or a PPI (protonix, prevacid etc) daily to help reduce gastric irritation and protect your stomach lining as these medications can be abrasive to the stomach     Managing your pain:  Please refer to your MEDICATION LIST for detailed instructions on your personalized pain protocol.  You should apply ice for 30 minutes every 1-2 hours and elevate your extremity above the level of your heart 3 times a day and during rest to help reduce swelling and pain.  Narcotic medications are often necessary for a short time after total joint surgery. While you are taking narcotics:  Do NOT drive or operate heavy machinery.  Do NOT drink alcohol.  Do NOT make any important personal, business, or economic decisions or sign legal documents.  Narcotics can cause side effects such as sedation, lethargy, body-wide itching, nausea, vomiting and constipation.  You should work to gradually wean off of narcotic pain medications as your pain improves, first by decreasing the dose, and then by  increasing the amount of time in between doses.    It is important to dispose of all narcotics when you are certain you no longer need them. The first choice for all drug disposal is an authorized medication disposal bin, which is located at the [facility] pharmacy, most retail pharmacies, and law enforcement stations. If you are unable to access one of these bins, you should flush unused narcotics down the toilet.  Please call your surgeon's office M-F before 4:30pm for prescription refill requests     Constipation:  After surgery you are at increased risk for constipation because of reduced movement and activity levels as well as opioids/narcotics.   To prevent/alleviate constipation, you should walk as much as you can tolerate and drink plenty of fluids. You should also use a stool softener such as Colace (docusate sodium) and a laxative such as Senokot (senna) unless you develop diarrhea or loose stool.  These two medications have  been prescribed to you.  See your MEDICATION LIST for details.    If you have not had a bowel movement by your 3rd day after surgery, please consider adding Miralax, or taking magnesium citrate to help facilitate a bowel movement. Utilize your pharmacist, or please call your PCP/surgeon's office, if you have any concerns about the addition of these medications to your current home regimen.      Caring for your incision:   Your incision is covered with a surgical dressing.  Leave this dressing in place as long as it remains adhered. Likely, it will remain adhered until the time of your follow up appointment. After two weeks your dressing may be removed. After two weeks if your incision is dry it may be left open to air and you may shower  If you have drainage from your incision: You may not shower. Call your surgeon's office.  If there is no drainage you may shower with intact dressing in place. If there's any sign that the dressing is peeling at any corner, or wrinkled, cover  with plastic wrap before showering. Do not soak your wound in a bath, whirlpool, or in any kind of standing water. If you have a prevena dressing in place you need to be seen in office for follow up.  Underneath the dressing you have surgical staples or sutures that may be removed by the visiting nurse as part of your home care. They should not be removed before 14 days after surgery.  If your bandage has standing fluid beneath it, is >80% soiled, or has become mostly unattached from the skin, remove and cover with a dry dressing and tape until follow-up. Do not shower and do not allow incision to get wet.        Follow-up:  Call your surgeon's office at [phone] to make a follow-up appointment.     Your surgeon requests that you do not receive the Flu or Covid vaccine until 4 weeks have passed since your surgical date

## 2021-07-18 NOTE — Progress Notes (Signed)
Physical Therapy    Initial Eval Completed.  Patient is a 61 y.o.male who presents POD#0 s/p R TKA 07/18/21, WBAT RLE.    Is another physical therapy session necessary prior to hospital discharge:  Yes    Discharge recommendation:  Anticipate return to prior living arrangement, Intermittent supervision/assist, Home PT  Equipment recommendations upon discharge: None  Mobility recommendations for nursing while in hospital: 1A with RW    Past Medical History:   Diagnosis Date   . Arthritis     elbows and shoulders    . Chest pain, unspecified    . Depression 05/23/2014   . Neuromuscular disorder     elbows, shoulders   . Osteoarthritis of both knees    . Unspecified essential hypertension 06/02/2013       Past Surgical History:   Procedure Laterality Date   . carpel tunnel surgery Left    . HAND SURGERY      trigger finger release    . PR ARTHRS KNE SURG W/MENISCECTOMY MED/LAT W/SHVG Right 07/16/2016    Procedure: KNEE ARTHROSCOPY WITH PARTIAL MEDIAL MENISCECTOMY;  Surgeon: Norina Buzzard, MD;  Location: [facility];  Service: Orthopedics   . PR COLONOSCOPY FLX DX W/COLLJ SPEC WHEN PFRMD N/A 04/30/2020    Procedure: COLONOSCOPY;  Surgeon: Quitman Livings, MD;  Location: [facility];  Service: GI       *Bold Indicates co-morbidities affecting treatment and recovery    Comorbidities affecting treatment/recovery in addition to those listed above:  none noted    Personal factors affecting treatment/recovery:   History of falls    Clinical presentation:   stable       Patient complexity:  low level as indicated by above personal factors, environmental factors and comorbidities in addition to their impairments found on physical exam.     PT Adult Assessment - 07/18/21 1611        Prior Living     Prior Living Situation Reported by patient     Lives With Spouse     Type of Home 2 Story Home     # Steps to Enter Home 1     # Rails to [employer] Home 0     # Of Steps In Home 12     # Rails in Home 1     Location of Bedrooms  2nd floor     Location of Bathrooms 2nd floor     Bathroom Accessibility Tub/Shower unit     Medical Equipment in Home Straight Cane;Rolling walker;Raised toilet seat     Additional Comments wife present during session        Prior Function Level    Prior Function Level Reported by patient     Transfers Independent     Audiological scientist None     Walking Independent     Walking assistive devices used None     Stair negotiation Independent     History of Falls Yes     Frequency of Falls approx 6x in past 6mos     Mechanism of Falls knee gives out during work        PT Tracking    PT TRACKING PT Assigned     Type of Session evaluation     SW Request? No        Treatment Day    Treatment Day (HH / URR) 1        Precautions/Observations    Precautions used Yes     Weight Bearing  Status Right Lower Extremity - weight bearing as tolerated     LDA Observation IV lines     Was patient wearing a mask? Yes     PPE worn by Leisure centre manager;Face Shield;Mask     Fall Precautions General falls precautions;White board updated with appropriate mobility status;Patient educated to use call light for nursing assist prior to getting up        Pain Assessment    *Is the patient currently in pain? Yes     Pain (Before,During, After) Therapy Before;During;After     0-10 Scale 3;5     Pain Location Knee     Pain Orientation Right     Pain Descriptors Aching     Pain Intervention(s) Cold applied;Repositioned;Refer to nursing for pain management        Vision     Current Vision Adequate for PT session        Cognition    Cognition Tested     Arousal/Alertness Appropriate responses to stimuli     Orientation Alert and oriented x4     Ability to Follow Instructions Follows all commands and directions without difficulty        UE Assessment    Assessment Focus Range of Motion;Strength        LUE (degrees)    Overall ROM ROM WFL        RUE (degrees)    Overall ROM ROM WFL        LUE Strength    Overall Strength WFL assessed within functional  activities        RUE Strength    Overall Strength WFL assessed within functional activities        LE Assessment    Assessment Focus Range of Motion;Strength        LLE (degrees)    Overall ROM ROM WFL        RLE (degrees)    Overall ROM ROM deficits     Additional Comments AROM knee flex/ext deficits        LLE Strength    Hip Flexion 4/5     Knee Flexion 4/5     Knee Extension 4/5     Ankle Dorsiflexion 4+/5     Ankle Plantar Flexion 5/5        RLE Strength    Hip Flexion 3/5     Knee Flexion 3-/5     Knee Extension 3-/5     Ankle Dorsiflexion 4+/5     Ankle Plantar Flexion 5/5        Sensation    Sensation No apparent deficit        Bed Mobility    Bed mobility Tested     Supine to Sit Supervision;1 person assist;Side rails down;Head of bed flat     Sit to Supine Not tested     Additional comments slight increased effort noted        Transfers    Transfers Tested     Stand Pivot Transfers Contact guard;1 person     Sit to Stand Contact guard;1 person assist;Verbal cues     Stand to sit Contact guard;1 person assist;Verbal cues     Transfer Assistive Device rolling walker     Additional comments instructed on transfers from bed to recliner, verbal cues for hand placement with good follow through. Slight increased effort noted        Mobility    Mobility Tested     Weight Bearing Status Right Lower  Extremity - weight bearing as tolerated     Gait Pattern 3 point;Antalgic right side;Decreased cadence;Decreased R step length;Decreased R step height;Decreased L step length;Decreased L step height;Decreased stance time R     Ambulation Assist Contact guard;1 person assist     Ambulation Distance (Feet) 150     Ambulation Assistive Device rolling walker     Additional comments instructed on amb in room and hallway, no loss of balance or unsteadiness noted. Attempted to progress to a 2 point gait pattern but increased pain noted and changed back to a 3 point gait pattern        Family/Caregiver Training`     Patient/Family/Caregiver training Yes     Family/caregiver training Role of physical therapy in hospital and plan for evaluation and follow up;Discharge planning;Post-operative precautions;Use of assistive device     Patient training Role of physical therapy in hospital and plan for evaluation and follow up;Discharge planning;Post-operative precautions;Use of assistive device;Benefits of oob activity and mobility during hospitalization, including frequency of ambulation and change of position;Call don't fall, purpose of bed/chair alarm, and recommendation for nursing assistance during all oob mobility        Therapeutic Exercises    Exercises Performed LE     Ankle Pumps Bilaterally     Ankle Pumps-Assist Active     Ankle Pumps-Reps 3     Additional comments recommend 10 reps every hour that he's awake to decrease risk for edema and blood clots        Balance    Balance Tested     Sitting - Static Independent;Unsupported     Sitting - Dynamic Independent;Supported     Standing - Static Standby assist;Supported        Functional Outcome Measures    Functional Outcome Measures Yes        PT AM-PAC Mobility    Turning over in bed? 3     Sitting down on and standing up from a chair with arms? 3     Moving from lying on back to sitting on the side of the bed? 3     Moving to and from a bed to a chair? 3     Need to walk in hospital room? 3     Climbing 3 - 5 steps with a railing? 2     Total Raw Score 17     Standardized Score - Calculated 42.13     % Functional Impairment - Calculated 51%        Gait Speed    Assistive Device rolling walker     Distance Walked (meters) 3.05 m     Time it took to walk distance (seconds) 17 sec     Gait speed (m/sec) 0.18 m/sec        Assessment    Brief Assessment Appropriate for skilled therapy     Problem List Impaired LE ROM;Impaired LE strength;Impaired balance;Impaired transfers;Impaired ambulation;Impaired bed mobility;Impaired stair navigation;Impaired functional mobility;Pain  contributing to impairment     Patient / Family Goal return home     Overall Assessment Patient is POD#0 s/p R TKA 07/18/21, WBAT RLE. Patient presents with R knee pain, decreased R knee ROM and strength which results in a decline in all mobility        Plan/Recommendation    PT Treatment Interventions Restorative PT;AROM;Bed mobility training;Transfers training;Balance training;Stair training;Pt/Family education;Gait training;Home exercise program instruction;D/C planning;Will work to minimize pain while promoting mobility whenever possible     PT Frequency  5-7x/wk;BID Visit     PT Mobility Recommendations 1A with RW     PT Referral Recommendations OT;SW;Home care     PT Discharge Recommendations Anticipate return to prior living arrangement;Intermittent supervision/assist;Home PT     PT Discharge Equipment Recommended None     PT Assessment/Recommendations Reviewed With: Patient;Nursing;Family     Next PT Visit progress per protocol     PT needs to see patient prior to DC  Yes        Time Calculation    PT Timed Codes 0     PT Untimed Codes 17     PT Unbilled Time 0     PT Total Treatment 17        Plan and Onset date    Plan of Care Date 07/18/21     Onset Date 07/18/21     Treatment Start Date 07/18/21               AMPAC Score Interpretation:  Adapted from Roxanne Gates, et al Association of AM-PAC "6 Clicks" Basic Mobility and Daily Activity Scores with discharge destination, 2021  AM-PAC Raw Score of > 16 (standardized score > 40.78, % impairment <  54%) indicates a high likelihood of discharge to home     Adapted from Loma Sender al Prediction of disposition within 48-hours of hospital admission using patient mobility scores, 2019  AM-PAC raw score  ? 15 (standardized score ? 39, % impairment  < 58%) indicates a high probability of a discharge to home  AM-PAC raw score 9-14 (standardized score ? 31, % impairment 81%-61%) and age < 28 is more likely to discharge to home  AM-PAC raw score 9-14 (standardized score  ? 31, % impairment 81%-61%) and age > 39 is more likely to require post-acute care  AM-PAC raw score < 9 (standardized score < 31, % impairment > 81%) indicates pt is more likely to require post-acute care      Gait Speed Score Interpretation:  Leodis Liverpool, Kennyth Arnold PT, PhD; Rexanne Mano PT, PhD. "Walking Speed: the Sixth Vital Sign." Journal of Geriatric Physical Therapy 32. 2 (2009) : 2-9.)   0 m/s -0.4 m/s: Household walker; Needs intervention to reduce fall risk; Dependent with ADL's and IADL's   0.4 m/s -0.6 m/s: [employer] information centre manager; Needs intervention to reduce fall risk   0.6 m/s -1.0 m/s: [employer]; Needs intervention to reduce fall risk   m/s- 1.4 m/s: Normal Walking speed, Cross the [employer], Less likely to have adverse events.       Clyda Hurdle PT, DPT  Web page or secure chat Clyda Hurdle

## 2021-07-18 NOTE — Anesthesia Case Conclusion (Signed)
CASE CONCLUSION  Emergence  Actions:  LMA removed, OPA and mask supported  Criteria Used for Airway Removal:  Adequate Tv & RR and acceptable O2 saturation  Assessment:  Routine  Transport  Directly to: PACU  Airway:  Facemask and OPA  Oxygen Delivery:  6 lpm  Position:  Recumbent  Patient Condition on Handoff  Level of Consciousness:  Moderately sedated  Patient Condition:  Stable  Handoff Report to:  RN

## 2021-07-18 NOTE — Invasive Procedure Plan of Care (Signed)
CONSENT FOR MEDICAL  OR SURGICAL PROCEDURE                            Patient Name: Lance Peters  [facility] [address] [facility]                                                              DOB: 07-03-1960        . Please read this form or have someone read it to you.  . It's important to understand all parts of this form. If something isn't clear, ask [place] to explain.  . When you sign it, that means you understand the form and give [place] permission to do this surgery or procedure.    . I agree for Laurance Flatten, MD , and Resident Physicians, Physician Assistants, Nurse First Assistants  along with any assistants* they may choose, to treat the following condition(s): Right Knee Arthritis  . By doing this surgery or procedure on me: Right Total Knee Arthroplasty   . This is also known as: Right Total Knee Arthroplasty (TKA)  . Laterality: Right     *if you'd like a list of the assistants, please ask. We can give that to you.    1. The care provider has explained my condition to me. They have told me how the procedure can help me. They have told me about other ways of treating my condition. I understand the care provider cannot guarantee the result of the procedure. If I don't have this procedure, my other choices are: No surgery, conservative care including rest, ice, injections, medication, bracing, cane, walker    2. The care provider has told me the risks (problems that can happen) of the procedure. I understand there may be unwanted results. The risks that are related to this procedure include: Infection requiring further surgery, bleeding, nerve injury causing foot drop, blood vessel injury requiring further surgery for limb salvage, tendon injury, ligament injury, bone fracture, implant failure, implant loosening, knee stiffness, medical complications, blood clots in the legs and/or lungs, need for further surgery, death, and unforseen complications.    3. I understand that during the procedure, my care provider may find a condition  that we didn't know about before the treatment started. Therefore, I agree that my care provider can perform any other treatment which they think is necessary and available.    4. I give permission to the hospital and/or its departments to examine and keep tissue, blood, body parts, fluids or materials removed from my body during the procedure(s) to aid in diagnosis and treatment, after which they may be used for scientific research or teaching by appropriate persons. If these materials are used for science or teaching, my identity will be protected. I will no longer own or have any rights to these materials regardless of how they may be used.    5. My care provider might want a representative from a medical device company to be there during my procedure. I understand that person works for:  [employer]        The ways they might help my care provider during my procedure include:        Helping the operating room staff prepare the special device my  care provider wants to use and Providing information and support to operating room staff regarding the device    6. Here are my decisions about receiving blood, blood products, or tissues. I understand my decisions cover the time before, during and after my procedure, my treatment, and my time in the hospital. After my procedure, if my condition changes a lot, my care provider will talk with me again about receiving blood or blood products. At that time, my care provider might need me to review and sign another consent form, about getting or refusing blood.    I understand that the blood is from the community blood supply. Volunteers donated the blood, the volunteers were screened for health problems. The blood was examined with very sensitive and accurate tests to look for hepatitis, HIV/AIDS, and other diseases. Before I receive blood, it is tested again to make sure it is the correct type.    My chances of getting a sickness from blood products are small. But no  transfusion is 100% safe. I understand that my care provider feels the good I will receive from the blood is greater than the chances of something going wrong. My care provider has answered my questions about blood products.      My decision  about blood or  blood products   Yes, I agree to receive blood or blood products if my care provider thinks they're needed.        My decision   about tissue  Implants     Yes, I agree to receive tissue implants if my care provider thinks they're needed.          I understand this  form.    My care provider  or his/her  assistants have  explained:   What I am having done and why I need it.  What other choices I can make instead of having this done.  The benefits and possible risks (problems) to me of having this done.  The benefits and possible risks (problems) to me of receiving transplants, blood, or blood products.  There is no guarantee of the results.  The care provider may not stay with me the entire time that I am in the operating or procedure room.  My provider has explained how this may affect my procedure. My provider has answered my  questions about this.         I give my  permission for  this surgery or  procedure.            _______________________________________________                                     My signature  (or parent or other person authorized to sign for you, if you are unable to sign for  yourself or if you are under 28 years old)        ______           Date        _____        Time   Electronic Signatures will display at the bottom of the consent form.    Care provider's statement: I have discussed the planned procedure, including the possibility for transfusion of blood  products or receipt of tissue as necessary; expected benefits; the possible complications and risks; and possible alternatives  and their benefits and risks with the patients or the  patient's surrogate. In my opinion, the patient or the patient's surrogate  understands the  proposed procedure, its risks, benefits and alternatives.              Electronically signed by: Laurance Flatten, MD                                                07/18/2021         Date        11:02 AM        Time   Your doctor or someone your doctor has appointed has told you that you may need blood or a blood product transfusion, which has been collected from volunteers, as part of your treatment as a patient.    The reasons you might need blood or blood products include, but are not limited to:    Significant loss of your own blood   Your body may not be getting enough oxygen to its tissues    Treatment of bleeding disorders caused by low platelet counts or platelets that do not work right (platelets are part of a cell that helps to form clots and keeps you from bleeding too much).   You may not have enough of other substances that help your blood clot or stop you from bleeding more  The risks of getting a transfusion of blood or blood products include, but are not limited to:    Damage to the lungs   Difficulty breathing due to fluid in the lungs   The product may contain bacteria or rarely a virus (which includes HIV and Hepatitis)   Blood from the community blood supply has been collected from volunteer donors who have been screened for health risk. The blood has been tested for major blood transmitted disease, but no transfusion is 100% safe. The blood is tested with very sensitive and accurate tests to screen for hepatitis, AIDS, and other disease, which makes the risks very small.   You may have side effects from the transfusion (rash, fever, chills) or an allergic reaction   The transfusion increases your risks of getting infection or cancer coming back   The transfusion can increase the time you have to stay in the hospital   The transfusion can potentially cause death if the wrong blood is given or your body rejects the blood   Before blood is transfused, it is tested again to make sure it  is the correct type  There are other options than getting blood or blood products from other people and they include:    Drugs which can decrease bleeding   Drugs which can cause your body to make more blood (used in elective procedures with advance notice)   Autologous (your own blood) donation (needs pre-arrangement)   No transfusion  If you exercise your right to refuse to be transfused with blood or blood products; these things listed below, among others, could happen to you:   Your body may not get enough oxygen and suffer damage   You may have a higher chance of bleeding   You may limit other options for your condition   You may die from losing too much blood

## 2021-07-18 NOTE — Progress Notes (Signed)
[facility] [place]  ORTHOPAEDIC SURGERY POST-OP CHECK         Subjective:  POD 0. Patient see in PACU. VSS. Patient recovering as expected. Pain controlled. Denies numbness, tingling, nausea, or vomiting.     Objective  Last Filed Vitals    07/18/21 1410   BP: 120/77   Pulse: 69   Resp: 11   Temp: 36.1 C (97 F)   SpO2: 98%     General: NAD, Pleasant, Appropriate, AOx3   Respiratory: Breathing comfortably on room air, No evidence of respiratory distress  Cardiac: RRR  Abdominal: ND/NT      RLE:   mepilex clean, dry, intact.   Motor intact ankle dorsiflexion/plantarflexion, toe flexion/extension.   Sensation intact to light touch 1st dorsal web space/medial/lateral/plantar/dorsal foot. 2+ dorsalis pedis/posterior tibialis pulse, toes warm and well-perfused, < 3 second capillary refill.    Assessment/Plan: Lance Peters is a 61 y.o. male with history of R knee OA, admitted on 07/18/2021, who is now s/p R TKA on 07/18/21      1. Recovering as expected, admit for observation  2. Pain control  3. Bowel regimen   4. ADAT  5. Weight-bearing status: WBAT RLE. Ice/elevate.   6. PT/OT & OOB    7. DVT ppx ASA 81 BID  8. Abx ancef x24hrs  9. Dispo: Pending clinical course, pain control, PT clearance.       Ashok Croon, MD  Orthopaedic Surgery  07/18/2021 2:25 PM

## 2021-07-19 ENCOUNTER — Other Ambulatory Visit: Payer: Self-pay

## 2021-07-19 ENCOUNTER — Encounter: Payer: Self-pay | Admitting: Orthopedic Surgery

## 2021-07-19 ENCOUNTER — Other Ambulatory Visit: Payer: Self-pay | Admitting: Primary Care

## 2021-07-19 DIAGNOSIS — G8918 Other acute postprocedural pain: Secondary | ICD-10-CM

## 2021-07-19 MED ORDER — MELOXICAM 15 MG PO TABS *I*
15.0000 mg | ORAL_TABLET | Freq: Every day | ORAL | 0 refills | Status: AC
Start: 2021-07-19 — End: 2021-08-02
  Filled 2021-07-19: qty 14, 14d supply, fill #0

## 2021-07-19 MED ORDER — BENAZEPRIL HCL 10 MG PO TABS *I*
10.0000 mg | ORAL_TABLET | Freq: Every day | ORAL | 5 refills | Status: DC
Start: 2021-07-22 — End: 2022-09-02

## 2021-07-19 MED ORDER — OXYCODONE HCL 5 MG PO TABS *I*
5.0000 mg | ORAL_TABLET | ORAL | 0 refills | Status: DC | PRN
Start: 2021-07-19 — End: 2021-08-06
  Filled 2021-07-19: qty 60, 10d supply, fill #0

## 2021-07-19 MED ORDER — MORPHINE SULFATE ER 15 MG PO TBCR *I*
15.0000 mg | ORAL_TABLET | Freq: Two times a day (BID) | ORAL | 0 refills | Status: AC
Start: 2021-07-19 — End: 2021-07-26
  Filled 2021-07-19: qty 14, 7d supply, fill #0

## 2021-07-19 MED ORDER — ACETAMINOPHEN 500 MG PO TABS *I*
1000.0000 mg | ORAL_TABLET | Freq: Three times a day (TID) | ORAL | 0 refills | Status: DC
Start: 2021-07-19 — End: 2022-01-07
  Filled 2021-07-19: qty 100, 16d supply, fill #0

## 2021-07-19 MED ORDER — AMLODIPINE BESYLATE 10 MG PO TABS *I*
10.0000 mg | ORAL_TABLET | Freq: Every day | ORAL | Status: DC
Start: 2021-07-20 — End: 2021-11-11

## 2021-07-19 MED ORDER — GABAPENTIN 300 MG PO CAPSULE *I*
300.0000 mg | ORAL_CAPSULE | Freq: Every evening | ORAL | 0 refills | Status: AC
Start: 2021-07-19 — End: 2021-08-02
  Filled 2021-07-19: qty 14, 14d supply, fill #0

## 2021-07-19 NOTE — Progress Notes (Signed)
Anesthesiology Nerve Block Progress Note    LOS: [id number]       Cartrell tolerated surgery very well and did not need any pain or nausea medications in PACU. Overnight there were no acute events and his pain was well-controlled. Currently there is minimal pain in the right knee. He has been ambulating without issue. No nausea. The block has worn off without deficit.    Most Recent Vitals:  Last Filed Vitals    07/19/21 0759   BP: 109/63   Pulse: 62   Resp: 18   Temp: 36.3 C (97.3 F)   SpO2: 95%     Last Nursing documented pain:  0-10 Scale: 2 (07/19/21 0905 : Mayford Knife, RN)     Mental Status: awake, alert and appropriate  Light touch sensation intact and equal in BLLE  5/5 motor strength in BLLE  Standing without issue    Scheduled Medications:   . atorvastatin  20 mg Oral Daily with dinner   . DULoxetine  30 mg Oral Daily   . sertraline  50 mg Oral Daily   . docusate sodium  200 mg Oral Daily   . senna  2 tablet Oral Nightly   . polyethylene glycol  17 g Oral Daily   . acetaminophen  1,000 mg Oral 3 times per day   . morphine  15 mg Oral 2 times per day   . meloxicam  15 mg Oral Daily   . gabapentin  300 mg Oral Nightly   . aspirin EC  81 mg Oral BID   . famotidine  20 mg Oral 2 times per day   . lubiprostone  24 mcg Oral BID WC     PRN Medications:  . sodium chloride  0-500 mL/hr Intravenous PRN   . dextrose  0-500 mL/hr Intravenous PRN   . sodium chloride  0-500 mL/hr Intravenous PRN   . dextrose  0-500 mL/hr Intravenous PRN   . magnesium hydroxide  30 mL Oral Daily PRN   . ondansetron  4 mg Intravenous Q6H PRN   . oxyCODONE  5 mg Oral Q4H PRN    Or   . oxyCODONE  10 mg Oral Q4H PRN   . calcium carbonate  1,000 mg Oral TID PRN   . camphor-menthol   Topical PRN   . haloperidol lactate  0.5 mg Intravenous Q6H PRN     Lab Results:   Most recent platelet count:       Lab results: 07/08/21  1354   Platelets 217     Most recent creatinine:       Lab results: 07/18/21  1709   Creatinine 0.91     Most recent PT/INR:        Lab results: 07/08/21  1354   Protime 12.5   INR 1.1     Assessment/Plan:   Patient is postoperative day # 1 after RIGHT ARTHROPLASTY, KNEE, TOTAL, with adductor canal nerve block for pain control. His pain was well-controlled overnight and the block has worn off without deficit this morning. He has transitioned successfully to PO pain medications. No need for reblock at this time.    Tylenol     1 gm q 8 hrs  continue    NSAIDS     meloxicam:   continue    Opioids    For breakthrough: oxycodone IR q 4hrs prn continue             For long-acting (bid dosing):  MS contin  continue      Johnnye Lana, MD  (pager [phone])  Anesthesiology  07/19/2021

## 2021-07-19 NOTE — Progress Notes (Addendum)
[facility] [place]  ORTHOPAEDIC SURGERY PROGRESS NOTE         Patient:Lance Peters  MRN: [id number]  DOA: 07/18/2021    Subjective:  NAE overnight. VSS. Pain well controlled. Tolerating PO. Voiding spontaneously. -BM. +flatus. Denies fever/chills, shortness of breath/chest pain, nausea/vomiting, new numbness or tingling. Working with PT.     Medications    Scheduled:   ? atorvastatin  20 mg Oral Daily with dinner   ? DULoxetine  30 mg Oral Daily   ? sertraline  50 mg Oral Daily   ? docusate sodium  200 mg Oral Daily   ? senna  2 tablet Oral Nightly   ? polyethylene glycol  17 g Oral Daily   ? acetaminophen  1,000 mg Oral 3 times per day   ? morphine  15 mg Oral 2 times per day   ? meloxicam  15 mg Oral Daily   ? gabapentin  300 mg Oral Nightly   ? aspirin EC  81 mg Oral BID   ? famotidine  20 mg Oral 2 times per day   ? lubiprostone  24 mcg Oral BID WC       Continuous Infusions:   ? sodium chloride Stopped (07/18/21 1936)       PRN: sodium chloride, dextrose, sodium chloride, dextrose, magnesium hydroxide, ondansetron, oxyCODONE **OR** oxyCODONE, calcium carbonate, camphor-menthol, haloperidol lactate    Vital Signs    Current Vitals Vitals Range (24 hours)   BP 121/74 (BP Location: Left arm)   Pulse 78   Temp 36.4 ?C (97.5 ?F) (Temporal)   Resp 18   Ht 1.778 m (5\' 10" )   Wt 81.6 kg (180 lb)   SpO2 94%   BMI 25.83 kg/m?  BP: (118-136)/(74-96)   Temp:  [36 ?C (96.8 ?F)-36.4 ?C (97.5 ?F)]   Temp src: Temporal (04/13 2153)  Heart Rate:  [68-79]   Resp:  [11-24]   SpO2:  [94 %-100 %]   Height:  [177.8 cm (5\' 10" )]   Weight:  [81.6 kg (180 lb)]      Intake/Output:  Date 07/18/21 0700 - 07/19/21 0659 07/19/21 0700 - 07/20/21 0659   Shift 0700-1459 1500-2259 2300-0659 24 Hour Total 0700-1459 1500-2259 2300-0659 24 Hour Total   INTAKE   P.O.  1580 480 2060         P.O.  1580 480 2060       I.V.(mL/kg/hr) 1200(1.8) 10(0) 608.7 1818.7         Saline Flush (ml)  10  10         Volume (mL) (Lactated Ringers  Infusion) 100   100         Volume (mL) (sodium chloride 0.9 % IV)   608.7 608.7         Volume (mL) (Lactated Ringers Infusion) 1100   1100       Shift Total(mL/kg) 1200(14.7) 5409(81.1) 1088.7(13.3) [id number](47.5)       OUTPUT   Urine(mL/kg/hr)  1050(1.6) 425 1475         Urine  1050 425 1475         Urine Occurrence 1 x   1 x         Pre-Void Bladder Scan Reading 70 ml   70 ml       Stool             Stool Occurrence  0 x  0 x       Blood 200   200  Blood Loss 200   200       Shift Total(mL/kg) 200(2.4) 1050(12.9) 425(5.2) 1675(20.5)       NET 1000 540 663.7 2203.7       Weight (kg) 81.6 81.6 81.6 81.6 81.6 81.6 81.6 81.6       Drain Output:     Physical Exam:  General: Alert, no acute distress, supine in bed.       RLE:   mepilex clean, dry, intact.   Motor intact ankle dorsiflexion/plantarflexion, toe flexion/extension.   Sensation intact to light touch 1st dorsal web space/medial/lateral/plantar/dorsal foot. 2+ dorsalis pedis/posterior tibialis pulse, toes warm and well-perfused, < 3 second capillary refill.    Laboratory Studies    Recent Labs   Lab 07/18/21  1709   Sodium 137   Potassium 4.0   Chloride 103   CO2 21   UN 15   Creatinine 0.91   Glucose 205*   Calcium 8.7    Recent Labs   Lab 07/18/21  1709   Hemoglobin 13.9   Hematocrit 45    No results for input(s): TB, DB, AST, ALT, ALK, TP, ALB, PALB, AMY, LIP in the last 168 hours.        Laboratory studies:  Recent Labs   Lab 07/18/21  1709   Hemoglobin 13.9   Hematocrit 45     Recent Labs   Lab 07/18/21  1709   Sodium 137   Potassium 4.0   Chloride 103   CO2 21     No components found with this basename: BUN, LABGLOM, CALCIUM    No results for input(s): APTT, INR, PTT in the last 168 hours.    Microbiology:      Imaging:  reviewed    Assessment/Plan: Lance Peters is a 61 y.o. male with history of R knee OA, admitted on 07/18/2021, who is now s/p R TKA   1. RTOR none  2. Pain control: ATC tylenol,  [id number], Dilaudid  3. Bowel regimen   4. Acute  blood loss anemia: 45 (ctm)  5. Diet regular  6. Abx peri op   7. Xrays reviewed  8. Weight-bearing status: WB AT RLE, ice/elevate   9. PT/OT & OOB    10. DVT ppx ASA 81 BID    11. Dispo: pending PT     Joylene Draft, MD  Orthopaedic Surgery Resident  07/19/2021 6:20 AM     Patient seen and evaluated this AM, resident note reviewed, and I agree with the above findings & treatment plan.     Laurance Flatten, MD 07/20/2021 1:31 PM

## 2021-07-19 NOTE — Anesthesia Postprocedure Evaluation (Signed)
Anesthesia Post-Op Note    Patient: Lance Peters    Procedure(s) Performed:  Procedure Summary  Date:  07/18/2021 Anesthesia Start: 07/18/2021 12:02 PM Anesthesia Stop: 07/18/2021  2:12 PM Room / Location:  [id number]_OR_16 / HH MAIN OR   Procedure(s):  RIGHT ARTHROPLASTY, KNEE, TOTAL Diagnosis:  Primary osteoarthritis of right knee [M17.11] Surgeon(s):  Laurance Flatten, MD  Luisa Dago, MD Responsible Anesthesia Provider:  Campbell Riches, MD         Recovery Vitals  BP: 109/63 (07/19/2021  7:59 AM)  Heart Rate: 62 (07/19/2021  7:59 AM)  Heart Rate (via Pulse Ox): 62 (07/18/2021  3:12 PM)  Resp: 18 (07/19/2021  7:59 AM)  Temp: 36.3 ?C (97.3 ?F) (07/19/2021  7:59 AM)  SpO2: 95 % (07/19/2021  7:59 AM)  O2 Flow Rate: 6 L/min (07/18/2021  2:15 PM)   0-10 Scale: 5 (07/19/2021 12:04 PM)    Anesthesia type:  general  Complications Noted During Procedure or in PACU:  None   Comment:    Patient Location:  [facility] Floor  Level of Consciousness:    Recovered to baseline  Patient Participation:     Able to participate  Temperature Status:    Normothermic  Oxygen Saturation:    Within patient's normal range  Cardiac Status:   Within patient's normal range  Fluid Status:    Stable  Airway Patency:     Yes  Pulmonary Status:    Baseline  Neuraxial Block Evaluation:    No residual motor or sensory symptoms  Pain Management:    Adequate analgesia  Nausea and Vomiting:  None    Post Op Assessment:    Tolerated procedure well and no evidence of recall  Responsible Anesthesia Provider Attestation:  All indicated post anesthesia care provided        Follow up for Adductor Canal () block Laterality: right  As of  Per patient  the block is completely resolved.  Follow Up:  Complete        Complications Noted During Recovery Period:      None  Recovery from Anesthesia:      Recovered to baseline level of consciousness, No residual motor or sensory symptoms following neuraxial anesthesia and No residual motor or sensory symptoms following regional  anesthesia  Condition of patient:      Recovered to pre-anesthetic condition

## 2021-07-19 NOTE — Plan of Care (Signed)
Patient has cleared PT, OT, is voiding spontaneously, and has had x-ray completed.  Patient has achieved adequate pain control and is amenable to discharge.  Discharge instructions reviewed with patient and patient stated understanding.  IV removed.  Patient discharged to home as per order.  Guiseppe Flanagan, RN

## 2021-07-19 NOTE — Discharge Summary (Signed)
Name: Lance Peters MRN: [id number] DOB: 18-Feb-1961     Admit Date: 07/18/2021   Date of Discharge: 07/19/2021     Patient was accepted for discharge to   [facility]           Discharge Attending Physician: Laurance Flatten, MD      Hospitalization Summary    CONCISE NARRATIVE: Lance Peters was admitted for elective arthroplasty. Their post operative course was without significant events. Patient was discharged to home.         OR PROCEDURE: Right Total Knee Arthroplasty 07/18/2021               SIGNIFICANT MED CHANGES: Yes  Chemical DVT prophylaxis was initiated. Detailed in [facility]. Analgesics tailored to tolerance and adequate pain management.         Signed: Radene Gunning, PA  On: 07/19/2021  at: 11:15 AM

## 2021-07-19 NOTE — Progress Notes (Signed)
Occupational Therapy Evaluation    Initial Eval Completed.  Patient is a 61 y.o.male who presents POD #1 R TKA.  Pt demonstrates safe and adequate independence with ADL's to return home with intermittent supervision/assist from family as needed.      Does OT need to see patient PRIOR to D/C: No  Discharge recommendation:  Prior Living Environment, No further OT   Equipment recommendations:     Hospital Stay Recommendations for nursing: N/A    Past Medical History:   Diagnosis Date   . Arthritis     elbows and shoulders    . Chest pain, unspecified    . Depression 05/23/2014   . Neuromuscular disorder     elbows, shoulders   . Osteoarthritis of both knees    . Unspecified essential hypertension 06/02/2013       Past Surgical History:   Procedure Laterality Date   . carpel tunnel surgery Left    . HAND SURGERY      trigger finger release    . PR ARTHRS KNE SURG W/MENISCECTOMY MED/LAT W/SHVG Right 07/16/2016    Procedure: KNEE ARTHROSCOPY WITH PARTIAL MEDIAL MENISCECTOMY;  Surgeon: Norina Buzzard, MD;  Location: [facility];  Service: Orthopedics   . PR COLONOSCOPY FLX DX W/COLLJ SPEC WHEN PFRMD N/A 04/30/2020    Procedure: COLONOSCOPY;  Surgeon: Quitman Livings, MD;  Location: [facility];  Service: GI       *Bold Indicates co-morbidities affecting treatment and recovery    Occupational profile relating to the present problem:   none identified    In addition to the PMH and surgical history listed above, the comorbidities affecting treatment/recovery:   none noted      Performance deficits that result in activity limitations and/or performance restrictions:  N/A    Modification of tasks needed or assistance with assessments necessary to enable completion of evaluation component: N/A    Patient complexity:  low level as indicated by  personal factors, environmental factors and comorbidities in addition to their impairments found on physical exam.     OT Assessment (HH / URR) - 07/19/21 1052        OT Tracking   (HH / URR)    OT Tracking (HH / URR) OT Discontinue     Type of Session evaluation     SW Request? No        OT Last Visit    Visit (#)  1        Precautions    Precautions used Yes     Weight Bearing Status Right Lower Extremity - Weight bearing as tolerated     Fall Precautions General falls precautions     Patient Wearing Mask No     Writer wearing PPE including Gloves;Mask     Activity Order Activity as tolerated        Home Living (Prior to Admission)    Prior Living Situation Reported by patient;Obtained via chart review     Type of Home 2 Story home     Location of Bedroom Second floor     Location of Bathroom Second floor     # Steps to Enter Home 1     # Of Steps In Home 12     Bathroom Shower/Tub Tub/Shower unit     Bathroom Equipment Raised toilet seat with arms     Medical Equipment in Home Cane;Rolling Therapist, sports Accessible        Prior Function  Prior Function Reported by patient;Obtained during chart review     Level of Independence Independent with ADLs;Independent with ADL functional transfers;Independent ambulation;Independent with homemaking with ambulation;Driving;Working     Lives With [employer] Help From Independent     IADL Independent     Vocational Full time employment        Pain Assessment    *Is the patient currently in pain? Yes     Pain Location 1 Knee     Orientation of Pain 1 Right     Pain Scale 1 8     Pain 1 Before     Pain Intervention(s) 1 Repositioned     Additional Comments Pt due for pain medication at 12pm        Vision     Current Vision No visual deficits;Wears corrective lenses        Cognition    Cognition No deficit noted     Level of Alertness Appropriate responses to stimuli     Orientation Alert and oriented x4     Attention  Appears intact     Memory Appears intact     Following Commands Follows one step commands 100% of the time        UE Assessment    UE Assessment Full AROM RUE;Full AROM LUE     Additional Comments strength in  BUEs WFL        Bed Mobility    Additional Comments Pt greeted up in the recliner chair and left in the recliner at end of session        Functional Transfers    Sit to Stand Modified Independent;Rolling Walker     Stand to Sit Modified Independent;Rolling Walker     Toilet Transfers Modified Independent;Rolling Walker     Functional Mobility Modified Independent;Rolling Educational psychologist    Sitting - Static Independent      Sitting - Dynamic Independent     Standing - Static Independent     Standing - Dynamic Independent        ADL Assessment    Grooming Independent     LE Dressing Set up;Modified independent     Where  LE Dressing Assessed Chair;Standing     Assist Needed With: Socks;Shoes;Pants over feet;Pants up to thighs;Pants up to waist   Donning TED stockings    Toileting Modified independence     Where Toileting Assessed --   standing at toilet    Additional Comments Pt educated on TED completion and wearing schedule/ cleaning. Pt verbalized understanding.        Activity Tolerance    Endurance Tolerates 10 - 20 min activity with multiple rests        Functional Outcomes Measures    Functional Outcome Measures Yes        OT AM-PAC Self Care    Putting on and taking off regular lower body clothing? 4     Bathing (including washing, rinsing, drying)? 4     Toileting, which includes using toilet, bedpan, or urinal? 4     Putting on and taking off regular upper body clothing? 4     Taking care of personal grooming such as brushing teeth? 4     Eating Meals 4     Total Raw Score 24     CMS Score - Calculated 0.00%        OT Treatment Assessment    Assessment D/C  acute OT        Plan    OT Frequency One-time visit     No acute OT needs Pt demonstrates adequate ADL skills for return to prior living environment;Pt demonstrates adequate cognitive skills for return to prior living environment        Recommendation    OT Discharge Recommendations Prior Living Environment;No further OT     OT Hospital Stay  Recommendations N/A     OT Next Visit N/A     OT needs to see patient prior to DC  No        Multidisciplinary Communication    Multidisciplinary Communication RN, PT, spouse        Patient/Family/Caregiver Training    Patient/Family/Caregiver training Yes     Patient/Family/Caregiver training Role of occupational therapy in hospital and plan for evaluation;Discharge planning;OT plan of care after evaluation        Time Calculation    OT Untimed Codes 18     OT Unbilled Time 0     OT Total Treatment 18        Plan and Onset date    Plan of Care Date 07/19/21     Onset Date 07/18/21     Treatment Start Date 07/19/21                 AMPAC Score Interpretation:  Adapted from Roxanne Gates, et al Association of AM-PAC "6 Clicks" Basic Mobility and Daily Activity Scores with discharge destination, 2021  AM-PAC Raw Score of > 19 (standardized score > 40.22 %) indicates a high likelihood of discharge to home     Adapted from [employer] al "Validity of h AM-PAC "6- Clicks" Inpatient Daily Activity and Basic Mobility Short Forms (2014)   AM-PAC raw score  ? 18 (standardized score ? 39, % impairment  < 47%) indicates a high probability of a discharge to home  AM-PAC raw score  ? 13 (standardized score ? 32.03, % impairment 63%) indicates pt is more likely to require IRF/SNF R  AM-PAC raw score <12 (standardized score < 30.6 % impairment > 66%) indicates pt is more likely to require Long Term Care     Elwin Mocha MS OTR/L  Senior Occupational Therapist   Web Page or Secure Chat: Elwin Mocha

## 2021-07-19 NOTE — Telephone Encounter (Signed)
LOV  3.23.23  NOV - none -  LABS  12.15.22

## 2021-07-19 NOTE — Plan of Care (Signed)
HOME CARE AGENCY CHOICE    Medicare regulations require that hospitals advise patients that they may choose to receive services from any home health agency.  Patient/family choice of agency is noted with an X by that agency's name.      Agencies: [x]  [facility] ([place], [place], [place], [place], [place], [place] and [place])       []  [facility]       []  [facility]      []  [facility] [place]        []  Other ____________________      Home Care Agency Representative alerted:    Name: Email  Date: 07/11/2021  Time:       *[facility] along with [facility],  Hospital and [facility] are all part of the [place] [facility] 's health care system.

## 2021-07-19 NOTE — Progress Notes (Signed)
Physical Therapy    Treatment session completed.  Patient is functionally appropriate to safely discharge home at this time.      Written HEP and walking protocol were issued to the patient.     Discharge recommendation:  Anticipate return to prior living arrangement, Intermittent supervision/assist, Home PT  Equipment recommendations upon discharge: None  Mobility recommendations for nursing while in hospital: 1A with RW     PT Adult Assessment - 07/19/21 0847        PT Tracking    PT TRACKING PT Assigned     Type of Session follow up/treatment     SW Request? No        Treatment Day    Treatment Day (HH / URR) 2        Precautions/Observations    Precautions used Yes     Weight Bearing Status Right Lower Extremity - weight bearing as tolerated     LDA Observation None     Was patient wearing a mask? Yes     PPE worn by writer [facility]     Fall Precautions General falls precautions;Patient educated to use call light for nursing assist prior to getting up        Pain Assessment    *Is the patient currently in pain? Yes     Pain (Before,During, After) Therapy Before     0-10 Scale 4     Pain Location Knee     Pain Orientation Right     Pain Descriptors Aching     Pain Intervention(s) Repositioned;Cold applied        Vision     Current Vision Adequate for PT session        Cognition    Cognition Tested     Arousal/Alertness Appropriate responses to stimuli     Orientation Alert and oriented x4     Ability to Follow Instructions Follows all commands and directions without difficulty        Bed Mobility    Bed mobility Tested     Supine to Sit Modified independent (device)     Sit to Supine Modified independent (device)     Additional comments Patient required increased effort however able to perform at this time without assistance.        Transfers    Transfers Tested     Stand Pivot Transfers Modified  independent     Sit to Stand Modified independent (device)     Stand to sit Modified independent (device)      Transfer Assistive Device rolling walker     Additional comments from edge of bed and recliner. Good use of B UE for support. Increased time required to transition positions. No loss of balance noted.        Mobility    Mobility Tested     Weight Bearing Status Right Lower Extremity - weight bearing as tolerated     Gait Pattern Decreased cadence;Decreased R step length;Decreased R step height;Decreased L step length;Decreased L step height     Ambulation Assist Modified independent (device)     Ambulation Distance (Feet) 350     Ambulation Assistive Device rolling walker     Stairs Assistance Modified independent (device)     Stair Management Technique One rail;Step to pattern;Forwards;With cane     Number of Stairs 4 x 4     Platform Step Modified independent (device)     Additional comments Lance Peters tolerated increased ambulation distance and stair training well this  morning. Increased time spent reviewing proper sequencing for stair training at this time with carry over noted with multiple trials. No loss of balance noted with mobility.        Family/Caregiver Training`    Patient/Family/Caregiver training Yes     Patient training Role of physical therapy in hospital and plan for evaluation and follow up;Discharge planning;Use of assistive device;Therapeutic exercises, including recommendation of frequency of therapeutic exercises to be completed by patient throughout day;Call don't fall, purpose of bed/chair alarm, and recommendation for nursing assistance during all oob mobility        Therapeutic Exercises    Exercises Performed LE     Ankle Pumps Bilaterally     Ankle Pumps-Assist Active     Ankle Pumps-Reps 10     Glut Sets-Reps 10     Heel Raises, Standing Bilaterally     Heel Raises, Standing-Assist Active     Heel Raises, Standing-Reps 10     Hip Flexion, Standing Right     Hip Flexion, Standing-Assist Active     Hip Flexion, Standing-Reps 10     Quad Sets Right     Quad Sets-Reps 10        Balance     Balance Tested     Sitting - Static Independent     Sitting - Dynamic Independent     Standing - Static Independent;Supported     Standing - Dynamic Independent;Supported        Functional Outcome Measures    Functional Outcome Measures Yes        PT AM-PAC Mobility    Turning over in bed? 4     Sitting down on and standing up from a chair with arms? 4     Moving from lying on back to sitting on the side of the bed? 4     Moving to and from a bed to a chair? 4     Need to walk in hospital room? 4     Climbing 3 - 5 steps with a railing? 4     Total Raw Score 24     Standardized Score - Calculated 61.14     % Functional Impairment - Calculated 0%        Gait Speed    Assistive Device rolling walker     Distance Walked (meters) 3.05 m     Time it took to walk distance (seconds) 5.9 sec     Gait speed (m/sec) 0.52 m/sec        Assessment    Brief Assessment Remains appropriate for skilled therapy;Patient demonstrates adequate mobility skills to return home     Problem List Impaired LE ROM;Impaired LE strength;Impaired balance;Impaired transfers;Impaired ambulation;Impaired bed mobility;Impaired stair navigation;Impaired functional mobility;Pain contributing to impairment     Patient / Family Goal home        Plan/Recommendation    PT Treatment Interventions Restorative PT     PT Frequency 5-7x/wk     PT Mobility Recommendations 1A with RW     PT Referral Recommendations OT;SW;Home care     PT Discharge Recommendations Anticipate return to prior living arrangement;Intermittent supervision/assist;Home PT     PT Discharge Equipment Recommended None     PT Assessment/Recommendations Reviewed With: Patient;Nursing     Next PT Visit Cleared PT; progress per protocol     PT needs to see patient prior to DC  No        Time Calculation    PT Timed Codes  32     PT Untimed Codes 0     PT Unbilled Time 0     PT Total Treatment 32        Plan and Onset date    Plan of Care Date 07/18/21     Onset Date 07/18/21     Treatment Start  Date 07/18/21               AMPAC Score Interpretation:  Adapted from Roxanne Gates, et al Association of AM-PAC ?6 Clicks? Basic Mobility and Daily Activity Scores with discharge destination, 2021  AM-PAC Raw Score of > 16 (standardized score > 40.78, % impairment <  54%) indicates a high likelihood of discharge to home     Adapted from Loma Sender al Prediction of disposition within 48-hours of hospital admission using patient mobility scores, 2019  AM-PAC raw score  ? 15 (standardized score ? 39, % impairment  < 58%) indicates a high probability of a discharge to home  AM-PAC raw score 9-14 (standardized score ? 31, % impairment 81%-61%) and age < 61 is more likely to discharge to home  AM-PAC raw score 9-14 (standardized score ? 31, % impairment 81%-61%) and age > 76 is more likely to require post-acute care  AM-PAC raw score < 9 (standardized score < 31, % impairment > 81%) indicates pt is more likely to require post-acute care    Gait Speed Score Interpretation:  Leodis Liverpool, Kennyth Arnold PT, PhD; Rexanne Mano PT, PhD. ?Walking Speed: the Sixth Vital Sign." Journal of Geriatric Physical Therapy 32. 2 (2009) : 2-9.)  ? 0 m/s -0.4 m/s: Household walker; Needs intervention to reduce fall risk; Dependent with ADL's and IADL's  ? 0.4 m/s -0.6 m/s: [employer]; Needs intervention to reduce fall risk  ? 0.6 m/s -1.0 m/s: [employer]; Needs intervention to reduce fall risk  ? m/s- 1.4 m/s: Normal Walking speed, Cross the [employer], Less likely to have adverse events.         Blanchard Kelch, PTA  Web page or secure chat Blanchard Kelch

## 2021-07-22 ENCOUNTER — Telehealth: Payer: Self-pay

## 2021-07-22 NOTE — Telephone Encounter (Signed)
Reviewed post op question, no concerns noted.Instructed patient to call the Orthopedic Nurse Navigators with any questions or concerns

## 2021-08-06 ENCOUNTER — Ambulatory Visit: Payer: BLUE CROSS/BLUE SHIELD | Admitting: Orthopedic Surgery

## 2021-08-06 ENCOUNTER — Encounter: Payer: Self-pay | Admitting: Orthopedic Surgery

## 2021-08-06 ENCOUNTER — Other Ambulatory Visit: Payer: Self-pay

## 2021-08-06 VITALS — BP 142/78 | Ht 70.0 in | Wt 175.0 lb

## 2021-08-06 DIAGNOSIS — M1711 Unilateral primary osteoarthritis, right knee: Secondary | ICD-10-CM

## 2021-08-06 MED ORDER — OXYCODONE HCL 5 MG PO TABS *I*
5.0000 mg | ORAL_TABLET | ORAL | 0 refills | Status: DC | PRN
Start: 2021-08-06 — End: 2022-01-07

## 2021-08-06 NOTE — Progress Notes (Signed)
ADULT POST-OP TKA    HPI: Lance Peters presents today for a 2 week(s) post-op visit following a right total knee arthroplasty performed on 07/18/21. Patient states pain is 4/10 in severity. Pain is well controlled with acetaminophen and oxycodone.  The patient is performing home exercises and participating in home physical therapy.  Range of motion has been progressively improving. The patient is using a cane for ambulation. The patient is taking aspirin twice daily and wearing compression stockings for DVT prophylaxis. Denies fever, chills, SOB. Denies lower extremity numbness or tingling. Denies drainage or redness from surgical incision. States an overall improvement with knee pain symptoms from perioperative baseline.    EXAM: Well-developed, well-nourished, appropriately dressed, healthy appearing 61 y.o.-year-old male in no apparent distress. Mood and affect appropriate. A+O x3. Pt seated comfortably in chair. Able to rise independently from seated position.     Focused musculoskeletal exam was performed today and demonstrates:  . No antalgic gait without an observable limp   . Right knee ROM -3-100   . Knee joint is grossly stable   . Surgical incision is healing well, well approximated. There is not significant effusion   . Calf is soft and nontender, distal extremity CMS intact  . 5/5 DF/PF, ITLT tibial and peroneal nerve, 2+ DP/PT pulse    Radiographs were not obtained at today's visit. Previous x-rays were reviewed, showing a total knee arthroplastywith normalalignment.    ASSESSMENT: s/p right TKA 07/18/21    PLAN:  The patient's x-ray findings and underlying diagnosis reviewed. Patient plans to continue to participate in home PT and the home exercise program. Continue with tylenol, ice, rest and narcotics on an as necessary basis.  Continue using a cane as needed for gait safety. Importance of following dental antibiotic prophylaxis precautions reviewed. Acceptable low-impact exercise activities were  reviewed. All ambulatory activities to be performed within the confines of comfort. Additional questions were invited and answered.    Follow up visit at 6 week(s) postop or sooner when necessary for reexamination of the right knee.    Orders Next Visit: 3 views right knee    Charlyne Quale, NP  Division of Adult Reconstruction  Department of Orthopaedic Surgery  [place] [facility] [place]    Answers for HPI/ROS submitted by the patient on 07/30/2021  What is your goal for today's visit?: Follow up  Was this the result of an injury?: No  What is your pain level?: 4/10  Please describe the quality of your pain: : aching, sore  What diagnostic workup have you had for this condition?: blood work, X-ray  Is this a work related condition? : No  Current work status: : no work  Fever: No  Chills: No  Numbness: No  Tingling: No

## 2021-08-06 NOTE — Patient Instructions (Signed)
PLAN:  1.  OK to shower - do not soak.    2.  Continue aspirin for blood clot prophylaxis.      3.  TED/compression stockings for 14 days after surgery    4.  Use a cane for at least 4 weeks post op    5.  No driving for 4 weeks for right leg surgery, 2 weeks left leg surgery (from date of surgery).     6.  Continue PT.

## 2021-08-29 ENCOUNTER — Other Ambulatory Visit: Payer: Self-pay | Admitting: Primary Care

## 2021-08-29 NOTE — Telephone Encounter (Signed)
Last OV w/ PCP 03/26/21, w/ LC 06/27/21  No upcoming visits  Last labs 07/18/21

## 2021-09-03 ENCOUNTER — Ambulatory Visit: Payer: BLUE CROSS/BLUE SHIELD | Admitting: Orthopedic Surgery

## 2021-09-03 ENCOUNTER — Ambulatory Visit
Admission: RE | Admit: 2021-09-03 | Discharge: 2021-09-03 | Disposition: A | Discharge status: Home / Self Care | Payer: BLUE CROSS/BLUE SHIELD | Source: Ambulatory Visit | Attending: Adult Health | Admitting: Adult Health

## 2021-09-03 ENCOUNTER — Other Ambulatory Visit: Payer: Self-pay

## 2021-09-03 ENCOUNTER — Encounter: Payer: Self-pay | Admitting: Orthopedic Surgery

## 2021-09-03 VITALS — BP 136/79 | Ht 70.0 in | Wt 175.0 lb

## 2021-09-03 DIAGNOSIS — M1711 Unilateral primary osteoarthritis, right knee: Secondary | ICD-10-CM

## 2021-09-03 DIAGNOSIS — Z471 Aftercare following joint replacement surgery: Secondary | ICD-10-CM

## 2021-09-03 DIAGNOSIS — Z96651 Presence of right artificial knee joint: Secondary | ICD-10-CM

## 2021-09-03 DIAGNOSIS — M1712 Unilateral primary osteoarthritis, left knee: Secondary | ICD-10-CM

## 2021-09-03 MED ORDER — GABAPENTIN 300 MG PO CAPSULE *I*
300.0000 mg | ORAL_CAPSULE | Freq: Every evening | ORAL | 0 refills | Status: AC
Start: 2021-09-03 — End: 2021-09-17

## 2021-09-03 NOTE — Patient Instructions (Signed)
VIEW YOUR HEALTH INFORMATION ONLINE TODAY!    Below is your activation code to sign up for MyChart, [facility]’s free website that allows you to view most test results, send and receive messages from your doctor, renew your prescriptions, request an appointment, and much more!    How Do I Sign Up?    Important:  You must have an email address to sign up for MyChart.    1. Go to [email or web address]  2. Click on the Sign Up (I have a code) link under "New User?" located in the blue box on the left hand side of the screen.    3. Enter your MyChart Activation Code: Activation code not generated  4. Current [facility] MyChart Status: Active  5. Enter your Date of Birth, Zip Code and Phone Number, and click NEXT.  6. Continue following the instructions to complete your MyChart sign up.    • You also may want to write down your Username and Password below:    MY USERNAME:  _______________________________________________    MY PASSWORD: _______________________________________________    Additional Information    If you are not the patient, but you are involved in the health care of this patient, DO NOT USE THIS CODE!  Instead, go to the MyChart website [email or web address]) and click on “Access for My Kids/Family/Friends” below the username and password fields.    Questions?    Call our MyChart Customer Service Center, 8 a.m. to 5 p.m.   Weekdays: [phone]; choose Option 1

## 2021-09-03 NOTE — Progress Notes (Signed)
ADULT POST OP TKR    Lance Peters presents today for a post-op visit following aright total knee arthroplasty performed 07/18/2021.  Patient patient was last evaluated on 08/06/2021.  States overall his knee pain is a 6 out of 10.  Still uses oxycodone 1 tablet at bedtime and occasionally 1 during the day.  He supplements this patient his medications for pain with Tylenol.  He still uses a cane for gait safety.  Patient states he is ready to begin outpatient physical therapy.  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  Tentative return to work date is 10/20/2021.    Exam: Well-developed, well-nourished, healthy-appearing 60year old male in no apparent distress.  Mood and affect appropriate.  Sensorium clear; oriented X 3.   Patient seated comfortably in an examining room chair.  Able to arise independently from a seated position.  Normal cadence gait without observable limp.  Right knee exam demonstrates neutral knee alignment.  Active assisted knee range of motion -5 to 115 degrees.  The knee joint is stable.  Surgical incision healing well without signs of infection.  Mild knee effusion present.  No unusual warmth or redness noted.  Calf is soft and nontender.    Radiographs were obtained and personally reviewed. These show a total knee arthroplasty with normal alignment.    Impression: Right TKR-07/18/2021.     Plan:  The patient's x-ray findings and underlying diagnosis reviewed.  The patient was seen and evaluated by Dr. Ardine Eng as well.  Patient congressional regarding ongoing postoperative improvement.  Prescription provided to begin outpatient supervised physical therapy as per TKR protocol.  Prescription for gabapentin 300 mg nightly to help with night pain and insomnia.  Importance of following dental antibiotic prophylaxis precautions reviewed.  Acceptable low-impact exercise activities were once again reviewed.  All ambulatory activities to be performed within the confines of  comfort.  Additional questions invited and answered.     Follow up at 6 months postop or sooner as needed.    Orders Next Visit: None unless clinically warranted.  Answers for HPI/ROS submitted by the patient on 08/27/2021  What is your goal for today's visit?: Post opp Follow up  Was this the result of an injury?: No  What is your pain level?: 5/10  Please describe the quality of your pain: : aching, locking, radiating, sore, stiffness  What treatments have you tried for this condition?: surgery  Progression since onset: : gradually improving  Is this a work related condition? : No  Current work status: : no work  Fever: No  Chills: No  Numbness: No  Tingling: No

## 2021-09-05 ENCOUNTER — Ambulatory Visit
Payer: BLUE CROSS/BLUE SHIELD | Attending: Orthopedic Surgery | Admitting: Rehabilitative and Restorative Service Providers"

## 2021-09-05 ENCOUNTER — Other Ambulatory Visit: Payer: Self-pay

## 2021-09-05 DIAGNOSIS — M1711 Unilateral primary osteoarthritis, right knee: Secondary | ICD-10-CM | POA: Insufficient documentation

## 2021-09-05 NOTE — Progress Notes (Signed)
[facility]   POST-OPERATIVE KNEE EVALUATION    Diagnosis:    ICD-10-CM ICD-9-CM   1. Primary osteoarthritis of right knee  M17.11 715.16     TKA    Date of Surgery: 07/18/2021    Referring MD: Brand Males, MD    History  Lance Peters is a 61 y.o. male who is present today for right knee care s/p TKA  Pt reports pain is moderate.  Symptom location: Anterior, right  Relevant symptoms:  Aching, Pain , Decreased ROM, Decreased strength  Symptom frequency: Constant  Symptom intensity:  (0 - 10 scale): Now 4 Best 4 Worst 8   Night Pain: Yes   Restful sleep:   No  Morning Pain/Stiffness: Increased   Symptoms worsen with: Squatting, Stairs, Weight bearing, Pivoting, Sitting, Standing, Walking, Bending  Symptoms improve with: Rest, Ice, Heat, Medication  Assistive device:  straight cane-notes that he has been walking around the house without the cane a bunch  Patient's goals for therapy: Perform all self care ADLs (bathing, hygiene, dressing, eating) independently, Perform ambulation at safe level with or without assistive device to promote safe community activity/ social function (shopping, attendance at appointments/events), Reduce pain, Reduce volume/swelling, Increase ROM / flexibility, Increase strength, Achieve independence with home program for self care / condition management, Return to work, Return to sports / activities  Post-op note reviewed: Yes    Occupation and Activities  Work status: Off work - anticipated as temporary  Job title/type of work: Producer, television/film/video work- Copywriter, advertising, 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   Stresses/physical demands of job: Push, Pull, Overhead, Heavy Lifting, Awkward Positions, Prolonged Standing, Fine Motor Manipulation, Vibration exposure, Tool Use, Repeated grasping  Stresses/physical demands of home: Self Care and Housekeeping  Sport(s)/Activities: Biking and Walking     Objective  Observation: Atrophy, Edema  Gait:  FWB, Antalgic    Lumbar  Screen:  Within Functional Limits  Neurologic:  Sensation:  LE  Light touch, Intact to gross screen  Myotomes:  LE Intact to gross screen    Palpation: swelling  Incision:  Benign and No evidence of infection      ROM/Strength  Full strength/ROM assessment deferred secondary to acute post-op status    KNEE LEFT RIGHT STRENGTH    PROM AROM PROM AROM Left Right   Flexion   116  NA NA   Extension   Lack 12  NA NA            Quad Isometric (Quad set)     Good Fair   SLR     Good Fair         Special Tests:   Homans: negative  Remainder of LE special tests deferred secondary to acute post-op status      Functional:  Lift greater than 5 lbs - unable to perform.  Stand more than 5 minutes, - able, with pain to perform.  Sit more than 5 minutes - able, with pain to perform.  Walk more than 5 minutes - able, with pain to perform.  Ascend stairs with reciprocal gait - unable to perform.  Descend stairs with reciprocal gait - unable to perform.  Return to work with restrictions - unable to perform.  Return to work full duty - unable to perform.  Return to sport/activities - unable to perform.    Assessment:    Findings consistent with 61 y.o. male with TKA with pain, swelling, ROM limitations, strength limitations, functional limitations  Personal factors affecting treatment/recovery:   none identified  Comorbidities affecting treatment/recovery:   none noted  Clinical presentation:   stable  Patient complexity:     low level as indicated by above stability of condition, personal factors, environmental factors and comorbidities in addition to patient symptom presentation and impairments found on physical exam.    Prognosis:  Good   Contraindications/Precautions/Limitation:  Per protocol and Per diagnosis  Short Term Goals: (4 week(s)): Decrease c/o max pain to < 4/10 and Minimal assistance with HEP/ education concepts  Long Term Goals: (6 month(s)): Pain/Sx 0 - minimal, ROM/ flexibility WNL , Restoration of functional  strength, Independent with HEP/education , Functional return to ADLs / activities without limitations     Treatment Plan:   Options / plan reviewed with patient/family:  Yes  Freq 1-2 times per week/month with adjusted visit frequency per patient status/protocol for 6 month(s)    Treatment plan inclusive of:  Exercise: AROM, AAROM, PROM, Stretching, Strengthening, Progressive Resistive, Aerobic exercise  Manual Techniques:  Joint mobilization, Soft tissue release/massage  Modalities:  Cold pack, Functional/Therapeutic activites per flowsheet, Joint Mobilizations, Moist heat, Ther Exercise per flowsheet  Functional: Proprioception/Dynamic stability, Functional rehab, Work Orthoptist, Economist, Investment banker, operational, Balance , Endurance training    Thank you for referring this patient to [facility].    Rebeca Allegra, PT    DOS:    WEIGHTBEARING STATUS as tolerated       Bike  5 min    Heel slide 5sx10   Heel prop 5 min   Prone hang 5 min   QS on towel 5sx20   SLR x20              Minutes   Time Based CPT  Physical Performance test, Therapeutic Exercise, Therapeutic Activities, NM Re-Education, Manual Therapy, Gait Training, Massage, Aquatic Therapy, Canalith Repositioning, Iontophoresis, Ultrasound, Orthotic fitting/training, Prosthetic fitting 30   Service Based (untimed) CPT  PT/OT evaluation, PT/OT re-eval, E-stim-unattended, Mechanical traction, Vasopneumatic device 15   Unbilled time (rest, etc)        Total Treatment Time 45

## 2021-09-09 ENCOUNTER — Other Ambulatory Visit: Payer: Self-pay

## 2021-09-09 ENCOUNTER — Ambulatory Visit: Payer: BLUE CROSS/BLUE SHIELD | Admitting: Rehabilitative and Restorative Service Providers"

## 2021-09-09 DIAGNOSIS — M1711 Unilateral primary osteoarthritis, right knee: Secondary | ICD-10-CM

## 2021-09-09 NOTE — Progress Notes (Signed)
[facility] Orthopedic Sports/Spine Rehabilitation  PT Treatment Note    Today's Date: 09/09/2021    Name: Lance Peters  DOB: [birth year]  Referring Physician: Rolene Arbour, PA  Diagnosis:   1. Primary osteoarthritis of right knee            Visit #: 2    Subjective:  Pain Assessment: 4  Pt reports an improvement in pain intensity and pain frequency since previous treatment session.  Pain continues to be about the same, he has been able to walk around more without the cane, brought the seat height up on his bike at home and has been able to ride on that now.     Objective:  ROM -  Right, Knee,    KNEE LEFT RIGHT STRENGTH    PROM AROM PROM AROM Left Right   Flexion   116  NA NA   Extension   Lack 10  NA NA            Quad Isometric (Quad set)     Good Fair   SLR     Good Fair     Strength - Therapeutic Exercises per flowsheet  Function: - Improved- see above  Education:  Updated HEP, Verbal cues for ther ex, Joint ed concepts    Objective       DOS:    WEIGHTBEARING STATUS as tolerated       Bike  5 min    Heel slide 5sx10   Heel prop 5 min   Prone hang 5 min   QS on towel 5sx20   SLR x20               Treatment:  Cold pack, Ther Exercise per flowsheet    Assessment:   Continue to emphasize the importance of regaining his extension ROM, he was bale to gain 2 degrees since last session today.        Plan of Care:  Continue per Plan of care -  As written; Patient would benefit from skilled rehabilitation services to address the above impairments to restore functional capacity.    Thank you for referring this patient to [facility] Orthopaedics - Sports and Spine Rehabilitation    Rebeca Allegra, PT       Minutes   Time Based CPT  Physical Performance test, Therapeutic Exercise, Therapeutic Activities, NM Re-Education, Manual Therapy, Gait Training, Massage, Aquatic Therapy, Canalith Repositioning, Iontophoresis, Ultrasound, Orthotic fitting/training, Prosthetic fitting 40   Service Based  (untimed) CPT  PT/OT evaluation, PT/OT re-eval, E-stim-unattended, Mechanical traction, Vasopneumatic device    Unbilled time (rest, etc)        Total Treatment Time 40

## 2021-09-10 LAB — HM DIABETES EYE EXAM

## 2021-09-12 ENCOUNTER — Ambulatory Visit: Payer: BLUE CROSS/BLUE SHIELD | Admitting: Rehabilitative and Restorative Service Providers"

## 2021-09-12 ENCOUNTER — Other Ambulatory Visit: Payer: Self-pay

## 2021-09-12 DIAGNOSIS — M1711 Unilateral primary osteoarthritis, right knee: Secondary | ICD-10-CM

## 2021-09-12 NOTE — Progress Notes (Signed)
[facility] Orthopedic Sports/Spine Rehabilitation  PT Treatment Note    Today's Date: 09/12/2021    Name: Lance Peters  DOB: 1960/11/25  Referring Physician: Rolene Arbour, PA  Diagnosis:   1. Primary osteoarthritis of right knee            Visit #: 3    Subjective:  Pain Assessment: 4  Pt reports no significant changes in pain or function since previous treatment session.  Patients knee has been pretty sore since last session but he notes that he has been working hard on his HEP, walking around a fair amount without his cane.      Objective:  ROM -  Right, Knee,    KNEE LEFT RIGHT STRENGTH    PROM AROM PROM AROM Left Right   Flexion   121  NA NA   Extension   Lack 9  NA NA            Quad Isometric (Quad set)     Good Fair   SLR     Good Fair     Strength - Therapeutic Exercises per flowsheet  Function: - Improved- see above  Education:  Updated HEP, Verbal cues for ther ex, Joint ed concepts    Objective       DOS:    WEIGHTBEARING STATUS as tolerated       Bike  5 min    Heel slide 5sx10   Heel prop 5 min   Prone hang 5 min   QS on towel 5sx20   SLR x20   Standing TKE Gr 3sx20           Treatment:  Cold pack, Ther Exercise per flowsheet    Assessment:   Continue to emphasize the importance of regaining his extension ROM, he was bale to make small gains in this yet again.        Plan of Care:  Continue per Plan of care -  As written; Patient would benefit from skilled rehabilitation services to address the above impairments to restore functional capacity.    Thank you for referring this patient to [facility] Orthopaedics - Sports and Spine Rehabilitation    Rebeca Allegra, PT       Minutes   Time Based CPT  Physical Performance test, Therapeutic Exercise, Therapeutic Activities, NM Re-Education, Manual Therapy, Gait Training, Massage, Aquatic Therapy, Canalith Repositioning, Iontophoresis, Ultrasound, Orthotic fitting/training, Prosthetic fitting 40   Service Based (untimed) CPT  PT/OT  evaluation, PT/OT re-eval, E-stim-unattended, Mechanical traction, Vasopneumatic device    Unbilled time (rest, etc)        Total Treatment Time 40

## 2021-09-16 ENCOUNTER — Other Ambulatory Visit: Payer: Self-pay

## 2021-09-16 ENCOUNTER — Ambulatory Visit: Payer: BLUE CROSS/BLUE SHIELD | Admitting: Rehabilitative and Restorative Service Providers"

## 2021-09-16 DIAGNOSIS — M1711 Unilateral primary osteoarthritis, right knee: Secondary | ICD-10-CM

## 2021-09-16 NOTE — Progress Notes (Signed)
[facility] Orthopedic Sports/Spine Rehabilitation  PT Treatment Note    Today's Date: 09/16/2021    Name: Lance Peters  DOB: 09/28/1960  Referring Physician: Rolene Arbour, PA  Diagnosis:   1. Primary osteoarthritis of right knee            Visit #: 4    Subjective:  Pain Assessment: 4  Pt reports no significant changes in pain or function since previous treatment session.  Patients knee has been feeling a little bit better since last session, he continues to be working hard on his HEP and feels that his extension is improving      Objective:  ROM -  Right, Knee,    KNEE LEFT RIGHT STRENGTH    PROM AROM PROM AROM Left Right   Flexion   123  NA NA   Extension   Lack 8  NA NA            Quad Isometric (Quad set)     Good Fair   SLR     Good Fair     Strength - Therapeutic Exercises per flowsheet  Function: - Improved- see above  Education:  Updated HEP, Verbal cues for ther ex, Joint ed concepts    Objective       DOS:    WEIGHTBEARING STATUS as tolerated       Bike  5 min    Heel slide 5sx10   Heel prop 5 min   Prone hang 5 min   QS on towel 5sx20   SLR x20   Standing TKE Gr 3sx20   Standing DL calf raise Z61       Treatment:  Cold pack, Ther Exercise per flowsheet    Assessment:   Continue to emphasize the importance of regaining his extension ROM, he was bale to make small gains in this yet again. He is doing well post operatively       Plan of Care:  Continue per Plan of care -  As written; Patient would benefit from skilled rehabilitation services to address the above impairments to restore functional capacity.    Thank you for referring this patient to [facility] Orthopaedics - Sports and Spine Rehabilitation    Rebeca Allegra, PT       Minutes   Time Based CPT  Physical Performance test, Therapeutic Exercise, Therapeutic Activities, NM Re-Education, Manual Therapy, Gait Training, Massage, Aquatic Therapy, Canalith Repositioning, Iontophoresis, Ultrasound, Orthotic fitting/training,  Prosthetic fitting 40   Service Based (untimed) CPT  PT/OT evaluation, PT/OT re-eval, E-stim-unattended, Mechanical traction, Vasopneumatic device    Unbilled time (rest, etc)        Total Treatment Time 40

## 2021-09-17 ENCOUNTER — Encounter: Payer: Self-pay | Admitting: Primary Care

## 2021-09-19 ENCOUNTER — Ambulatory Visit: Payer: BLUE CROSS/BLUE SHIELD | Admitting: Rehabilitative and Restorative Service Providers"

## 2021-09-19 ENCOUNTER — Other Ambulatory Visit: Payer: Self-pay

## 2021-09-19 DIAGNOSIS — M1711 Unilateral primary osteoarthritis, right knee: Secondary | ICD-10-CM

## 2021-09-19 NOTE — Progress Notes (Signed)
[facility] Orthopedic Sports/Spine Rehabilitation  PT Treatment Note    Today's Date: 09/19/2021    Name: Tonya Kopas  DOB: [birth year]  Referring Physician: Rolene Arbour, PA  Diagnosis:   1. Primary osteoarthritis of right knee            Visit #: 5    Subjective:  Pain Assessment: 4  Pt reports no significant changes in pain or function since previous treatment session.  Patients knee has been feeling a little bit better since last session, he was able to go the entire day without his cane yesterday without any issues and is not too sore from this.      Objective:  ROM -  Right, Knee,    KNEE LEFT RIGHT STRENGTH    PROM AROM PROM AROM Left Right   Flexion   126  NA NA   Extension   Lack 7  NA NA            Quad Isometric (Quad set)     Good Fair   SLR     Good Fair     Strength - Therapeutic Exercises per flowsheet  Function: - Improved- see above  Education:  Updated HEP, Verbal cues for ther ex, Joint ed concepts    Objective       DOS:    WEIGHTBEARING STATUS as tolerated       Bike  5 min    Heel slide 5sx10   Heel prop 5 min   Prone hang 5 min   QS on towel 5sx20   SLR x20   Standing TKE Gr 3sx20   Standing DL calf raise Z61   Clock step x5 B               Treatment:  Cold pack, Ther Exercise per flowsheet    Assessment:   Continue to emphasize the importance of regaining his extension ROM, he was bale to make small gains in this yet again. He is doing well post operatively       Plan of Care:  Continue per Plan of care -  As written; Patient would benefit from skilled rehabilitation services to address the above impairments to restore functional capacity.    Thank you for referring this patient to [facility] Orthopaedics - Sports and Spine Rehabilitation    Rebeca Allegra, PT       Minutes   Time Based CPT  Physical Performance test, Therapeutic Exercise, Therapeutic Activities, NM Re-Education, Manual Therapy, Gait Training, Massage, Aquatic Therapy, Canalith Repositioning,  Iontophoresis, Ultrasound, Orthotic fitting/training, Prosthetic fitting 40   Service Based (untimed) CPT  PT/OT evaluation, PT/OT re-eval, E-stim-unattended, Mechanical traction, Vasopneumatic device    Unbilled time (rest, etc)        Total Treatment Time 40

## 2021-09-25 ENCOUNTER — Encounter: Payer: Self-pay | Admitting: Orthopedic Surgery

## 2021-09-26 ENCOUNTER — Other Ambulatory Visit: Payer: Self-pay

## 2021-09-26 ENCOUNTER — Ambulatory Visit: Payer: BLUE CROSS/BLUE SHIELD | Admitting: Rehabilitative and Restorative Service Providers"

## 2021-09-26 DIAGNOSIS — M1711 Unilateral primary osteoarthritis, right knee: Secondary | ICD-10-CM

## 2021-09-26 NOTE — Progress Notes (Signed)
[facility] Orthopedic Sports/Spine Rehabilitation  PT Treatment Note    Today's Date: 09/26/2021    Name: Lance Peters  DOB: Jan 02, 1961  Referring Physician: Rolene Arbour, PA  Diagnosis:   1. Primary osteoarthritis of right knee            Visit #: 6    Subjective:  Pain Assessment: 4  Pt reports no significant changes in pain or function since previous treatment session.  Patient has now been without his cane for a week with no issues, has been working on his HEP with no issues.       Objective:  ROM -  Right, Knee,    KNEE LEFT RIGHT STRENGTH    PROM AROM PROM AROM Left Right   Flexion   127  NA NA   Extension   Lack 6  NA NA            Quad Isometric (Quad set)     Good Fair   SLR     Good Fair     Strength - Therapeutic Exercises per flowsheet  Function: - Improved- see above  Education:  Updated HEP, Verbal cues for ther ex, Joint ed concepts    Objective       DOS:    WEIGHTBEARING STATUS as tolerated       Bike  5 min    Heel slide 5sx10   Heel prop 5 min   Prone hang 5 min   QS on towel 5sx20   SLR x20   Standing TKE Gr 3sx20   Standing DL calf raise Z61   Clock step x5 B   Mini squat x10            Treatment:  Cold pack, Ther Exercise per flowsheet    Assessment:   Continue to emphasize the importance of regaining his extension ROM, he was bale to make small gains in this yet again. He is doing well post operatively. Reviewed modifications to his HEP.        Plan of Care:  Continue per Plan of care -  As written; Patient would benefit from skilled rehabilitation services to address the above impairments to restore functional capacity.    Thank you for referring this patient to [facility] of [facility] Orthopaedics - Sports and Spine Rehabilitation    Rebeca Allegra, PT       Minutes   Time Based CPT  Physical Performance test, Therapeutic Exercise, Therapeutic Activities, NM Re-Education, Manual Therapy, Gait Training, Massage, Aquatic Therapy, Canalith Repositioning, Iontophoresis,  Ultrasound, Orthotic fitting/training, Prosthetic fitting 40   Service Based (untimed) CPT  PT/OT evaluation, PT/OT re-eval, E-stim-unattended, Mechanical traction, Vasopneumatic device    Unbilled time (rest, etc)        Total Treatment Time 40

## 2021-09-30 ENCOUNTER — Telehealth: Payer: Self-pay | Admitting: Orthopedic Surgery

## 2021-09-30 ENCOUNTER — Ambulatory Visit: Payer: BLUE CROSS/BLUE SHIELD | Admitting: Rehabilitative and Restorative Service Providers"

## 2021-09-30 ENCOUNTER — Other Ambulatory Visit: Payer: Self-pay

## 2021-09-30 DIAGNOSIS — M1711 Unilateral primary osteoarthritis, right knee: Secondary | ICD-10-CM

## 2021-09-30 NOTE — Telephone Encounter (Signed)
Patient called to see if he could extend his disability leave until 12/02/21. He has a physically demanding job and at this time with how PT is going, he feels he will not be ready until the end of August. Please advise if the office will agree with patients request and I will fill out the paperwork and send it back to the insurance company.

## 2021-10-01 NOTE — Progress Notes (Signed)
[facility] Orthopedic Sports/Spine Rehabilitation  PT Treatment Note    Today's Date: 10/01/2021    Name: Lance Peters  DOB: July 29, 1960  Referring Physician: Laurance Flatten, MD  Diagnosis:   1. Primary osteoarthritis of right knee            Visit #: 7    Subjective:  Pain Assessment: 4  Pt reports no significant changes in pain or function since previous treatment session.  Patient continues to be without his cane which has been going well. He continues to be working hard on his HEP, no questions regarding this.      Objective:  ROM -  Right, Knee,    KNEE LEFT RIGHT STRENGTH    PROM AROM PROM AROM Left Right   Flexion   127  NA NA   Extension   Lack 5  NA NA            Quad Isometric (Quad set)     Good Fair   SLR     Good Fair     Strength - Therapeutic Exercises per flowsheet  Function: - Improved- see above  Education:  Updated HEP, Verbal cues for ther ex, Joint ed concepts    Objective       DOS:    WEIGHTBEARING STATUS as tolerated       Bike  5 min    Heel slide 5sx10   Heel prop 5 min   Prone hang 5 min   QS on towel 5sx20   SLR x20   Standing TKE Gr 3sx20   Standing DL calf raise Z61   Clock step x5 B   Mini squat x10            Treatment:  Cold pack, Ther Exercise per flowsheet    Assessment:   Continue to emphasize the importance of regaining his extension ROM, he was bale to make small gains in this yet again. He is doing well post operatively. Reviewed modifications to his HEP.        Plan of Care:  Continue per Plan of care -  As written; Patient would benefit from skilled rehabilitation services to address the above impairments to restore functional capacity.    Thank you for referring this patient to [facility] of [facility] Orthopaedics - Sports and Spine Rehabilitation    Rebeca Allegra, PT       Minutes   Time Based CPT  Physical Performance test, Therapeutic Exercise, Therapeutic Activities, NM Re-Education, Manual Therapy, Gait Training, Massage, Aquatic Therapy, Canalith  Repositioning, Iontophoresis, Ultrasound, Orthotic fitting/training, Prosthetic fitting 40   Service Based (untimed) CPT  PT/OT evaluation, PT/OT re-eval, E-stim-unattended, Mechanical traction, Vasopneumatic device    Unbilled time (rest, etc)        Total Treatment Time 40

## 2021-10-03 ENCOUNTER — Ambulatory Visit: Payer: BLUE CROSS/BLUE SHIELD | Admitting: Rehabilitative and Restorative Service Providers"

## 2021-10-03 ENCOUNTER — Other Ambulatory Visit: Payer: Self-pay

## 2021-10-03 DIAGNOSIS — M1711 Unilateral primary osteoarthritis, right knee: Secondary | ICD-10-CM

## 2021-10-03 NOTE — Progress Notes (Signed)
[facility] Orthopedic Sports/Spine Rehabilitation  PT Treatment Note    Today's Date: 10/03/2021    Name: Lance Peters  DOB: 1960-07-09  Referring Physician: Rolene Arbour, PA  Diagnosis:   1. Primary osteoarthritis of right knee            Visit #: 8    Subjective:  Pain Assessment: 4  Pt reports no significant changes in pain or function since previous treatment session.  Patient is doing well overall, continues to be working hard on his extension at home. Has been able to be on his feet more.      Objective:  ROM -  Right, Knee,    KNEE LEFT RIGHT STRENGTH    PROM AROM PROM AROM Left Right   Flexion   128  NA NA   Extension   Lack 5  NA NA            Quad Isometric (Quad set)     Good Fair   SLR     Good Fair     Strength - Therapeutic Exercises per flowsheet  Function: - Improved- see above  Education:  Updated HEP, Verbal cues for ther ex, Joint ed concepts    Objective       DOS:    WEIGHTBEARING STATUS as tolerated       Bike  5 min    Heel slide 5sx10   Heel prop 5 min   Prone hang 5 min   QS on towel 5sx20   SLR x20   Standing TKE Gr 3sx30   Standing DL calf raise Z61   Clock step x5 B   Mini squat x30            Treatment:  Cold pack, Ther Exercise per flowsheet    Assessment:   Patient again made small gains in his ROM since last session, he is doing well post operatively. Reviewed modifications to his HEP and he understands.        Plan of Care:  Continue per Plan of care -  As written; Patient would benefit from skilled rehabilitation services to address the above impairments to restore functional capacity.    Thank you for referring this patient to [facility] of [facility] Orthopaedics - Sports and Spine Rehabilitation    Rebeca Allegra, PT       Minutes   Time Based CPT  Physical Performance test, Therapeutic Exercise, Therapeutic Activities, NM Re-Education, Manual Therapy, Gait Training, Massage, Aquatic Therapy, Canalith Repositioning, Iontophoresis, Ultrasound, Orthotic  fitting/training, Prosthetic fitting 40   Service Based (untimed) CPT  PT/OT evaluation, PT/OT re-eval, E-stim-unattended, Mechanical traction, Vasopneumatic device    Unbilled time (rest, etc)        Total Treatment Time 40

## 2021-10-07 ENCOUNTER — Other Ambulatory Visit: Payer: Self-pay

## 2021-10-07 ENCOUNTER — Ambulatory Visit
Payer: BLUE CROSS/BLUE SHIELD | Attending: Orthopedic Surgery | Admitting: Rehabilitative and Restorative Service Providers"

## 2021-10-07 DIAGNOSIS — M1711 Unilateral primary osteoarthritis, right knee: Secondary | ICD-10-CM | POA: Insufficient documentation

## 2021-10-07 NOTE — Progress Notes (Signed)
[facility] Orthopedic Sports/Spine Rehabilitation  PT Treatment Note    Today's Date: 10/07/2021    Name: Lance Peters  DOB: December 26, 1960  Referring Physician: Rolene Arbour, PA  Diagnosis:   1. Primary osteoarthritis of right knee            Visit #: 9    Subjective:  Pain Assessment: 4  Pt reports no significant changes in pain or function since previous treatment session.  Patient is doing well overall, continues to be working hard on his extension at home. Has noticed that when he moves quickly in a rotational or lateral direction he will have pain in his knee.      Objective:  ROM -  Right, Knee,    KNEE LEFT RIGHT STRENGTH    PROM AROM PROM AROM Left Right   Flexion   130  NA NA   Extension   Lack 4  NA NA            Quad Isometric (Quad set)     Good Fair   SLR     Good Fair     Strength - Therapeutic Exercises per flowsheet  Function: - Improved- see above  Education:  Updated HEP, Verbal cues for ther ex, Joint ed concepts    Objective       DOS:    WEIGHTBEARING STATUS as tolerated       Bike  5 min    Heel slide 5sx10   Heel prop 5 min   Prone hang 5 min   QS on towel 5sx20   SLR x20   Standing TKE Gr 3sx30   Standing DL calf raise Z61   Clock step x5 B   Mini squat x30    slider P, L x10        Treatment:  Cold pack, Ther Exercise per flowsheet    Assessment:   Patient again made small gains in his ROM since last session, he is doing well post operatively. Added in some more eccentric work and he was able to complete with fatigue present.        Plan of Care:  Continue per Plan of care -  As written; Patient would benefit from skilled rehabilitation services to address the above impairments to restore functional capacity.    Thank you for referring this patient to [facility] of [facility] Orthopaedics - Sports and Spine Rehabilitation    Rebeca Allegra, PT       Minutes   Time Based CPT  Physical Performance test, Therapeutic Exercise, Therapeutic Activities, NM Re-Education, Manual Therapy,  Gait Training, Massage, Aquatic Therapy, Canalith Repositioning, Iontophoresis, Ultrasound, Orthotic fitting/training, Prosthetic fitting 40   Service Based (untimed) CPT  PT/OT evaluation, PT/OT re-eval, E-stim-unattended, Mechanical traction, Vasopneumatic device    Unbilled time (rest, etc)        Total Treatment Time 40

## 2021-10-10 ENCOUNTER — Other Ambulatory Visit: Payer: Self-pay

## 2021-10-10 ENCOUNTER — Ambulatory Visit: Payer: BLUE CROSS/BLUE SHIELD | Admitting: Rehabilitative and Restorative Service Providers"

## 2021-10-10 DIAGNOSIS — M1711 Unilateral primary osteoarthritis, right knee: Secondary | ICD-10-CM

## 2021-10-10 NOTE — Progress Notes (Signed)
[facility] Orthopedic Sports/Spine Rehabilitation  PT Treatment Note    Today's Date: 10/10/2021    Name: Lance Peters  DOB: May 24, 1960  Referring Physician: Rolene Arbour, PA  Diagnosis:   1. Primary osteoarthritis of right knee            Visit #: 10    Subjective:  Pain Assessment: 4  Pt reports no significant changes in pain or function since previous treatment session.  Patient is doing well overall, continues to be working hard on his extension at home. He is overall pleased with how he is doing.      Objective:  ROM -  Right, Knee,    KNEE LEFT RIGHT STRENGTH    PROM AROM PROM AROM Left Right   Flexion   130  NA NA   Extension   Lack 3  NA NA            Quad Isometric (Quad set)     Good Fair   SLR     Good Fair     Strength - Therapeutic Exercises per flowsheet  Function: - Improved- see above  Education:  Updated HEP, Verbal cues for ther ex, Joint ed concepts    Objective       DOS:    WEIGHTBEARING STATUS as tolerated       Bike  5 min    Heel slide 5sx10   Heel prop 5 min   Prone hang 5 min   QS on towel 5sx20   SLR x20   Standing TKE Gr 3sx30   Standing DL calf raise Z61   Clock step x5 B   Mini squat x30    Slider P, L x10        Treatment:  Cold pack, Ther Exercise per flowsheet    Assessment:   Patient again made small gains in his ROM since last session, he is doing well post operatively. He is going out of town on vacation for the next week and will follow up with PT when he returns.       Plan of Care:  Continue per Plan of care -  As written; Patient would benefit from skilled rehabilitation services to address the above impairments to restore functional capacity.    Thank you for referring this patient to [facility] of [facility] Orthopaedics - Sports and Spine Rehabilitation    Rebeca Allegra, PT       Minutes   Time Based CPT  Physical Performance test, Therapeutic Exercise, Therapeutic Activities, NM Re-Education, Manual Therapy, Gait Training, Massage, Aquatic Therapy, Canalith  Repositioning, Iontophoresis, Ultrasound, Orthotic fitting/training, Prosthetic fitting 40   Service Based (untimed) CPT  PT/OT evaluation, PT/OT re-eval, E-stim-unattended, Mechanical traction, Vasopneumatic device    Unbilled time (rest, etc)        Total Treatment Time 40

## 2021-10-14 ENCOUNTER — Ambulatory Visit: Payer: BLUE CROSS/BLUE SHIELD | Admitting: Rehabilitative and Restorative Service Providers"

## 2021-10-17 ENCOUNTER — Ambulatory Visit: Payer: BLUE CROSS/BLUE SHIELD | Admitting: Rehabilitative and Restorative Service Providers"

## 2021-10-21 ENCOUNTER — Other Ambulatory Visit: Payer: Self-pay

## 2021-10-21 ENCOUNTER — Ambulatory Visit: Payer: BLUE CROSS/BLUE SHIELD | Admitting: Rehabilitative and Restorative Service Providers"

## 2021-10-21 DIAGNOSIS — M1711 Unilateral primary osteoarthritis, right knee: Secondary | ICD-10-CM

## 2021-10-21 NOTE — Progress Notes (Signed)
[facility] Orthopedic Sports/Spine Rehabilitation  PT Treatment Note    Today's Date: 10/21/2021    Name: Lance Peters  DOB: 1960/07/21  Referring Physician: Rolene Arbour, PA  Diagnosis:   1. Primary osteoarthritis of right knee            Visit #: 11    Subjective:  Pain Assessment: 4  Pt reports no significant changes in pain or function since previous treatment session.  Pain is about the same, he is just coming back from vacation with family to [place] and he did a lot of walking in the sand and around, the knee did well overall.      Objective:  ROM -  Right, Knee,    KNEE LEFT RIGHT STRENGTH    PROM AROM PROM AROM Left Right   Flexion   128  NA NA   Extension   Lack 5  NA NA            Quad Isometric (Quad set)     Good Fair   SLR     Good Fair     Strength - Therapeutic Exercises per flowsheet  Function: - Improved- see above, able to a lot of beach walking  Education:  Updated HEP, Verbal cues for ther ex, Joint ed concepts    Objective       DOS:    WEIGHTBEARING STATUS as tolerated       Bike  5 min    Heel slide 5sx10   Heel prop 5 min   Prone hang 5 min   QS on towel 5sx20   SLR x20   Standing TKE Gr 3sx30   Standing DL calf raise Z61   Clock step x5 B   Mini squat x30    Slider P, L x10    Modified step up 6in x20   Step dn 2in x20        Treatment:  Cold pack, Ther Exercise per flowsheet    Assessment:   Patient had a little bit less ROM today, not concerning based on his activities over the last week. He was able to complete some strengthening progressions to some more closed chain work as documented above with no issues.        Plan of Care:  Continue per Plan of care -  As written; Patient would benefit from skilled rehabilitation services to address the above impairments to restore functional capacity.    Thank you for referring this patient to [facility] of [facility] Orthopaedics - Sports and Spine Rehabilitation    Rebeca Allegra, PT       Minutes   Time Based CPT  Physical Performance  test, Therapeutic Exercise, Therapeutic Activities, NM Re-Education, Manual Therapy, Gait Training, Massage, Aquatic Therapy, Canalith Repositioning, Iontophoresis, Ultrasound, Orthotic fitting/training, Prosthetic fitting 40   Service Based (untimed) CPT  PT/OT evaluation, PT/OT re-eval, E-stim-unattended, Mechanical traction, Vasopneumatic device    Unbilled time (rest, etc)        Total Treatment Time 40

## 2021-10-24 ENCOUNTER — Ambulatory Visit: Payer: BLUE CROSS/BLUE SHIELD | Admitting: Rehabilitative and Restorative Service Providers"

## 2021-10-28 ENCOUNTER — Ambulatory Visit: Payer: BLUE CROSS/BLUE SHIELD | Admitting: Rehabilitative and Restorative Service Providers"

## 2021-10-28 ENCOUNTER — Other Ambulatory Visit: Payer: Self-pay

## 2021-10-28 DIAGNOSIS — M1711 Unilateral primary osteoarthritis, right knee: Secondary | ICD-10-CM

## 2021-10-28 NOTE — Progress Notes (Signed)
[facility] Orthopedic Sports/Spine Rehabilitation  PT Treatment Note    Today's Date: 10/28/2021    Name: Lance Peters  DOB: [birth year]  Referring Physician: Rolene Arbour, PA  Diagnosis:   1. Primary osteoarthritis of right knee            Visit #: 12    Subjective:  Pain Assessment: 4  Pt reports no significant changes in pain or function since previous treatment session.  Patient has been working on going down the stairs a little bit more, using the railings for support. Feels that this is getting a little bit better.      Objective:  ROM -  Right, Knee,    KNEE LEFT RIGHT STRENGTH    PROM AROM PROM AROM Left Right   Flexion   130  NA NA   Extension   Lack 3  NA NA            Quad Isometric (Quad set)     Good Fair   SLR     Good Fair     Strength - Therapeutic Exercises per flowsheet  Function: - Improved- see above, able to a lot of beach walking  Education:  Updated HEP, Verbal cues for ther ex, Joint ed concepts    Objective       DOS:    WEIGHTBEARING STATUS as tolerated       Bike  5 min    Heel slide 5sx10   Heel prop 5 min   Prone hang 5 min   QS on towel 5sx20   SLR x20   Standing TKE Gr 3sx30   Standing DL calf raise Z61   Clock step x5 B   Mini squat x30    Slider P, L x10    Modified step up 6in x20   Step dn 4in x20        Treatment:  Cold pack, Ther Exercise per flowsheet    Assessment:   Patient made gains in his ROM since last session, able to progress his closed chain program as documented above with no issues.        Plan of Care:  Continue per Plan of care -  As written; Patient would benefit from skilled rehabilitation services to address the above impairments to restore functional capacity.    Thank you for referring this patient to [facility] of [facility] Orthopaedics - Sports and Spine Rehabilitation    Rebeca Allegra, PT       Minutes   Time Based CPT  Physical Performance test, Therapeutic Exercise, Therapeutic Activities, NM Re-Education, Manual Therapy, Gait Training,  Massage, Aquatic Therapy, Canalith Repositioning, Iontophoresis, Ultrasound, Orthotic fitting/training, Prosthetic fitting 40   Service Based (untimed) CPT  PT/OT evaluation, PT/OT re-eval, E-stim-unattended, Mechanical traction, Vasopneumatic device    Unbilled time (rest, etc)        Total Treatment Time 40

## 2021-10-31 ENCOUNTER — Ambulatory Visit: Payer: BLUE CROSS/BLUE SHIELD | Admitting: Rehabilitative and Restorative Service Providers"

## 2021-10-31 ENCOUNTER — Other Ambulatory Visit: Payer: Self-pay

## 2021-10-31 DIAGNOSIS — M1711 Unilateral primary osteoarthritis, right knee: Secondary | ICD-10-CM

## 2021-10-31 NOTE — Progress Notes (Signed)
[facility] Orthopedic Sports/Spine Rehabilitation  PT Treatment Note    Today's Date: 10/31/2021    Name: Lance Peters  DOB: 11/13/1960  Referring Physician: Rolene Arbour, PA  Diagnosis:   1. Primary osteoarthritis of right knee            Visit #: 13    Subjective:  Pain Assessment: 3  Pt reports an improvement in pain intensity and pain frequency since previous treatment session.  Patient is doing pretty well coming in today, no issues or concerns since last session, notes that the knee does not ache the way that it always used to.       Objective:  ROM -  Right, Knee,    KNEE LEFT RIGHT STRENGTH    PROM AROM PROM AROM Left Right   Flexion   130  NA NA   Extension   Lack 3  NA NA            Quad Isometric (Quad set)     Good Fair   SLR     Good Fair     Strength - Therapeutic Exercises per flowsheet  Function: - Improved- see above, able to a lot of beach walking  Education:  Updated HEP, Verbal cues for ther ex, Joint ed concepts    Objective       DOS:    WEIGHTBEARING STATUS as tolerated       Bike  5 min    Heel slide 5sx10   Heel prop 5 min   Prone hang 5 min   QS on towel 5sx20   SLR x20   Standing TKE Gr 3sx30   Standing DL calf raise P29   Clock step x5 B   Mini squat x30    Slider P, L x10    Modified step up 6in x20   Step dn 4in x20        Treatment:  Cold pack, Ther Exercise per flowsheet    Assessment:   Patient is doing well post operatively, making gains in his strengthening p[rogram with no issues.        Plan of Care:  Continue per Plan of care -  As written; Patient would benefit from skilled rehabilitation services to address the above impairments to restore functional capacity.    Thank you for referring this patient to [facility] of [facility] Orthopaedics - Sports and Spine Rehabilitation    Rebeca Allegra, PT       Minutes   Time Based CPT  Physical Performance test, Therapeutic Exercise, Therapeutic Activities, NM Re-Education, Manual Therapy, Gait Training, Massage, Aquatic  Therapy, Canalith Repositioning, Iontophoresis, Ultrasound, Orthotic fitting/training, Prosthetic fitting 40   Service Based (untimed) CPT  PT/OT evaluation, PT/OT re-eval, E-stim-unattended, Mechanical traction, Vasopneumatic device    Unbilled time (rest, etc)        Total Treatment Time 40

## 2021-11-04 ENCOUNTER — Other Ambulatory Visit: Payer: Self-pay

## 2021-11-04 ENCOUNTER — Ambulatory Visit: Payer: BLUE CROSS/BLUE SHIELD | Admitting: Rehabilitative and Restorative Service Providers"

## 2021-11-04 DIAGNOSIS — M1711 Unilateral primary osteoarthritis, right knee: Secondary | ICD-10-CM

## 2021-11-04 NOTE — Progress Notes (Signed)
[facility] Orthopedic Sports/Spine Rehabilitation  PT Treatment Note    Today's Date: 11/04/2021    Name: Lance Peters  DOB: 05-Jan-1961  Referring Physician: Rolene Arbour, PA  Diagnosis:   1. Primary osteoarthritis of right knee            Visit #: 14    Subjective:  Pain Assessment: 3  Pt reports an improvement in pain intensity and pain frequency since previous treatment session.  Patient is doing pretty well coming in today, he notes that when he is trying to carry something and go up or down the stairs it is really challenging     Objective:  ROM -  Right, Knee,    KNEE LEFT RIGHT STRENGTH    PROM AROM PROM AROM Left Right   Flexion   130  NA NA   Extension   Lack 3  NA NA            Quad Isometric (Quad set)     Good Fair   SLR     Good Fair     Strength - Therapeutic Exercises per flowsheet  Function: - Improved- see above, able to a lot of beach walking  Education:  Updated HEP, Verbal cues for ther ex, Joint ed concepts    Objective       DOS:    WEIGHTBEARING STATUS as tolerated       Bike  5 min    Heel slide 5sx10   Heel prop 5 min   Prone hang 5 min   QS on towel 5sx20   SLR x20   Standing TKE Gr 3sx30   Standing DL calf raise Z61 0-9# DB   Clock step x5 B 2-5# DB   Mini squat x30 2-5# DB   Slider P, L x10    Modified step up 6in x20   Step dn 4in x20        Treatment:  Cold pack, Ther Exercise per flowsheet    Assessment:   Patient is doing well post operatively, making gains in his strengthening program with no issues. Added in weights today to continue to work back towards his needs of going up and down ladders carrying things for work.        Plan of Care:  Continue per Plan of care -  As written; Patient would benefit from skilled rehabilitation services to address the above impairments to restore functional capacity.    Thank you for referring this patient to [facility] of [facility] Orthopaedics - Sports and Spine Rehabilitation    Rebeca Allegra, PT       Minutes   Time Based  CPT  Physical Performance test, Therapeutic Exercise, Therapeutic Activities, NM Re-Education, Manual Therapy, Gait Training, Massage, Aquatic Therapy, Canalith Repositioning, Iontophoresis, Ultrasound, Orthotic fitting/training, Prosthetic fitting 40   Service Based (untimed) CPT  PT/OT evaluation, PT/OT re-eval, E-stim-unattended, Mechanical traction, Vasopneumatic device    Unbilled time (rest, etc)        Total Treatment Time 40

## 2021-11-05 NOTE — Progress Notes (Signed)
[facility] Orthopedic Sports/Spine Rehabilitation  PT Treatment Note    Today's Date: 11/07/2021    Name: Lance Peters  DOB: [birth year]  Referring Physician: Rolene Arbour, PA  Diagnosis:   1. Primary osteoarthritis of right knee            Visit #: 15    Subjective:  Pain Assessment: 2  Pt reports an improvement in pain intensity and pain frequency since previous treatment session.  Patient is doing pretty well coming in today, he notes that it will be sore at the end of a long day after being on his feet a lot.     Objective:  ROM -  Right, Knee,    KNEE LEFT RIGHT STRENGTH    PROM AROM PROM AROM Left Right   Flexion   130  NA NA   Extension   Lack 3  NA NA            Quad Isometric (Quad set)     Good Fair   SLR     Good Fair     Strength - Therapeutic Exercises per flowsheet  Function: - Improved- see above, has been able to be on his feet more.   Education:  Updated HEP, Verbal cues for ther ex, Joint ed concepts    Objective       DOS:    WEIGHTBEARING STATUS as tolerated       Bike  5 min    Heel slide 5sx10   Heel prop 5 min   Prone hang 5 min   QS on towel 5sx20   SLR x20   Standing TKE Gr 3sx30   Standing DL calf raise Z61 0-9# DB   Clock step x5 B 2-7# DB   Mini squat x30 2-7# DB   Slider P, L x10    Modified step up 6in x20   Step dn 4in x20    PRE leg press SL 3x10 60#               Treatment:  Cold pack, Ther Exercise per flowsheet    Assessment:   Patient is doing well post operatively, making gains in his strengthening program with no issues. Able to increase weights with closed chain work and add in the leg press with no issues.        Plan of Care:  Continue per Plan of care -  As written; Patient would benefit from skilled rehabilitation services to address the above impairments to restore functional capacity.    Thank you for referring this patient to [facility] of [facility] Orthopaedics - Sports and Spine Rehabilitation    Rebeca Allegra, PT       Minutes   Time Based CPT  Physical  Performance test, Therapeutic Exercise, Therapeutic Activities, NM Re-Education, Manual Therapy, Gait Training, Massage, Aquatic Therapy, Canalith Repositioning, Iontophoresis, Ultrasound, Orthotic fitting/training, Prosthetic fitting 40   Service Based (untimed) CPT  PT/OT evaluation, PT/OT re-eval, E-stim-unattended, Mechanical traction, Vasopneumatic device    Unbilled time (rest, etc)        Total Treatment Time 40

## 2021-11-07 ENCOUNTER — Ambulatory Visit
Payer: BLUE CROSS/BLUE SHIELD | Attending: Orthopedic Surgery | Admitting: Rehabilitative and Restorative Service Providers"

## 2021-11-07 ENCOUNTER — Other Ambulatory Visit: Payer: Self-pay

## 2021-11-07 DIAGNOSIS — M1711 Unilateral primary osteoarthritis, right knee: Secondary | ICD-10-CM | POA: Insufficient documentation

## 2021-11-11 ENCOUNTER — Other Ambulatory Visit: Payer: Self-pay

## 2021-11-11 NOTE — Telephone Encounter (Signed)
I have just refilled a prescription for chronic med(s), havent seen patient recently. Please schedule appt for followup. With fasting blood work,

## 2021-11-11 NOTE — Telephone Encounter (Signed)
LOV  3.23.23  NOV - none -  LABS  12.15.22

## 2021-11-12 ENCOUNTER — Ambulatory Visit: Payer: BLUE CROSS/BLUE SHIELD | Admitting: Rehabilitative and Restorative Service Providers"

## 2021-11-12 ENCOUNTER — Other Ambulatory Visit: Payer: Self-pay

## 2021-11-12 DIAGNOSIS — M1711 Unilateral primary osteoarthritis, right knee: Secondary | ICD-10-CM

## 2021-11-12 NOTE — Progress Notes (Signed)
[facility] Orthopedic Sports/Spine Rehabilitation  PT Treatment Note    Today's Date: 11/12/2021    Name: Gordon Trisler  DOB: [birth year]  Referring Physician: Rolene Arbour, PA  Diagnosis:   1. Primary osteoarthritis of right knee            Visit #: 16    Subjective:  Pain Assessment: 3  Pt reports no significant changes in pain or function since previous treatment session.  Patient is doing pretty well coming in today, he notes that it will be sore at the end of a long day after being on his feet a lot. It will still be stiff if he does not move around for a little while. He is set to go back to work in 19 days.      Objective:  ROM -  Right, Knee,    KNEE LEFT RIGHT STRENGTH    PROM AROM PROM AROM Left Right   Flexion   128  NA NA   Extension   Lack 3  NA NA            Quad Isometric (Quad set)     Good Fair   SLR     Good Fair     Strength - Therapeutic Exercises per flowsheet  Function: - Improved- see above, has been able to be on his feet more.   Education:  Updated HEP, Verbal cues for ther ex, Joint ed concepts    Objective       DOS:    WEIGHTBEARING STATUS as tolerated       Bike  5 min    Heel slide 5sx10   Heel prop 5 min   Prone hang 5 min   QS on towel 5sx20   SLR x20   Standing TKE Gr 3sx30   Standing DL calf raise Z61 0-9# DB   Clock step x5 B 2-7# DB   Mini squat x30 2-7# DB   Slider P, L x10    Modified step up 6in x20   Step dn 4in x20    PRE leg press SL 3x10 70#               Treatment:  Cold pack, Ther Exercise per flowsheet    Assessment:   Patient is doing well post operatively, making gains in his strengthening program with no issues. Able to increase weights with closed chain work and add in the leg press with no issues.        Plan of Care:  Continue per Plan of care -  As written; Patient would benefit from skilled rehabilitation services to address the above impairments to restore functional capacity.    Thank you for referring this patient to [facility] of [facility]  Orthopaedics - Sports and Spine Rehabilitation    Rebeca Allegra, PT       Minutes   Time Based CPT  Physical Performance test, Therapeutic Exercise, Therapeutic Activities, NM Re-Education, Manual Therapy, Gait Training, Massage, Aquatic Therapy, Canalith Repositioning, Iontophoresis, Ultrasound, Orthotic fitting/training, Prosthetic fitting 40   Service Based (untimed) CPT  PT/OT evaluation, PT/OT re-eval, E-stim-unattended, Mechanical traction, Vasopneumatic device    Unbilled time (rest, etc)        Total Treatment Time 40

## 2021-11-12 NOTE — Telephone Encounter (Signed)
Pt. Made aware and scheduled for 11/19/21.

## 2021-11-12 NOTE — Progress Notes (Signed)
[facility] Orthopedic Sports/Spine Rehabilitation  PT Treatment Note    Today?s Date: 11/14/2021    Name: Lance Peters  DOB: June 18, 1960  Referring Physician: Rolene Arbour, PA  Diagnosis:   1. Primary osteoarthritis of right knee            Visit #: 14    Subjective:  Pain Assessment: 3  Pt reports no significant changes in pain or function since previous treatment session.  Patient is doing pretty well coming in today, he notes that it will be sore at the end of a long day after being on his feet a lot. It will still be stiff if he does not move around for a little while. He is set to go back to work in 17 days.      Objective:  ROM -  Right, Knee,  Unchanged  KNEE LEFT RIGHT STRENGTH    PROM AROM PROM AROM Left Right   Flexion   128  NA NA   Extension   Lack 3  NA NA            Quad Isometric (Quad set)     Good Fair   SLR     Good Fair     Strength - Therapeutic Exercises per flowsheet  Function: - Improved- see above, has been able to be on his feet more.   Education:  Updated HEP, Verbal cues for ther ex, Joint ed concepts    Objective       DOS:    WEIGHTBEARING STATUS as tolerated       Bike  5 min    Heel slide 5sx10   Heel prop 5 min   Prone hang 5 min   QS on towel 5sx20   SLR x20   Standing TKE Gr 3sx30   Standing DL calf raise V40 9-8# DB   Clock step x5 B 2-9# DB   Mini squat x30 2-9# DB   Slider P, L x10    Modified step up 6in x20   Step dn 4in x20    PRE leg press SL 3x10 75#               Treatment:  Cold pack, Ther Exercise per flowsheet    Assessment:   Patient is doing well post operatively, making gains in his strengthening program with no issues. Able to increase weights with closed chain work and add in the leg press with no issues.        Plan of Care:  Continue per Plan of care -  As written; Patient would benefit from skilled rehabilitation services to address the above impairments to restore functional capacity.    Thank you for referring this patient to [facility] of [facility]  Orthopaedics - Sports and Spine Rehabilitation    Rebeca Allegra, PT       Minutes   Time Based CPT  Physical Performance test, Therapeutic Exercise, Therapeutic Activities, NM Re-Education, Manual Therapy, Gait Training, Massage, Aquatic Therapy, Canalith Repositioning, Iontophoresis, Ultrasound, Orthotic fitting/training, Prosthetic fitting 40   Service Based (untimed) CPT  PT/OT evaluation, PT/OT re-eval, E-stim-unattended, Mechanical traction, Vasopneumatic device    Unbilled time (rest, etc)        Total Treatment Time 40

## 2021-11-14 ENCOUNTER — Other Ambulatory Visit: Payer: Self-pay

## 2021-11-14 ENCOUNTER — Ambulatory Visit: Payer: BLUE CROSS/BLUE SHIELD | Admitting: Rehabilitative and Restorative Service Providers"

## 2021-11-14 ENCOUNTER — Other Ambulatory Visit
Admission: RE | Admit: 2021-11-14 | Discharge: 2021-11-14 | Disposition: A | Discharge status: Home / Self Care | Payer: BLUE CROSS/BLUE SHIELD | Source: Ambulatory Visit | Attending: Primary Care | Admitting: Primary Care

## 2021-11-14 DIAGNOSIS — R7301 Impaired fasting glucose: Secondary | ICD-10-CM | POA: Insufficient documentation

## 2021-11-14 DIAGNOSIS — M1711 Unilateral primary osteoarthritis, right knee: Secondary | ICD-10-CM

## 2021-11-14 DIAGNOSIS — I10 Essential (primary) hypertension: Secondary | ICD-10-CM | POA: Insufficient documentation

## 2021-11-14 LAB — BASIC METABOLIC PANEL
Anion Gap: 11 (ref 7–16)
CO2: 25 mmol/L (ref 20–28)
Calcium: 9.8 mg/dL (ref 8.6–10.2)
Chloride: 104 mmol/L (ref 96–108)
Creatinine: 0.93 mg/dL (ref 0.67–1.17)
Glucose: 118 mg/dL — ABNORMAL HIGH (ref 60–99)
Lab: 17 mg/dL (ref 6–20)
Potassium: 4.7 mmol/L (ref 3.3–5.1)
Sodium: 140 mmol/L (ref 133–145)
eGFR BY CREAT: 93 *

## 2021-11-14 LAB — HEMOGLOBIN A1C: Hemoglobin A1C: 6.1 % — ABNORMAL HIGH

## 2021-11-18 ENCOUNTER — Other Ambulatory Visit: Payer: Self-pay

## 2021-11-18 ENCOUNTER — Ambulatory Visit: Payer: BLUE CROSS/BLUE SHIELD | Admitting: Rehabilitative and Restorative Service Providers"

## 2021-11-18 DIAGNOSIS — M1711 Unilateral primary osteoarthritis, right knee: Secondary | ICD-10-CM

## 2021-11-18 NOTE — Progress Notes (Signed)
[facility] Orthopedic Sports/Spine Rehabilitation  PT Treatment Note    Today?s Date: 11/18/2021    Name: Lance Peters  DOB: 08/04/1960  Referring Physician: Rolene Arbour, PA  Diagnosis:   1. Primary osteoarthritis of right knee            Visit #: 17    Subjective:  Pain Assessment: 3  Pt reports no significant changes in pain or function since previous treatment session.  Patient is doing pretty well coming in today, he notes that at the end of the day it will sometimes make a clicking or a catching sound.      Objective:  ROM -  Right, Knee,  Unchanged  KNEE LEFT RIGHT STRENGTH    PROM AROM PROM AROM Left Right   Flexion   128  NA NA   Extension   Lack 2  NA NA            Quad Isometric (Quad set)     Good Fair   SLR     Good Fair     Strength - Therapeutic Exercises per flowsheet  Function: - Improved- see above, has been able to be on his feet more.   Education:  Updated HEP, Verbal cues for ther ex, Joint ed concepts    Objective       DOS:    WEIGHTBEARING STATUS as tolerated       Bike  5 min    Heel slide 5sx10   Heel prop 5 min   Prone hang 5 min   QS on towel 5sx20   SLR x20   Standing TKE Gr 3sx30   Standing DL calf raise Z61 0-96# DB   Clock step x5 B 2-10# DB   Mini squat x30 2-10# DB   Slider P, L x10    Modified step up 6in x20   Step dn 4in x20    PRE leg press SL 3x10 75#               Treatment:  Cold pack, Ther Exercise per flowsheet    Assessment:   Patient is doing well post operatively, making gains in his strengthening program with no issues. Able to increase weights with closed chain work and add in the leg press with no issues.        Plan of Care:  Continue per Plan of care -  As written; Patient would benefit from skilled rehabilitation services to address the above impairments to restore functional capacity.    Thank you for referring this patient to [facility] of [facility] Orthopaedics - Sports and Spine Rehabilitation    Rebeca Allegra, PT       Minutes   Time Based  CPT  Physical Performance test, Therapeutic Exercise, Therapeutic Activities, NM Re-Education, Manual Therapy, Gait Training, Massage, Aquatic Therapy, Canalith Repositioning, Iontophoresis, Ultrasound, Orthotic fitting/training, Prosthetic fitting 40   Service Based (untimed) CPT  PT/OT evaluation, PT/OT re-eval, E-stim-unattended, Mechanical traction, Vasopneumatic device    Unbilled time (rest, etc)        Total Treatment Time 40

## 2021-11-19 ENCOUNTER — Other Ambulatory Visit: Payer: Self-pay

## 2021-11-19 ENCOUNTER — Encounter: Payer: Self-pay | Admitting: Primary Care

## 2021-11-19 ENCOUNTER — Ambulatory Visit: Payer: BLUE CROSS/BLUE SHIELD | Admitting: Primary Care

## 2021-11-19 VITALS — BP 126/80 | HR 60 | Temp 97.8°F | Wt 184.1 lb

## 2021-11-19 DIAGNOSIS — Z0000 Encounter for general adult medical examination without abnormal findings: Secondary | ICD-10-CM

## 2021-11-19 DIAGNOSIS — Z125 Encounter for screening for malignant neoplasm of prostate: Secondary | ICD-10-CM

## 2021-11-19 DIAGNOSIS — I10 Essential (primary) hypertension: Secondary | ICD-10-CM

## 2021-11-19 DIAGNOSIS — R7301 Impaired fasting glucose: Secondary | ICD-10-CM

## 2021-11-19 NOTE — Progress Notes (Signed)
Lance Peters is a 61 y.o. male   11/19/2021    Chief Complaint   Patient presents with   ? Hypertension       HPI:  Lance Peters arrived for an acute appointment.    Routine follow-up.  Treated for hypertension, blood pressure medicines as noted.  He is taking his medicines regularly, not missing any doses.  No side effects.    Also followed for hyperlipidemia and impaired fasting glucose.    He had knee arthroplasty recently, surgery went pretty well.  No complications.  He continues with PT.  Things are getting better.  Planning to return to work next month.  Following with orthopedics per their recommendations.      Patient Active Problem List   Diagnosis Code   ? Knee pain M25.569   ? Shoulder pain M25.519   ? Impaired fasting glucose R73.01   ? Chest pain R07.9   ? Essential hypertension I10   ? Hyperlipidemia E78.5   ? Arthritis M19.90   ? Depression F32.A   ? Primary osteoarthritis of both knees, patellar>medial M17.0   ? Bilateral carpal tunnel syndrome G56.03   ? Ulnar neuropathy of right upper extremity G56.21   ? 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 s/p RIght TKA 07/18/21 M17.11       Allergies: Wasp venom, Penicillins, and Synvisc [hylan g-f 20]    Medication list reviewed, changes made  Current Outpatient Medications   Medication Sig   ? amLODIPine (NORVASC) 10 mg tablet TAKE 1 TABLET BY MOUTH EVERY DAY   ? atorvastatin (LIPITOR) 20 mg tablet TAKE 1 TABLET BY MOUTH EVERY DAY WITH DINNER   ? sertraline (ZOLOFT) 50 mg tablet TAKE 1 TABLET BY MOUTH EVERY DAY   ? benazepril (LOTENSIN) 10 mg tablet Take 1 tablet (10 mg total) by mouth daily   ? aspirin 81 mg EC tablet Take 1 tablet (81 mg total) by mouth daily  Please do not continue your previous aspirin therapy until you have completed the full duration of DVT prevention medication. At that time you may return to your previous dose and frequency (number of times a day).   ? DULoxetine (CYMBALTA) 30 mg DR capsule TAKE 1 CAPSULE BY MOUTH EVERY DAY   ?  oxyCODONE (ROXICODONE) 5 mg immediate release tablet Take 1-2 tablets (5-10 mg total) by mouth every 4-6 hours as needed for Pain  for Pain Following an Operation Max daily dose: 60 mg   ? acetaminophen (TYLENOL) 500 mg tablet Take 2 tablets (1,000 mg total) by mouth 3 times daily  for Pain For pain control. Do not exceed more than 3,000mg  daily   ? EPINEPHrine 0.3 mg/0.3 mL auto-injector Inject 0.3 mLs (0.3 mg total) into the muscle as needed for Anaphylaxis (Patient taking differently: Inject 0.3 mLs (0.3 mg total) into the muscle as needed for Anaphylaxis (Bee and wasp venom))     No current facility-administered medications for this visit.        Allergy list reviewed  Problem list reviewed    OBJECTIVE:  Vitals:    11/19/21 1105   BP: 126/80   Pulse: 60   Temp: 36.6 ?C (97.8 ?F)   Weight: 83.5 kg (184 lb 1.6 oz)       GENERAL:  Alert, no distress  HEENT: No ocular discharge.     LUNGS:  Normal respiratory effort, Clear to auscultation bilaterally, without wheezes or crackles  CV: Regular rate and rhythm, S1 and S2 are  normal, without murmurs rubs or gallops.  Carotid pulses are normal, without bruit      Recent Results (from the past 336 hour(s))   Hemoglobin A1c    Collection Time: 11/14/21  9:20 AM   Result Value Ref Range    Hemoglobin A1C 6.1 (H) %   Basic metabolic panel    Collection Time: 11/14/21  9:20 AM   Result Value Ref Range    Glucose 118 (H) 60 - 99 mg/dL    Sodium 161 096 - 045 mmol/L    Potassium 4.7 3.3 - 5.1 mmol/L    Chloride 104 96 - 108 mmol/L    CO2 25 20 - 28 mmol/L    Anion Gap 11 7 - 16    UN 17 6 - 20 mg/dL    Creatinine 4.09 8.11 - 1.17 mg/dL    eGFR BY CREAT 93 *    Calcium 9.8 8.6 - 10.2 mg/dL        ASSESSMENT/PLAN:  Hypertension-blood pressure is on target, recommend continuing current medications unchanged.  Recommend consideration of a whole food plant-based diet, or at least increasing minimally processed foods and produce in the current diet.    Hyperlipidemia-continue  medicines, see above regarding dietary discussion    Impaired fasting glucose-no need for medication now, we will continue to monitor, see above regarding diet discussion.    Planning return in about 1 year for physical, sooner if needed for acute concerns

## 2021-11-19 NOTE — Patient Instructions (Signed)
I recommend you consider a plant-based, whole food diet, to optimize blood pressure, cholesterol levels, blood sugar metabolism, body weight etc.  Consider participating in the 15-day jumpstart program, that is mentioned on the following website.  If you wish to participate in that program, let my office know and I can place a referral for you, that will get you a discount on the registration cost.    [email or web address]

## 2021-11-21 ENCOUNTER — Ambulatory Visit: Payer: BLUE CROSS/BLUE SHIELD | Admitting: Rehabilitative and Restorative Service Providers"

## 2021-11-25 ENCOUNTER — Other Ambulatory Visit: Payer: Self-pay

## 2021-11-25 ENCOUNTER — Ambulatory Visit: Payer: BLUE CROSS/BLUE SHIELD | Admitting: Rehabilitative and Restorative Service Providers"

## 2021-11-25 DIAGNOSIS — M1711 Unilateral primary osteoarthritis, right knee: Secondary | ICD-10-CM

## 2021-11-25 NOTE — Progress Notes (Signed)
[facility] Orthopedic Sports/Spine Rehabilitation  PT Treatment Note    Today?s Date: 11/25/2021    Name: Lance Peters  DOB: Aug 21, 1960  Referring Physician: Rolene Arbour, PA  Diagnosis:   1. Primary osteoarthritis of right knee            Visit #: 18    Subjective:  Pain Assessment: 3  Pt reports no significant changes in pain or function since previous treatment session.  Patient is doing pretty well coming in today, he notes that he has been doing stairs normally now and will have some discomfort more in the front of his thigh.      Objective:  ROM -  Right, Knee,  Unchanged  KNEE LEFT RIGHT STRENGTH    PROM AROM PROM AROM Left Right   Flexion   130  NA NA   Extension   Lack 2  NA NA            Quad Isometric (Quad set)     Good Fair   SLR     Good Fair     Strength - Therapeutic Exercises per flowsheet  Function: - Improved- see above, has been able to be on his feet more.   Education:  Updated HEP, Verbal cues for ther ex, Joint ed concepts    Objective       DOS:    WEIGHTBEARING STATUS as tolerated       Bike  5 min    Prone knee flexion 10sx10   Heel slide 5sx10   Heel prop 5 min   Prone hang 5 min   QS on towel 5sx20   SLR x20   Standing TKE Gr 3sx30   Standing DL calf raise Z61 0-96# DB   Clock step x5 B 2-10# DB   Mini squat x30 2-10# DB   Slider P, L x10    Modified step up 6in x20   Step dn 4in x20    PRE leg press SL 3x10 80#               Treatment:  Cold pack, Ther Exercise per flowsheet    Assessment:   Patient continues to be doing well post operatively, progressed his program as documented above with no issues. Reviewed modifications to his HEP and expectations going back to work and he understands them.        Plan of Care:  Continue per Plan of care -  As written; Patient would benefit from skilled rehabilitation services to address the above impairments to restore functional capacity.    Thank you for referring this patient to [facility] of [facility] Orthopaedics - Sports and Spine  Rehabilitation    Rebeca Allegra, PT       Minutes   Time Based CPT  Physical Performance test, Therapeutic Exercise, Therapeutic Activities, NM Re-Education, Manual Therapy, Gait Training, Massage, Aquatic Therapy, Canalith Repositioning, Iontophoresis, Ultrasound, Orthotic fitting/training, Prosthetic fitting 40   Service Based (untimed) CPT  PT/OT evaluation, PT/OT re-eval, E-stim-unattended, Mechanical traction, Vasopneumatic device    Unbilled time (rest, etc)        Total Treatment Time 40

## 2021-11-28 ENCOUNTER — Other Ambulatory Visit: Payer: Self-pay

## 2021-11-28 ENCOUNTER — Ambulatory Visit: Payer: BLUE CROSS/BLUE SHIELD | Admitting: Rehabilitative and Restorative Service Providers"

## 2021-11-28 DIAGNOSIS — M1711 Unilateral primary osteoarthritis, right knee: Secondary | ICD-10-CM

## 2021-11-28 NOTE — Progress Notes (Signed)
[facility] Orthopedic Sports/Spine Rehabilitation  PT Treatment Note    Today?s Date: 11/28/2021    Name: Lance Peters  DOB: [birth year]  Referring Physician: Rolene Arbour, PA  Diagnosis:   1. Primary osteoarthritis of right knee            Visit #: 19    Subjective:  Pain Assessment: 3  Pt reports no significant changes in pain or function since previous treatment session.  Patient is doing pretty well coming in today, he is returning to work next week.      Objective:  ROM -  Right, Knee,  Unchanged  KNEE LEFT RIGHT STRENGTH    PROM AROM PROM AROM Left Right   Flexion   130  NA NA   Extension   Lack 2  NA NA            Quad Isometric (Quad set)     Good Fair   SLR     Good Fair     Strength - Therapeutic Exercises per flowsheet  Function: - Improved- is set to go back to work   Education:  Updated HEP, Verbal cues for ther ex, Joint ed concepts    Objective       DOS:    WEIGHTBEARING STATUS as tolerated       Bike  5 min    Prone knee flexion 10sx10   Heel slide 5sx10   Heel prop 5 min   Prone hang 5 min   QS on towel 5sx20   SLR x20   Standing TKE Gr 3sx30   Standing DL calf raise B14 7-82# DB   Clock step x5 B 2-10# DB   Mini squat x30 2-10# DB   Slider P, L x10    Modified step up 6in x20   Step dn 4in x20    PRE leg press SL 3x10 85#               Treatment:  Cold pack, Ther Exercise per flowsheet    Assessment:   Patient continues to be doing well post operatively, progressed his program as documented above with no issues. Reviewed modifications to his HEP and expectations going back to work and he understands them. He will contact PT if any further follow up is needed       Plan of Care:  Continue per Plan of care -  As written; Patient would benefit from skilled rehabilitation services to address the above impairments to restore functional capacity.    Thank you for referring this patient to [facility] of [facility] Orthopaedics - Sports and Spine Rehabilitation    Rebeca Allegra, PT        Minutes   Time Based CPT  Physical Performance test, Therapeutic Exercise, Therapeutic Activities, NM Re-Education, Manual Therapy, Gait Training, Massage, Aquatic Therapy, Canalith Repositioning, Iontophoresis, Ultrasound, Orthotic fitting/training, Prosthetic fitting 40   Service Based (untimed) CPT  PT/OT evaluation, PT/OT re-eval, E-stim-unattended, Mechanical traction, Vasopneumatic device    Unbilled time (rest, etc)        Total Treatment Time 40

## 2021-12-08 ENCOUNTER — Encounter: Payer: Self-pay | Admitting: Student in an Organized Health Care Education/Training Program

## 2021-12-08 ENCOUNTER — Emergency Department
Admission: EM | Admit: 2021-12-08 | Discharge: 2021-12-08 | Disposition: A | Discharge status: Home / Self Care | Payer: BLUE CROSS/BLUE SHIELD | Source: Ambulatory Visit | Attending: Student in an Organized Health Care Education/Training Program | Admitting: Student in an Organized Health Care Education/Training Program

## 2021-12-08 DIAGNOSIS — K0889 Other specified disorders of teeth and supporting structures: Secondary | ICD-10-CM | POA: Insufficient documentation

## 2021-12-08 MED ORDER — OXYCODONE HCL 5 MG PO TABS *I*
5.0000 mg | ORAL_TABLET | Freq: Four times a day (QID) | ORAL | 0 refills | Status: DC | PRN
Start: 2021-12-08 — End: 2022-01-07

## 2021-12-08 MED ORDER — PENICILLIN V POTASSIUM 500 MG PO TABS *I*
500.0000 mg | ORAL_TABLET | Freq: Four times a day (QID) | ORAL | 0 refills | Status: AC
Start: 2021-12-08 — End: 2021-12-18

## 2021-12-08 MED ORDER — PENICILLIN V POTASSIUM 500 MG PO TABS *I*
500.0000 mg | ORAL_TABLET | Freq: Once | ORAL | Status: AC
Start: 2021-12-08 — End: 2021-12-08
  Administered 2021-12-08: 500 mg via ORAL
  Filled 2021-12-08: qty 1

## 2021-12-08 NOTE — ED Provider Notes (Signed)
History     Chief Complaint   Patient presents with   ? Dental Pain     Lance Peters is a 61 y.o. male who presents to the emergency department with dental pain.  Patient reports that he has had dental pain for a while however it got much worse last couple days.  Reports is in the right upper.  Notes he cannot get into see his dentist till October.  Is been trying acetaminophen, ibuprofen.  Denies trauma, fever, chills, nausea, vomiting.        History provided by:  Patient  Language interpreter used: No          Medical/Surgical/Family History     Past Medical History:   Diagnosis Date   ? Arthritis     elbows and shoulders    ? Chest pain, unspecified    ? Depression 05/23/2014   ? Neuromuscular disorder     elbows, shoulders   ? Osteoarthritis of both knees    ? Unspecified essential hypertension 06/02/2013        Patient Active Problem List   Diagnosis Code   ? Knee pain M25.569   ? Shoulder pain M25.519   ? Impaired fasting glucose R73.01   ? Chest pain R07.9   ? Essential hypertension I10   ? Hyperlipidemia E78.5   ? Arthritis M19.90   ? Depression F32.A   ? Primary osteoarthritis of both knees, patellar>medial M17.0   ? Bilateral carpal tunnel syndrome G56.03   ? Ulnar neuropathy of right upper extremity G56.21   ? 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 s/p RIght TKA 07/18/21 M17.11            Past Surgical History:   Procedure Laterality Date   ? carpel tunnel surgery Left    ? HAND SURGERY      trigger finger release    ? PR ARTHRP KNE CONDYLE&PLATU MEDIAL&LAT COMPARTMENTS Right 07/18/2021    Procedure: RIGHT ARTHROPLASTY, KNEE, TOTAL;  Surgeon: Laurance Flatten, MD;  Location: [facility];  Service: Orthopedics   ? PR ARTHRS KNE SURG W/MENISCECTOMY MED/LAT W/SHVG Right 07/16/2016    Procedure: KNEE ARTHROSCOPY WITH PARTIAL MEDIAL MENISCECTOMY;  Surgeon: Norina Buzzard, MD;  Location: [facility];  Service: Orthopedics   ? PR COLONOSCOPY FLX DX W/COLLJ SPEC WHEN PFRMD N/A 04/30/2020    Procedure:  COLONOSCOPY;  Surgeon: Quitman Livings, MD;  Location: [facility];  Service: GI     Family History   Problem Relation Age of Onset   ? Colon polyps Mother    ? Cancer Mother    ? Colon cancer Neg Hx    ? Anesthesia problems Neg Hx           Social History     Tobacco Use   ? Smoking status: Never   ? Smokeless tobacco: Never   Substance Use Topics   ? Alcohol use: No     Alcohol/week: 1.0 standard drink of alcohol     Types: 1 Cans of beer per week     Comment: abstinent since april 2018   ? Drug use: No     Living Situation     Questions Responses    Patient lives with     Homeless     Caregiver for other family member     External Services     Employment     Domestic Violence Risk  Review of Systems   Review of Systems    Physical Exam     Triage Vitals  Triage Start: Start, (12/08/21 1954)  First Recorded BP: (!) 179/97, Resp: 20, Temp: 36.1 ?C (97 ?F), Temp src: TEMPORAL Oxygen Therapy SpO2: 100 %, Oximetry Source: Lt Hand, O2 Device: None (Room air), Heart Rate: 82, (12/08/21 1953)  .      Physical Exam  Vitals and nursing note reviewed.   Constitutional:       General: He is not in acute distress.     Appearance: Normal appearance. He is well-developed. He is not diaphoretic.   HENT:      Head: Normocephalic and atraumatic.      Nose: Nose normal.      Mouth/Throat:      Mouth: Mucous membranes are moist.      Pharynx: No oropharyngeal exudate.      Comments: Poor dentition throughout, numerous caps, no trismus, floor the mouth is soft, small amount of tenderness in the right upper, small amount of gingival irritation, no abscess  Eyes:      Extraocular Movements: Extraocular movements intact.      Pupils: Pupils are equal, round, and reactive to light.   Cardiovascular:      Rate and Rhythm: Normal rate and regular rhythm.      Pulses: Normal pulses.      Heart sounds: Normal heart sounds. No murmur heard.     No friction rub. No gallop.   Pulmonary:      Effort: Pulmonary effort is  normal. No respiratory distress.      Breath sounds: Normal breath sounds. No wheezing or rales.   Musculoskeletal:         General: No deformity. Normal range of motion.      Cervical back: Normal range of motion and neck supple.   Skin:     General: Skin is warm and dry.   Neurological:      General: No focal deficit present.      Mental Status: He is alert and oriented to person, place, and time. Mental status is at baseline.   Psychiatric:         Mood and Affect: Mood normal.         Behavior: Behavior normal.         Thought Content: Thought content normal.         Judgment: Judgment normal.         Medical Decision Making   Patient seen by me on:  12/08/2021    Assessment:  Lance Peters is a 61 y.o. male presents emergency department with dental pain.  On arrival patient is overall well-appearing.  His exam is notable for some gingival irritation, no abscess.  Provide oral antibiotics, discussed outpatient follow-up as well as oral analgesia.  Expressed understanding.  He does note he had an allergy to penicillin when he was 6, notes he was hospitalized was given penicillin developed a rash.  I think it is unlikely he has true allergy to penicillins.  He was amenable for trial dose here.  Will give dose and observe.      Differential diagnosis:  Gingivitis, gingival irritation, dental carry    Plan:  Penicillin  Observation with reevaluation           ED Course as of 12/08/21 2334   Wynelle Link Dec 08, 2021   2330 Patient tolerated the penicillin with no rash, abdominal pain, nausea or trouble breathing.  He is appropriate for discharge       Leretha Pol, MD             Kylieann Eagles, Hilton Cork, MD  12/08/21 [phone]

## 2021-12-08 NOTE — Discharge Instructions (Signed)
You were seen in the emergency room for dental pain.  Please continue acetaminophen, ibuprofen and salt water rinses.  Please take the prescribed antibiotic.  Please follow-up with your dentist.  He also take the other prescribed indication for breakthrough pain.  Please return develop worsening pain, inability eat, drink or fever.

## 2021-12-08 NOTE — ED Triage Notes (Signed)
Pt c/o broken R upper molar with pain and swelling x 1 week.  Taking OTC pain meds without relief.        Prehospital medications given: No

## 2022-01-07 ENCOUNTER — Ambulatory Visit: Payer: BLUE CROSS/BLUE SHIELD | Admitting: Orthopedic Surgery

## 2022-01-07 ENCOUNTER — Other Ambulatory Visit: Payer: Self-pay

## 2022-01-07 VITALS — BP 154/94 | HR 64 | Wt 175.0 lb

## 2022-01-07 DIAGNOSIS — M25561 Pain in right knee: Secondary | ICD-10-CM

## 2022-01-07 DIAGNOSIS — Z96651 Presence of right artificial knee joint: Secondary | ICD-10-CM

## 2022-01-07 NOTE — Progress Notes (Signed)
ADULT POST OP TKR    Lance Peters presents today for a post-op visit following aright total knee arthroplasty performed 07/18/2021.  Patient was last evaluated on 09/03/2021.  He is back to working as a Copywriter, advertising for [employer].  He rates his knee pain is a 0 out of 10.  He is able to ambulate up and down stairs on a reciprocal basis.  He does note significant knee pain and function improvement from the preoperative baseline.  He remains compliant with dental antibiotic prophylaxis precautions.  Exam: Well-developed, well-nourished, healthy-appearing 61year old male in no apparent distress.  Mood and affect appropriate.  Sensorium clear; oriented X 3.  Patient seated comfortably in an examining room chair.  Able to arise independently from a seated position.  Normal cadence gait without observable limp.  Right knee exam demonstrates neutral knee alignment.  Active assisted knee range of motion 0 to 120 degrees.  Knee joint stable.  Surgical incision well-healed.  No knee effusion, warmth or redness present.  No lower extremity edema present.  Calf is soft and nontender.  Radiographs were obtained and personally reviewed. These show a total knee arthroplasty with normal alignment.    Impression: Right TKR-07/18/2021.     Plan:   The patient's x-ray findings and underlying diagnosis reviewed.  Patient was congratulated regarding ongoing postoperative improvement.  Importance of continuing dental antibiotic prophylaxis precautions x2 years postop discussed.  Acceptable low-impact exercise activities were once again reviewed.  All ambulatory activities to be performed within the confines of comfort.  Additional questions invited and answered.    Follow up at 1 year postop or sooner as needed.    Orders Next Visit: 3 views right knee x-ray.  Answers submitted by the patient for this visit:  Right knee (Submitted on 01/03/2022)  Chief Complaint: RIGHT KNEE PAIN HPI  What is your goal for today's visit?: Check up 6 mo post op  Was this  the result of an injury?: No  What is your pain level?: 1/10  Please describe the quality of your pain: : discomfort, stiffness  What treatments have you tried for this condition?: surgery  Progression since onset: : rapidly improving  Is this a work related condition? : No  Current work status: : usual activities

## 2022-01-07 NOTE — Patient Instructions (Signed)
VIEW YOUR HEALTH INFORMATION ONLINE TODAY!    Below is your activation code to sign up for MyChart, [facility]'s free website that allows you to view most test results, send and receive messages from your doctor, renew your prescriptions, request an appointment, and much more!    How Do I Sign Up?    Important:  You must have an email address to sign up for MyChart.    Go to [email or web address]  Click on the Sign Up (I have a code) link under "New User?" located in the blue box on the left hand side of the screen.    Enter your MyChart Activation Code: Activation code not generated  Current [place] MyChart Status: Active  Enter your Date of Birth, Zip Code and Phone Number, and click NEXT.  Continue following the instructions to complete your MyChart sign up.    You also may want to write down your Username and Password below:    MY USERNAME:  _______________________________________________    MY PASSWORD: _______________________________________________    Additional Information    If you are not the patient, but you are involved in the health care of this patient, DO NOT USE THIS CODE!  Instead, go to the MyChart website ([email or web address]) and click on "Access for My Kids/Family/Friends" below the username and password fields.    Questions?    Call our MyChart Customer Service Center, 8 a.m. to 5 p.m.   Weekdays: [phone]), [phone]; choose Option 1

## 2022-01-16 ENCOUNTER — Other Ambulatory Visit: Payer: Self-pay | Admitting: Primary Care

## 2022-01-16 NOTE — Telephone Encounter (Signed)
LOV  8.15.23  NOV  8.28.24  LABS  8.10.23

## 2022-02-28 ENCOUNTER — Other Ambulatory Visit: Payer: Self-pay | Admitting: Primary Care

## 2022-05-05 ENCOUNTER — Other Ambulatory Visit: Payer: Self-pay | Admitting: Primary Care

## 2022-07-09 ENCOUNTER — Ambulatory Visit: Payer: BLUE CROSS/BLUE SHIELD | Admitting: Orthopedic Surgery

## 2022-07-11 ENCOUNTER — Other Ambulatory Visit: Payer: Self-pay | Admitting: Primary Care

## 2022-07-11 NOTE — Telephone Encounter (Signed)
NOV 12/03/22  LOV  11/19/21  Labs 11/14/21

## 2022-07-23 ENCOUNTER — Other Ambulatory Visit: Payer: Self-pay

## 2022-07-23 ENCOUNTER — Ambulatory Visit
Admission: RE | Admit: 2022-07-23 | Discharge: 2022-07-23 | Disposition: A | Discharge status: Home / Self Care | Payer: BLUE CROSS/BLUE SHIELD | Source: Ambulatory Visit

## 2022-07-23 ENCOUNTER — Ambulatory Visit: Payer: BLUE CROSS/BLUE SHIELD | Admitting: Orthopedic Surgery

## 2022-07-23 VITALS — BP 138/85 | HR 59 | Ht 69.0 in | Wt 175.0 lb

## 2022-07-23 DIAGNOSIS — M1712 Unilateral primary osteoarthritis, left knee: Secondary | ICD-10-CM | POA: Insufficient documentation

## 2022-07-23 DIAGNOSIS — Z471 Aftercare following joint replacement surgery: Secondary | ICD-10-CM | POA: Insufficient documentation

## 2022-07-23 DIAGNOSIS — Z96651 Presence of right artificial knee joint: Secondary | ICD-10-CM

## 2022-07-25 NOTE — Progress Notes (Signed)
This is a confidential patient report written for the express purpose of professional communication.    Subjective:  Patient DM, one year status post right total knee arthroplasty, presented today with reports of overall wellness. He denied any complaints related to his right knee. However, he described experiencing occasional episodic pain in his left knee.    Objective:  On examination, the patient appeared in no acute distress and is comfortably seated. He displayed a well-healed surgical incision in the right knee. His range of motion for his right knee ranged from 0 to 125 degrees of flexion, and there were no signs of instability. The neurocirculatory examination results were within normal limits.    Imaging:  - Three views of the right knee were personally reviewed, interpreted, and demonstrated a stable right total knee arthroplasty.  - A neutral alignment was observed; there's no evidence of osteolysis or loosening.    Impression:  1. Right total Knee Arthroplasty - post one year    Plan:  The patient is progressing well post knee arthroplasty. The plan for him includes resuming activities as tolerated. Additionally, he will follow up every five years for surveillance radiographs.    Follow-up:  - To be scheduled in five years for surveillance radiographs.

## 2022-08-23 ENCOUNTER — Other Ambulatory Visit: Payer: Self-pay | Admitting: Primary Care

## 2022-09-02 ENCOUNTER — Other Ambulatory Visit: Payer: Self-pay

## 2022-11-14 ENCOUNTER — Other Ambulatory Visit: Payer: Self-pay | Admitting: Primary Care

## 2022-11-14 NOTE — Telephone Encounter (Signed)
Last office visit:   11/19/2021  Last telehome visit:   Visit date not found  Patients upcoming appointments:  Future Appointments   Date Time Provider Department Center   12/03/2022  2:00 PM Guadalupe Maple, MD [facility] None     BP Readings from Last 3 Encounters:   07/23/22 138/85   01/07/22 (!) 154/94   12/08/21 (!) 179/97      Recent Lab results:  GENERAL CHEMISTRY   No value within the past 365 days   LIPID PROFILE   No value within the past 365 days   LIVER PROFILE   No value within the past 365 days   DIABETES THYROID   No value within the past 365 days No value within the past 365 days      Pending/Orders Labs:  Lab Frequency Next Occurrence   Comprehensive metabolic panel Once 10/15/2022   CBC Once 10/15/2022   Lipid Panel (Reflex to Direct  LDL if Triglycerides more than 400) Once 10/15/2022   PSA (eff.07-2008) Once 10/15/2022   Hemoglobin A1c Once 10/25/2022

## 2022-12-03 ENCOUNTER — Encounter: Payer: BLUE CROSS/BLUE SHIELD | Admitting: Primary Care

## 2022-12-09 ENCOUNTER — Other Ambulatory Visit: Payer: Self-pay

## 2022-12-09 ENCOUNTER — Encounter: Payer: Self-pay | Admitting: Orthopedic Surgery

## 2022-12-09 ENCOUNTER — Ambulatory Visit: Payer: BLUE CROSS/BLUE SHIELD | Attending: Orthopedic Surgery | Admitting: Orthopedic Surgery

## 2022-12-09 ENCOUNTER — Ambulatory Visit
Admission: RE | Admit: 2022-12-09 | Discharge: 2022-12-09 | Disposition: A | Discharge status: Home / Self Care | Payer: BLUE CROSS/BLUE SHIELD | Source: Ambulatory Visit

## 2022-12-09 VITALS — BP 139/78 | HR 69 | Ht 69.0 in | Wt 185.0 lb

## 2022-12-09 DIAGNOSIS — M1612 Unilateral primary osteoarthritis, left hip: Secondary | ICD-10-CM

## 2022-12-09 DIAGNOSIS — M25552 Pain in left hip: Secondary | ICD-10-CM

## 2022-12-09 DIAGNOSIS — M7062 Trochanteric bursitis, left hip: Secondary | ICD-10-CM

## 2022-12-10 NOTE — Progress Notes (Signed)
Subjective:  Lance Peters is here for a follow-up. He is currently doing well status post right total knee arthroplasty. He reports experiencing left hip lateral pain, which worsens with activity. He denies any groin pain, thigh pain, or neurologic complaints.    Objective:  - Patient is in no acute distress.  - Tenderness present laterally over the greater trochanter.  - Hip range of motion is fully preserved.    Imaging:  - Multiple views demonstrate a well-appearing left hip with preservation of joint space.    Impression:  1. Left hip greater trochanteric tendinopathy/bursitis.    Plan:  - Discussed and reviewed radiographs.  - Discussed ongoing conservative care.  - Referred patient for physical therapy.  - Provided greater trochanteric bursitis information handout.    Follow-up:  - Follow-up as needed based on response to physical therapy and symptom progression.    Note summarized by the [employer].

## 2022-12-30 ENCOUNTER — Other Ambulatory Visit: Payer: Self-pay

## 2022-12-30 ENCOUNTER — Ambulatory Visit
Payer: BLUE CROSS/BLUE SHIELD | Attending: Orthopedic Surgery | Admitting: Rehabilitative and Restorative Service Providers"

## 2022-12-30 DIAGNOSIS — M7062 Trochanteric bursitis, left hip: Secondary | ICD-10-CM | POA: Insufficient documentation

## 2022-12-30 NOTE — Progress Notes (Signed)
Sent via: eRecord EMR    Physician attestation for Plan of Care: Physician: Laurance Flatten, MD  Per signature, I have reviewed and agree with the documented plan of care.    Signature: ___________________________________________________  ____________________________________________________________  Plan of Care     The physician's co-signature on this note indicates that they have reviewed this evaluation and agree with the documented goals and plan of care.          [facility]  Part of [facility]  Lower Extremity Initial Evaluation      Name: Lance Peters  DOB: 03/09/1961  Referring Physician: Laurance Flatten, MD  Today's Date: 12/30/2022  Visit #: 1      Diagnosis:     ICD-10-CM ICD-9-CM   1. Trochanteric bursitis of left hip  M70.62 726.5     Pain started in May 2024 with no reason, he has been trying to do some hamstring stretches but these have not helped, he has a particularly hard time if he is on his feet a lot at work, if he has to go up and down the stairs a lot or squat a lot. He had an appt with ortho who reassured him that his hip joints look ok as far as the arthritis goes but that he has bursitis.     Subjective  Lance Peters is a 62 y.o. male who is present today for left hip care.   Mechanism of injury/history of symptoms:  No specific cause  Onset date:   see above, Chronic  Symptoms:  (frequency/location/type/ worsen/improve) Constant   Pain level (0 - 10 scale): Now 3 Best 3 Worst 6     Date of surgery:  R TKA- 07/18/2021    History  Prior therapy:  PT for the R knee, nothing for the L hip  Previous providers seen: orthopedist  Diagnostic tests: Per report, reviewed, X-ray  Night Pain: Yes   Restful sleep:   No  Morning Pain/Stiffness: Increased   Relevant Comorbidities/Personal Factors: Arthritis.  These are relevant factors as they: may affect a Pt's ability to exercise and response exercise, thus impacting treatment outcome and plan of  care.   Surgical history: Knee replacement surgery, see above  Current Medications: Current medications reviewed with patient by evaluating therapist and potential effects on patient participation/outcome for therapy.      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  Assistive device:  none  Functional: Walking - severe limitation  Walking initially - moderate limitation  Walking > 15 min - severe limitation  Walking up hills - moderate limitation  Walking down hills - moderate limitation  Standing for 15 min - moderate limitation  Sitting for 15 min - severe limitation  Descend stairs  - mild limitation  Ascend stairs - mild limitation  Stepping up/down curbs - moderate limitation  Putting on socks/shoes - no limitation  Rolling over in bed -  severe limitation  Twisting/pivoting on  involved leg -  severe limitation  Recreational activities -  severe limitation  Deep squatting -  moderate limitation  Getting into and out of car -  moderate limitation  Getting in/out of bathtub -  moderate limitation  Heavy work -  severe limitation  Light to moderate work -  severe limitation  Pt stated goals: Decrease pain, increase ability to do stairs and walk  Outcomes: HOS: 43 % perceived ability, 29/68 points         Objective  Blood pressure: Deferred, will assess as indicated  Observation: Unremarkable  Posture:  slightly forward flexed posture  Gait: FWB, Antalgic  Palpation:  tenderness @ localized over his GT  Incision:  NA    ROM / STRENGTH     HIP LEFT RIGHT   ROM (deg) AROM PROM AROM PROM   Flexion  135  135   Extension  18  20    Abduction  26  36    IR at 0        IR at 90  26*  25    ER at 0        ER at 90  34  35    Strength (lbs)   Gluteus Maximus  20.9 33.9   Gluteus Medius  21.6 30.3   Hip Flexors 31.2 41.9   Adductors     Internal Rotators 9.0 13.6   External Rotators  11.5 15.1   Quadriceps 34.3 46.0   Hamstrings 35.5 39.4   *denotes pain     LE Functional Screen:   Sit to stand: R trunk lean, UE push off    LE Flexibility:  Iliopsoas: Poor  Quadricep:  Poor  Hamstring:  Fair    Special Tests:   Hip FADIR: right negative left negative  FABER: right negative left positive  Log Roll: right negative left negative  Thomas Test: right negative left positive  Gluteal tendinopathy: right negative left positive  Hip Scouring: right negative left negative   Knee NA   Ankle NA     Balance Assessment: NT today, will assess at a later time    Assessment:  Findings consistent with 62 y.o., male with left hip GT bursitis.     A history with No personal factors and/or comorbidities that impact the plan of care.  An examination of body system(s) using standardized test and measures addressing 1-2 elements from any of the following: body structures and functions, activity limitations and/or participation restrictions.  A clinical presentation with stable and/or uncomplicated characteristics. A Low Complexity evaluation was performed today.     Prognosis:  Good   Contraindications/Precautions/Limitation:  Per diagnosis    Goal Length Status   Pt will demonstrate I and compliance with current HEP. 2 weeks New   Pt will demonstrate good understanding of I sxs management techniques and activity modifications recommended by PT. 2 weeks New   Pt will report >2 point reduction on NPRS in order to demonstrate a clinically significant  improvement in pain. 4 weeks New   Pt will demonstrate >90% quadriceps index of the involved knee in order to return to sport specific activities. 6 weeks New   Pt will demonstrate > 9 point improvement in the Hip Outcome Score (HOS) in order to demonstrate a clinically significant improvement in functional ability and ADL performance. 12 weeks New   Pt will demonstrate good knowledge and I with final HEP in order to transition to gym program. 12 weeks New  Treatment Plan:   Anticipated plan of care reviewed with patient and/or family:  Yes  Freq 1 times per week for 3 month(s)    Treatment plan inclusive of:   Exercise: AROM, AAROM, PROM, Stretching, Strengthening, Progressive Resistive, Aerobic exercise   Manual Techniques:  Joint mobilization, Soft tissue release/massage   Modalities: Cold pack, Moist heat   Aquatic therapy: NA   Neuro Re-ed: Balance and Posture   Functional: Proprioception/Dynamic stability, Functional rehab, Work Orthoptist, Economist, Gait training, Balance , Endurance training   CPTs: Therapeutic Exercise (97110), Therapeutic Activity (97530), and Neuromuscular Red-ed (82956)      Treatment today consisted of:    Treatment: Details   THERAPEUTIC EXERCISE    Off edge hip flexor stretch 1 min   Prone quad stretch X30"   Prone IR/ER 3" x 20   Prone hip ext X 10   Bridge X 10        MANUAL            NEUROMUSCULAR RE-ED            THERAPEUTIC ACTIVITY            GAIT        MODALITIES            OTHER            Thank you for referring this patient to [facility]    Rebeca Allegra, PT         Time Reporting Minutes   Service-Based Procedures/ Modalities    Evaluation (high, moderate, low) 32   Re-evaluation    Traction, mechanical    Electric stimulation (unattended)    Total service-based billable procedures 32       Time-Based  Procedures / Modalities    Therapeutic ex 23   Neuromuscular Re-ed    Manual Therapy     Therapeutic Activities    Gait training, including stairs    Ultrasound    Electrical Stimulation    Iontophoresis    Physical Performance Test    Aquatic Therapy    Total Time-Based Billable Procedures 23       Total Skilled Treatment Time 55

## 2023-01-08 ENCOUNTER — Other Ambulatory Visit: Payer: Self-pay

## 2023-01-08 ENCOUNTER — Ambulatory Visit
Payer: BLUE CROSS/BLUE SHIELD | Attending: Orthopedic Surgery | Admitting: Rehabilitative and Restorative Service Providers"

## 2023-01-08 DIAGNOSIS — M7062 Trochanteric bursitis, left hip: Secondary | ICD-10-CM | POA: Insufficient documentation

## 2023-01-08 NOTE — Progress Notes (Signed)
[facility]  Part of [facility]  Daily Note    Name: Lance Peters  DOB: 1960/06/22  Referring Physician: Laurance Flatten, MD  Today's Date: 01/08/2023  Visit #: 2    Diagnosis:     ICD-10-CM ICD-9-CM   1. Trochanteric bursitis of left hip  M70.62 726.5          Subjective:  Pt reports an improvement in pain intensity and pain frequency since previous treatment session.  His hip is feeling a little bit better coming in to PT today, he gives it a 4/10 coming in to PT today, he has had moments that the pain has been as low as a 1/10. He has been doing his exercises and has found that all the things in his day to day that were bothering it were things that would flex his hip. He was a little bit sore after his first evaluation appointment.     Objective:     Performed Treatment Details    THERAPEUTIC EXERCISE    x Upright Bike 5 min, seat 6   x Off edge hip flexor 3 min    x Prone quad stretch 30" x 3   x Prone hip ext X 30   x Prone hip IR/ER 3" x 30    x Bridge 5" x 30    x Curl up  X 30               MANUAL               NEUROMUSCULAR RE-ED               THERAPEUTIC ACTIVITY               GAIT          MODALITIES               OTHER                    Updated Measurements    ROM -       Swelling  -       Strength -             Education: Updated HEP, Verbal cues for ther ex    Assessment:  Patient has demonstrated symptom improvement with his initial HEP and activity modifications. Progressed the repetitions in his program today and did well with this, also added in a curl up with no issues. Reviewed modifications to their HEP and patient verbalizes understanding. Patient remains a great candidate for skilled therapy services to address remaining ROM, strength, and functional deficits and to facilitate return to prior level of function.            Plan of Care: Continue with progression of treatment to be guided by patient response.    Thank you for referring  this patient to [facility].    Rebeca Allegra, PT       Time Reporting Minutes   Service-Based Procedures/ Modalities    Evaluation (high, moderate, low)    Re-evaluation    Traction, mechanical    Electric stimulation (unattended)    Total service-based billable procedures        Time-Based  Procedures / Modalities    Therapeutic ex 34   Neuromuscular Re-ed    Manual Therapy    Therapeutic Activities    Gait training, including stairs    Ultrasound  Electrical Stimulation    Iontophoresis    Physical Performance Test    Aquatic Therapy    Total Time-Based Billable Procedures 34       Total Skilled Treatment Time 34

## 2023-01-12 ENCOUNTER — Telehealth: Payer: Self-pay | Admitting: Orthopedic Surgery

## 2023-01-12 MED ORDER — AMOXICILLIN 500 MG PO CAPS *I*
2000.0000 mg | ORAL_CAPSULE | Freq: Once | ORAL | 0 refills | Status: AC
Start: 2023-01-12 — End: 2023-01-12

## 2023-01-12 NOTE — Telephone Encounter (Signed)
Patient has a upcoming dental appointment and is requesting a antibiotic be sent to [facility].  Patient is s/p right TKA from 07/18/21

## 2023-01-13 ENCOUNTER — Other Ambulatory Visit: Payer: Self-pay | Admitting: Primary Care

## 2023-01-13 NOTE — Telephone Encounter (Signed)
Last office visit:   11/19/2021  Last telehome visit:   Visit date not found  Patients upcoming appointments:  Future Appointments   Date Time Provider Department Center   01/22/2023  4:00 PM Rebeca Allegra, PT [facility] None   01/29/2023  4:15 PM Orlene Och, Lillia Abed, PT [facility] None   12/23/2023  2:00 PM Guadalupe Maple, MD [facility] None     BP Readings from Last 3 Encounters:   12/09/22 139/78   07/23/22 138/85   01/07/22 (!) 154/94      Recent Lab results:  GENERAL CHEMISTRY   No value within the past 365 days   LIPID PROFILE   No value within the past 365 days   LIVER PROFILE   No value within the past 365 days   DIABETES THYROID   No value within the past 365 days No value within the past 365 days      Pending/Orders Labs:  Lab Frequency Next Occurrence

## 2023-01-15 ENCOUNTER — Ambulatory Visit: Payer: BLUE CROSS/BLUE SHIELD | Admitting: Rehabilitative and Restorative Service Providers"

## 2023-01-22 ENCOUNTER — Ambulatory Visit: Payer: BLUE CROSS/BLUE SHIELD | Admitting: Rehabilitative and Restorative Service Providers"

## 2023-01-22 ENCOUNTER — Other Ambulatory Visit: Payer: Self-pay

## 2023-01-22 DIAGNOSIS — M7062 Trochanteric bursitis, left hip: Secondary | ICD-10-CM

## 2023-01-22 NOTE — Progress Notes (Signed)
[facility]  Part of [facility]  Daily Note    Name: Lance Peters  DOB: [birth year]  Referring Physician: Laurance Flatten, MD  Today's Date: 01/22/2023  Visit #: 3    Diagnosis:     ICD-10-CM ICD-9-CM   1. Trochanteric bursitis of left hip  M70.62 726.5       Subjective:  Pt reports an improvement in pain intensity and pain frequency since previous treatment session.  He does not have any pain coming in to PT today but did have some pain yesterday but has noticed that his hip will be sore after he is on it for long periods of time, he worked for 17 hours the day before it was sore. He has been working on his HEP and feels that this is going well. Denies any adverse response to last session.     Objective:     Performed Treatment Details    THERAPEUTIC EXERCISE    x Upright Bike 5 min, seat 6   x Off edge hip flexor 3 min    x Prone quad stretch 30" x 3   x Prone hip ext Knee bent  Knee straight  X 30   x Prone hip IR/ER 3" x 30    x Bridge 5" x 30    x Curl up  X 30    x Remedial side bridge X10          MANUAL               NEUROMUSCULAR RE-ED               THERAPEUTIC ACTIVITY               GAIT          MODALITIES               OTHER                    Updated Measurements    ROM -       Swelling  -       Strength -             Education: Updated HEP, Verbal cues for ther ex    Assessment:  Patient has demonstrated symptom improvement with his continued HEP and activity modifications. Progressed the repetitions in his program today and did well with this, also added in a knee flexed hip extension and a side remedial bridge for continued glut and oblique strengthening and he did well with these. Reviewed modifications to their HEP and patient verbalizes understanding. Patient remains a great candidate for skilled therapy services to address remaining ROM, strength, and functional deficits and to facilitate return to prior level of function.            Plan  of Care: Continue with progression of treatment to be guided by patient response.    Thank you for referring this patient to [facility].    Rebeca Allegra, PT       Time Reporting Minutes   Service-Based Procedures/ Modalities    Evaluation (high, moderate, low)    Re-evaluation    Traction, mechanical    Electric stimulation (unattended)    Total service-based billable procedures        Time-Based  Procedures / Modalities    Therapeutic ex 39   Neuromuscular Re-ed    Manual Therapy  Therapeutic Activities    Gait training, including stairs    Ultrasound    Electrical Stimulation    Iontophoresis    Physical Performance Test    Aquatic Therapy    Total Time-Based Billable Procedures 39       Total Skilled Treatment Time 86

## 2023-01-29 ENCOUNTER — Ambulatory Visit: Payer: BLUE CROSS/BLUE SHIELD | Admitting: Rehabilitative and Restorative Service Providers"

## 2023-02-20 ENCOUNTER — Other Ambulatory Visit: Payer: Self-pay | Admitting: Primary Care

## 2023-02-20 NOTE — Telephone Encounter (Signed)
Last office visit:   11/19/2021  Last telehome visit:   Visit date not found  Patients upcoming appointments:  Future Appointments   Date Time Provider Department Center   12/23/2023  2:00 PM Guadalupe Maple, MD [facility] None     BP Readings from Last 3 Encounters:   12/09/22 139/78   07/23/22 138/85   01/07/22 (!) 154/94      Recent Lab results:  GENERAL CHEMISTRY   No value within the past 365 days   LIPID PROFILE   No value within the past 365 days   LIVER PROFILE   No value within the past 365 days   DIABETES THYROID   No value within the past 365 days No value within the past 365 days      Pending/Orders Labs:  Lab Frequency Next Occurrence

## 2023-02-24 ENCOUNTER — Ambulatory Visit
Payer: BLUE CROSS/BLUE SHIELD | Attending: Orthopedic Surgery | Admitting: Rehabilitative and Restorative Service Providers"

## 2023-02-24 ENCOUNTER — Other Ambulatory Visit: Payer: Self-pay

## 2023-02-24 DIAGNOSIS — M7062 Trochanteric bursitis, left hip: Secondary | ICD-10-CM | POA: Insufficient documentation

## 2023-02-24 NOTE — Progress Notes (Unsigned)
[facility]  Part of [facility]  Daily Note    Name: Lance Peters  DOB: [birth year]  Referring Physician: Laurance Flatten, MD  Today's Date: 02/24/2023  Visit #: 4    Diagnosis:     ICD-10-CM ICD-9-CM   1. Trochanteric bursitis of left hip  M70.62 726.5       Subjective:  Pt reports an improvement in pain intensity and pain frequency since previous treatment session.  His hip is doing well, it is a little bit sore coming in to PT today, would give it maybe a 1-2/10.  He has been very pleased with how the hip has been doing, he will get sore after he does a lot of activity and then sits down but he does his stretches when he needs to and they are able to help to release the pain.   Denies any adverse response to last session.     Objective:     Performed Treatment Details    THERAPEUTIC EXERCISE    x Upright Bike 5 min, seat 6   x Off edge hip flexor 3 min    x Prone quad stretch 30" x 2   x Prone hip ext Knee bent  Knee straight  X 30   x Prone hip IR/ER 3" x 30    x Bridge 5" x 30    x Curl up  X 30    x Remedial side bridge X10          MANUAL               NEUROMUSCULAR RE-ED               THERAPEUTIC ACTIVITY               GAIT          MODALITIES               OTHER                    Updated Measurements    ROM -       Swelling  -       Strength See below         HIP LEFT RIGHT   ROM (deg) AROM PROM AROM PROM   Flexion  135  135   Extension  18  20    Abduction  26  36    IR at 0        IR at 90  26*  25    ER at 0        ER at 90  34  35    Strength (lbs)   Gluteus Maximus  20.9  40.2 33.9  44.1   Gluteus Medius  21.6  55.2 30.3   Hip Flexors 31.2  54.6 41.9   Adductors     Internal Rotators 9.0  13.7 13.6   External Rotators  11.5  17.7 15.1   Quadriceps 34.3  57.3 46.0   Hamstrings 35.5  33.9 39.4   *denotes pain   Current measurement is below previous.       Education: Updated HEP, Verbal cues for ther ex    Assessment:  Celio has been doing much  better since starting with PT and is able to control and relieve his symptoms with activity modifications and with exercises. He could be doing the strengthening exercises more and this was highlighted with  him. Reviewed modifications to their HEP and patient verbalizes understanding. Patient remains a great candidate for skilled therapy services to address remaining ROM, strength, and functional deficits and to facilitate return to prior level of function.            Plan of Care: Continue with progression of treatment to be guided by patient response.    Thank you for referring this patient to [facility].    Rebeca Allegra, PT       Time Reporting Minutes   Service-Based Procedures/ Modalities    Evaluation (high, moderate, low)    Re-evaluation    Traction, mechanical    Electric stimulation (unattended)    Total service-based billable procedures        Time-Based  Procedures / Modalities    Therapeutic ex 39   Neuromuscular Re-ed    Manual Therapy    Therapeutic Activities    Gait training, including stairs    Ultrasound    Electrical Stimulation    Iontophoresis    Physical Performance Test    Aquatic Therapy    Total Time-Based Billable Procedures 39       Total Skilled Treatment Time 39

## 2023-03-23 ENCOUNTER — Other Ambulatory Visit: Payer: Self-pay

## 2023-03-23 ENCOUNTER — Ambulatory Visit: Payer: BLUE CROSS/BLUE SHIELD | Admitting: Rehabilitative and Restorative Service Providers"

## 2023-03-24 ENCOUNTER — Ambulatory Visit
Payer: BLUE CROSS/BLUE SHIELD | Attending: Orthopedic Surgery | Admitting: Rehabilitative and Restorative Service Providers"

## 2023-03-24 ENCOUNTER — Other Ambulatory Visit: Payer: Self-pay

## 2023-03-24 DIAGNOSIS — M7062 Trochanteric bursitis, left hip: Secondary | ICD-10-CM | POA: Insufficient documentation

## 2023-03-24 NOTE — Progress Notes (Unsigned)
[facility]  Part of [facility]  Daily Note    Name: Lance Peters  DOB: 31-Aug-1960  Referring Physician: Laurance Flatten, MD  Today's Date: 03/24/2023  Visit #: 5    Diagnosis:     ICD-10-CM ICD-9-CM   1. Trochanteric bursitis of left hip  M70.62 726.5       Subjective:  Pt reports an improvement in pain intensity and pain frequency since previous treatment session.  His hip is doing well, it is a little bit sore coming in to PT today, would give it maybe a 1-2/10.  He has been very pleased with how the hip has been doing, if he does have any discomfort he is able to do some of his hip IR cross body stretches and this takes care of it. Has been sleeping better and able to lay on that side with no issues. Denies any adverse response to last session.     Objective:     Performed Treatment Details    THERAPEUTIC EXERCISE    x Upright Bike 5 min, seat 6   x Off edge hip flexor 3 min    x Supine hip IR stretch 10" x 5    Prone quad stretch 30" x 2   x Prone hip ext Knee bent  Knee straight  X 30   x Prone hip IR/ER 3" x 20    x Bridge 5" x 30    x Curl up  X 30    x Remedial side bridge X10                    MANUAL               NEUROMUSCULAR RE-ED    x SLS 10" x 3         THERAPEUTIC ACTIVITY               GAIT          MODALITIES               OTHER                    Updated Measurements    ROM -       Swelling  -       Strength See below           Education: Updated HEP, Verbal cues for ther ex    Assessment:  [name] continues to be doing really well in regards to managing his hip pain, able to relieve pain with stretches provided. Progressed program today with some light balance work for stability and he was appropriately challenged by this. Will attempt to discharge to his HEP after next session. Reviewed modifications to their HEP and patient verbalizes understanding. Patient remains a great candidate for skilled therapy services to address remaining ROM,  strength, and functional deficits and to facilitate return to prior level of function.            Plan of Care: Continue with progression of treatment to be guided by patient response.    Thank you for referring this patient to [facility].    Rebeca Allegra, PT       Time Reporting Minutes   Service-Based Procedures/ Modalities    Evaluation (high, moderate, low)    Re-evaluation    Traction, mechanical    Electric stimulation (unattended)    Total  service-based billable procedures        Time-Based  Procedures / Modalities    Therapeutic ex 39   Neuromuscular Re-ed    Manual Therapy    Therapeutic Activities    Gait training, including stairs    Ultrasound    Electrical Stimulation    Iontophoresis    Physical Performance Test    Aquatic Therapy    Total Time-Based Billable Procedures 39       Total Skilled Treatment Time 39

## 2023-04-26 ENCOUNTER — Other Ambulatory Visit: Payer: Self-pay

## 2023-04-27 ENCOUNTER — Ambulatory Visit
Payer: BLUE CROSS/BLUE SHIELD | Attending: Orthopedic Surgery | Admitting: Rehabilitative and Restorative Service Providers"

## 2023-04-27 DIAGNOSIS — M7062 Trochanteric bursitis, left hip: Secondary | ICD-10-CM | POA: Insufficient documentation

## 2023-04-27 NOTE — Progress Notes (Signed)
 [facility]  Part of [facility]  Daily Note/Discharge    Name: Lance Peters  DOB: 28-Mar-1961  Referring Physician: Laurance Flatten, MD  Today's Date: 04/27/2023  Visit #: Visit count could not be calculated. Make sure you are using a visit which is associated with an episode.    Diagnosis:     ICD-10-CM ICD-9-CM   1. Trochanteric bursitis of left hip  M70.62 726.5         Subjective:  Pt reports an improvement in pain intensity and pain frequency since previous treatment session.  His hip is doing well, he has actually been pain free for the last 3 days. He has been doing the exercises as needed to control symptoms. He has been able to sleep on that side. Denies any adverse response to last session.     Objective:     Performed Treatment Details    THERAPEUTIC EXERCISE    x Upright Bike 5 min, seat 6    Off edge hip flexor 3 min     Supine hip IR stretch 10" x 5    Prone quad stretch 30" x 2    Prone hip ext Knee bent  Knee straight  X 30    Prone hip IR/ER 3" x 20    x Bridge 5" x 30     Curl up  X 30     Remedial side bridge X10    x Measurements See below              MANUAL               NEUROMUSCULAR RE-ED    x SLS 10" x 3         THERAPEUTIC ACTIVITY               GAIT          MODALITIES               OTHER                    Updated Measurements    ROM See below       Swelling  -       Strength See below         HIP LEFT RIGHT   ROM (deg) AROM PROM AROM PROM   Flexion  135  135   Extension  18  20    Abduction  26  36    IR at 0        IR at 90  26*  46  25    ER at 0        ER at 90  34  35    Strength (lbs)   Gluteus Maximus  20.9  40.2  48.0 33.9  44.1   Gluteus Medius  21.6  55.2  66.9 30.3   Hip Flexors 31.2  54.6  59.5 41.9   Adductors     Internal Rotators 9.0  13.7  17.9 13.6   External Rotators  11.5  17.7  21.9 15.1   Quadriceps 34.3  57.3  74.8 46.0   Hamstrings 35.5  33.9  48.9 39.4     Hip Outcome Score:   ADL Subscale:  Standing for 15  min No D   Getting into and out of a car Slight D   Putting on socks and shoes No D   Walking up hills  Slight   Walking down hills Slight   Going up 1 flight of stairs No   Going down 1 flight of stairs No   Stepping up and down curbs No   Deep squatting No   Getting into and out of bathtub No   Sitting for 15 minutes No   Walking initially No   Walking for approx. 10 min No   Walking for > 15 min No   Twisting, pivoting on involved leg Slight   Rolling over in bed Slight   Light to mod. work No    Heavy work Slight   Recreational activities No    Total ( /76 points) 70/76--> 92%     Education: Updated HEP, Verbal cues for ther ex    Assessment:  Thoms continues to be doing really well in regards to managing his hip pain, and at this point he has been pain free for a few days. He has been able to significantly cut back on the amount of ibuprofen that he has needed to take and is able to modify daily activities and identify things that trigger his pain which has been really helpful. At this point his strength and ROM have returned to [facility] and he has been able to return to his prior level of activities and will be discharged to his HEP. He is welcome to reach back out to PT in the event that further care is needed.           Plan of Care: Continue with progression of treatment to be guided by patient response.    Thank you for referring this patient to [facility].    Rebeca Allegra, PT       Time Reporting Minutes   Service-Based Procedures/ Modalities    Evaluation (high, moderate, low)    Re-evaluation    Traction, mechanical    Electric stimulation (unattended)    Total service-based billable procedures        Time-Based  Procedures / Modalities    Therapeutic ex 32   Neuromuscular Re-ed    Manual Therapy    Therapeutic Activities    Gait training, including stairs    Ultrasound    Electrical Stimulation    Iontophoresis    Physical Performance Test    Aquatic Therapy     Total Time-Based Billable Procedures 32       Total Skilled Treatment Time 32

## 2023-06-22 ENCOUNTER — Other Ambulatory Visit: Payer: Self-pay | Admitting: Primary Care

## 2023-06-22 NOTE — Telephone Encounter (Signed)
 LOV 12/20/21  NOV 12/23/23  Labs 11/14/21    Last ordered: 7 months ago (11/14/2022

## 2023-07-15 ENCOUNTER — Other Ambulatory Visit: Payer: Self-pay | Admitting: Primary Care

## 2023-07-15 NOTE — Telephone Encounter (Signed)
 Last office visit:   11/19/2021  Last telehome visit:   Visit date not found  Patients upcoming appointments:  Future Appointments   Date Time Provider Department Center   12/23/2023  2:00 PM Guadalupe Maple, MD [facility] None     BP Readings from Last 3 Encounters:   12/09/22 139/78   07/23/22 138/85   01/07/22 (!) 154/94      Recent Lab results:  GENERAL CHEMISTRY   No value within the past 365 days   LIPID PROFILE   No value within the past 365 days   LIVER PROFILE   No value within the past 365 days   DIABETES THYROID   No value within the past 365 days No value within the past 365 days      Pending/Orders Labs:  Lab Frequency Next Occurrence

## 2023-07-26 ENCOUNTER — Other Ambulatory Visit: Payer: Self-pay | Admitting: Primary Care

## 2023-07-27 NOTE — Telephone Encounter (Signed)
 Last office visit:   11/19/2021  Last telehome visit:   Visit date not found  Patients upcoming appointments:  Future Appointments   Date Time Provider Department Center   12/23/2023  2:00 PM Guadalupe Maple, MD [facility] None     BP Readings from Last 3 Encounters:   12/09/22 139/78   07/23/22 138/85   01/07/22 (!) 154/94      Recent Lab results:  GENERAL CHEMISTRY   No value within the past 365 days   LIPID PROFILE   No value within the past 365 days   LIVER PROFILE   No value within the past 365 days   DIABETES THYROID   No value within the past 365 days No value within the past 365 days      Pending/Orders Labs:  Lab Frequency Next Occurrence

## 2023-08-12 IMAGING — CR DX Spine Lumbar Complete 4 Views
4 series · 4 of 4 positions shown · non-contrast
Comparison: none

Lumbar spine
CLINICAL HISTORY: Low back pain.

[l spine ap]
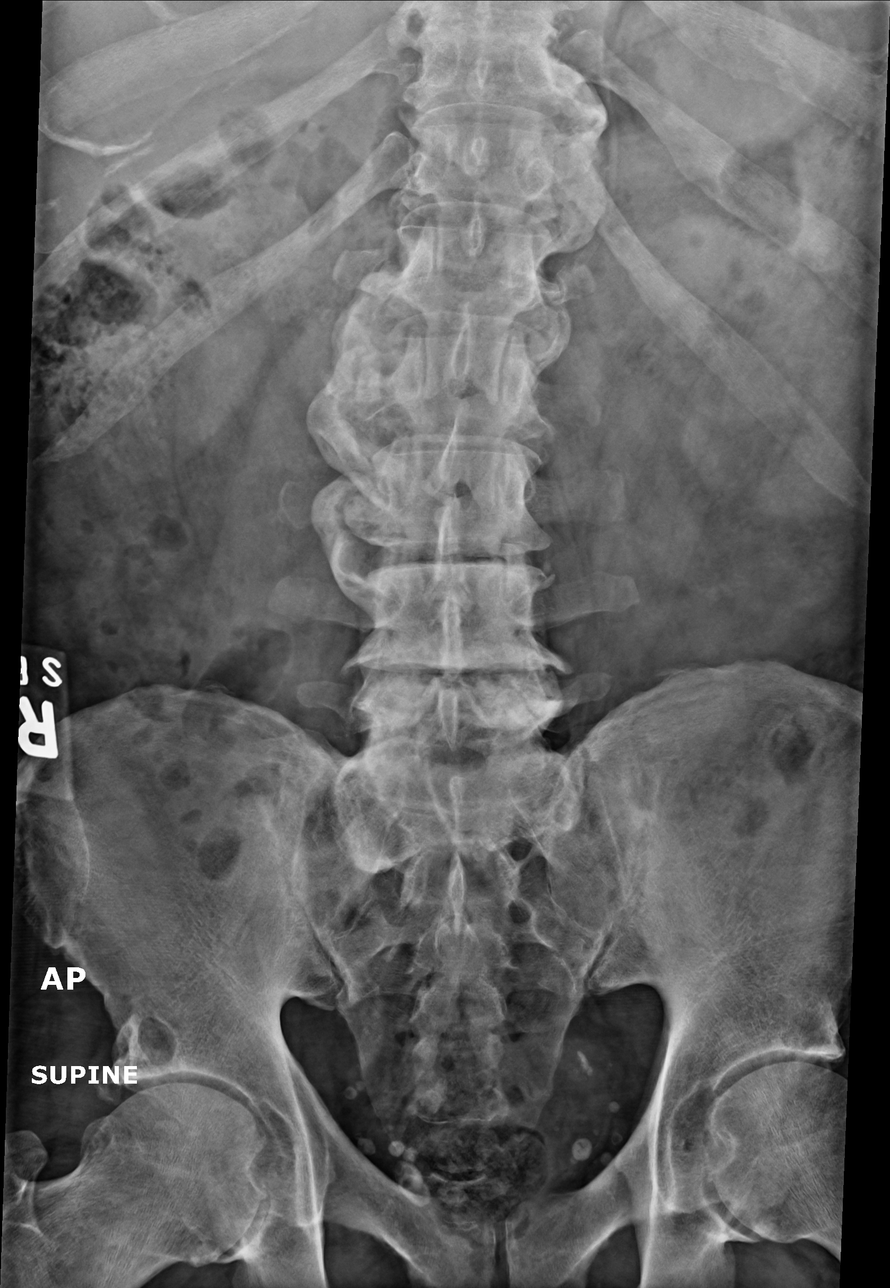

[l spine lat]
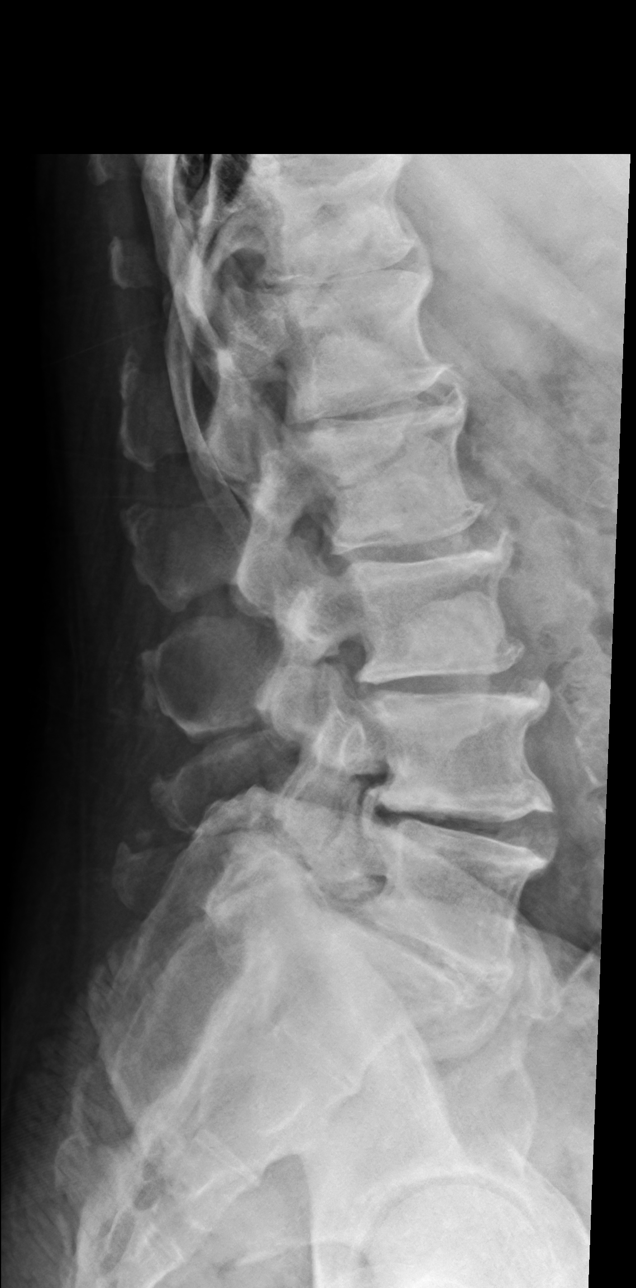

[l spine obl (1 of 2)]
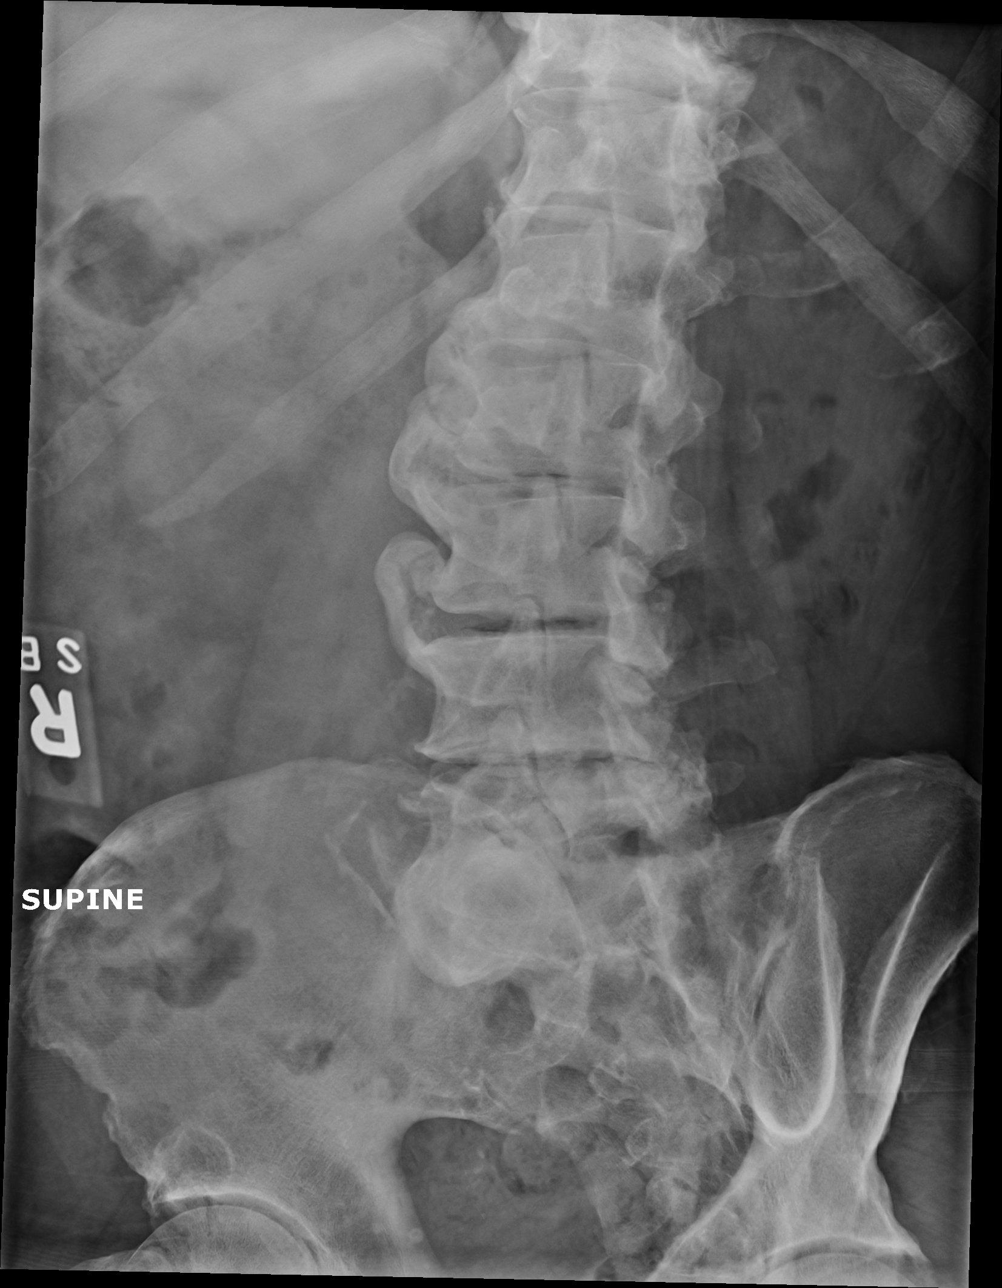

[l spine obl (2 of 2)]
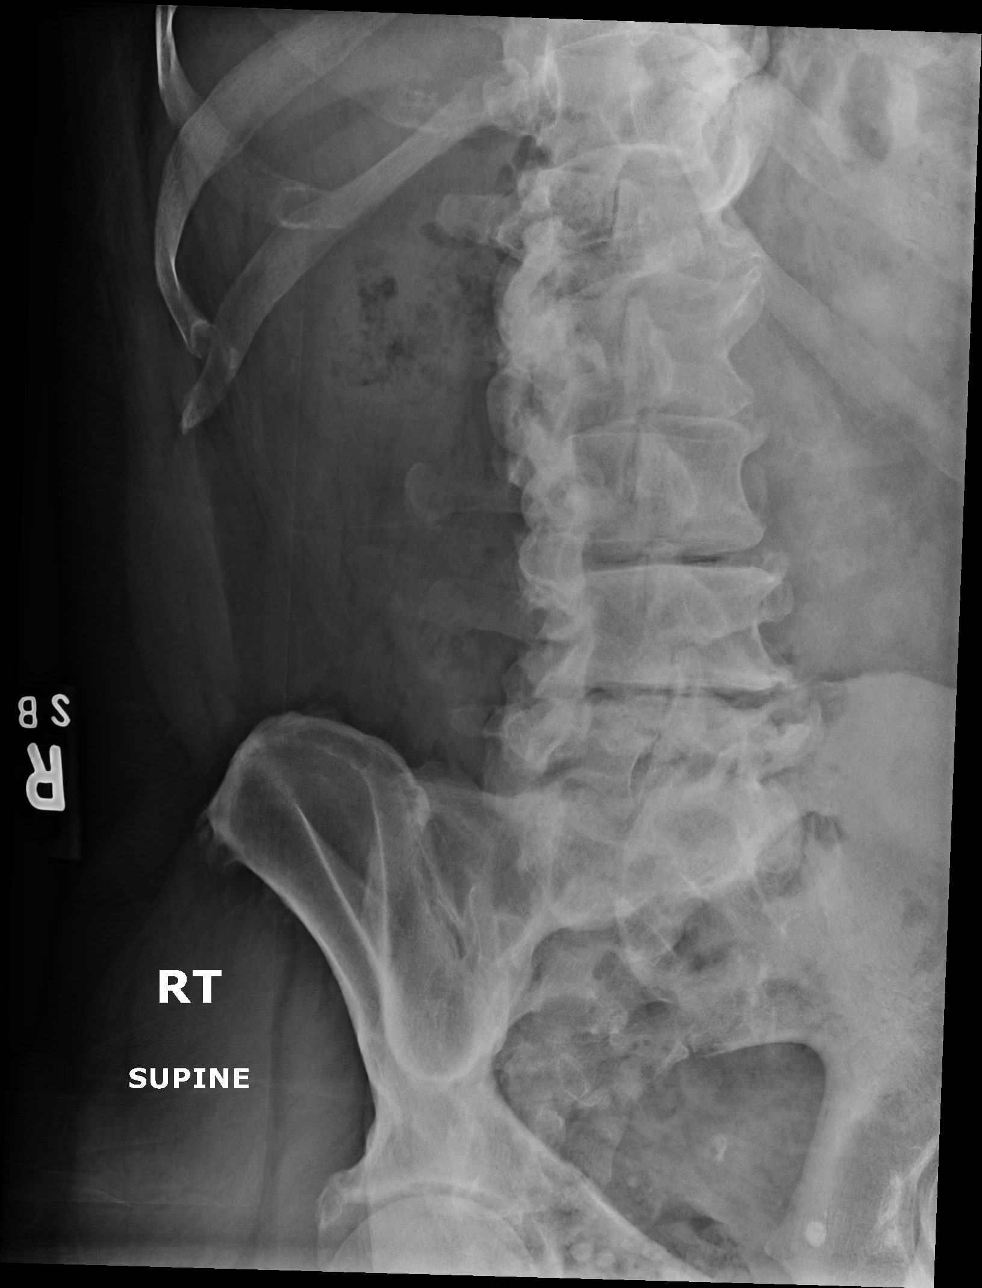

[4 of 4 positions shown; findings below may reference images not displayed]

FINDINGS: 4 view lumbar spine submitted.                                                            
 5 nonrib-bearing lumbar type vertebrae with a mild right convex lumbar                    
 curvature.                                                                                
 No acute fracture.                                                                        
 Suspect chronic bilateral L5 pars defects.                                                
 Degenerative disc disease all visualized lower thoracic and lumbar disc with              
 prominent bridging osteophytes of right laterally L1-L2 through L3-L4.                    
 Degenerative facet arthritis primarily L4-L5 and L5-S1.                                   
 1 cm anterolisthesis L5-S1.                                                               
 Pedicles intact. Degenerative changes SI joints and hips. Degenerative                    
 subchondral cyst superior lateral acetabuli right greater than left.                      
 Vascular calcification. Mild constipation. Pelvic phleboliths.
IMPRESSION: 1. Chronic bilateral L5 spondylolysis with grade 1 L5-S1 spondylolisthesis.               
 2. Multilevel lumbar spondylosis.

## 2023-08-21 ENCOUNTER — Other Ambulatory Visit: Payer: Self-pay | Admitting: Primary Care

## 2023-08-21 NOTE — Telephone Encounter (Signed)
 LOV 11/19/21  NOV 12/23/23  Labs 11/14/21      Last ordered: 6 months ago (02/20/2023

## 2023-11-20 ENCOUNTER — Other Ambulatory Visit: Payer: Self-pay | Admitting: Primary Care

## 2023-11-20 NOTE — Telephone Encounter (Signed)
 Last office visit:   11/19/2021  Last telehome visit:   Visit date not found  Patients upcoming appointments:  Future Appointments   Date Time Provider Department Center   12/23/2023  2:00 PM Guadalupe Maple, MD [facility] None     BP Readings from Last 3 Encounters:   12/09/22 139/78   07/23/22 138/85   01/07/22 (!) 154/94      Recent Lab results:  GENERAL CHEMISTRY   No value within the past 365 days   LIPID PROFILE   No value within the past 365 days   LIVER PROFILE   No value within the past 365 days   DIABETES THYROID   No value within the past 365 days No value within the past 365 days      Pending/Orders Labs:  Lab Frequency Next Occurrence

## 2023-12-14 ENCOUNTER — Telehealth: Payer: Self-pay | Admitting: Primary Care

## 2023-12-14 DIAGNOSIS — Z0000 Encounter for general adult medical examination without abnormal findings: Secondary | ICD-10-CM

## 2023-12-14 DIAGNOSIS — Z125 Encounter for screening for malignant neoplasm of prostate: Secondary | ICD-10-CM

## 2023-12-14 DIAGNOSIS — R7301 Impaired fasting glucose: Secondary | ICD-10-CM

## 2023-12-14 NOTE — Telephone Encounter (Signed)
 Patient has appt 12/23/23 needs lab orders. (Orders pending)

## 2023-12-15 NOTE — Telephone Encounter (Signed)
 ordered

## 2023-12-18 ENCOUNTER — Other Ambulatory Visit
Admission: RE | Admit: 2023-12-18 | Discharge: 2023-12-18 | Disposition: A | Discharge status: Home / Self Care | Source: Ambulatory Visit | Attending: Primary Care | Admitting: Primary Care

## 2023-12-18 DIAGNOSIS — Z125 Encounter for screening for malignant neoplasm of prostate: Secondary | ICD-10-CM | POA: Insufficient documentation

## 2023-12-18 DIAGNOSIS — R7301 Impaired fasting glucose: Secondary | ICD-10-CM | POA: Insufficient documentation

## 2023-12-18 DIAGNOSIS — Z0000 Encounter for general adult medical examination without abnormal findings: Secondary | ICD-10-CM | POA: Insufficient documentation

## 2023-12-18 LAB — HEMOGLOBIN A1C: Hemoglobin A1C: 7.4 % — ABNORMAL HIGH (ref <=5.6)

## 2023-12-18 LAB — COMPREHENSIVE METABOLIC PANEL
ALT: 38 U/L (ref 0–50)
AST: 28 U/L (ref 0–50)
Albumin: 4.3 g/dL (ref 3.5–5.2)
Alk Phos: 62 U/L (ref 40–130)
Anion Gap: 12 (ref 7–16)
Bilirubin,Total: 0.8 mg/dL (ref 0.0–1.2)
CO2: 23 mmol/L (ref 20–28)
Calcium: 9.1 mg/dL (ref 8.6–10.2)
Chloride: 104 mmol/L (ref 96–108)
Creatinine: 1 mg/dL (ref 0.67–1.17)
Glucose: 168 mg/dL — ABNORMAL HIGH (ref 60–99)
Lab: 15 mg/dL (ref 6–20)
Potassium: 4.5 mmol/L (ref 3.3–5.1)
Sodium: 139 mmol/L (ref 133–145)
Total Protein: 6.5 g/dL (ref 6.3–7.7)
eGFR BY CREAT: 84

## 2023-12-18 LAB — CBC
Hematocrit: 45 % (ref 37–52)
Hemoglobin: 14.2 g/dL (ref 12.0–17.0)
MCV: 83 fL (ref 75–100)
Platelets: 213 THOU/uL (ref 150–450)
RBC: 5.5 MIL/uL (ref 4.0–6.0)
RDW: 12.8 % (ref 0.0–15.0)
WBC: 6.6 THOU/uL (ref 3.5–11.0)

## 2023-12-18 LAB — LIPID PANEL
Chol/HDL Ratio: 4.3
Cholesterol: 138 mg/dL
HDL: 32 mg/dL — ABNORMAL LOW (ref 40–60)
LDL Calculated: 83 mg/dL
Non HDL Cholesterol: 106 mg/dL
Triglycerides: 130 mg/dL

## 2023-12-18 LAB — PSA (EFF.4-2010): PSA (eff. 4-2010): 2.36 ng/mL (ref 0.00–4.00)

## 2023-12-19 ENCOUNTER — Other Ambulatory Visit: Payer: Self-pay | Admitting: Primary Care

## 2023-12-21 NOTE — Telephone Encounter (Signed)
 Last office visit: 8/15/2023Last telehome visit: Visit date not foundPatients upcoming appointments:Future Appointments Date Time Provider Department Center 12/23/2023  2:00 PM Rankin Dunnings, MD PAN None BP Readings from Last 3 Encounters: 12/09/22 139/78 07/23/22 138/85 01/07/22 (!) 154/94  Recent Lab results:GENERAL CHEMISTRY Recent Labs   09/12/250934 NA 139 K 4.5 CL 104 CO2 23 GAP 12 UN 15 CREAT 1.00 GLU 168* CA 9.1  LIPID PROFILE Recent Labs   09/12/250934 CHOL 138 TRIG 130 HDL 32* LDLC 83  LIVER PROFILE Recent Labs   09/12/250934 ALT 38 AST 28 ALK 62 TB 0.8  DIABETES THYROID Recent Labs   09/12/250934 HA1C 7.4*  No value within the past 365 days  Pending/Orders Labs:Lab Frequency Next Occurrence

## 2023-12-22 ENCOUNTER — Other Ambulatory Visit: Payer: Self-pay

## 2023-12-23 ENCOUNTER — Encounter: Payer: Self-pay | Admitting: Primary Care

## 2023-12-23 ENCOUNTER — Other Ambulatory Visit: Payer: Self-pay

## 2023-12-23 ENCOUNTER — Ambulatory Visit: Payer: BLUE CROSS/BLUE SHIELD | Admitting: Primary Care

## 2023-12-23 VITALS — BP 110/68 | HR 75 | Temp 98.6°F | Resp 16 | Ht 67.72 in | Wt 182.3 lb

## 2023-12-23 DIAGNOSIS — R7301 Impaired fasting glucose: Secondary | ICD-10-CM

## 2023-12-23 DIAGNOSIS — H9319 Tinnitus, unspecified ear: Secondary | ICD-10-CM

## 2023-12-23 DIAGNOSIS — Z0000 Encounter for general adult medical examination without abnormal findings: Secondary | ICD-10-CM

## 2023-12-23 DIAGNOSIS — I10 Essential (primary) hypertension: Secondary | ICD-10-CM

## 2023-12-23 DIAGNOSIS — Z23 Encounter for immunization: Secondary | ICD-10-CM

## 2023-12-23 MED ORDER — EPINEPHRINE 0.3 MG/0.3ML IJ SOAJ *I*
0.3000 mg | INTRAMUSCULAR | 2 refills | Status: AC | PRN
Start: 2023-12-23 — End: ?

## 2023-12-23 MED ORDER — METFORMIN HCL 500 MG PO TB24 *I*
500.0000 mg | ORAL_TABLET | Freq: Every day | ORAL | 1 refills | Status: AC
Start: 2023-12-23 — End: ?

## 2023-12-23 NOTE — H&P (Signed)
 History and PhysicalHISTORY:No chief complaint on file.History of Present Illness:  HPIHistory of Present IllnessThe patient is a male who presents for a physical exam.He reports experiencing back pain since Sunday, which has been gradually improving but remains tight. He attributes this to overexertion and is considering the use of a muscle relaxer if the discomfort persists beyond the week.He mentions that his EpiPen  has expired and requires renewal. He has been diligent in taking his medications daily, although he occasionally misses a dose. His A1c levels have increased, despite maintaining a healthy diet. He has not exceeded the threshold for the past 7 years. He has lost weight, dropping from nearly 220 pounds to 165 pounds, and is currently maintaining a weight of around 175 to 180 pounds. He is considering starting medication for diabetes management.He monitors his blood pressure at home, which typically reads 120/68.He experiences tinnitus in both ears, with a high-pitched ringing in his right ear. 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 He is considering a referral to a specialist for further evaluation.He reports no exposure to sexually transmitted infections. He maintains an active lifestyle, including hiking and hunting, and plans to join a gym. He does not experience chest pain, breathlessness, or lightheadedness during physical activity, although he did when he was heavier. He reports no sudden weakness on one side of his body, new or unusual headaches, changes in vision or hearing, persistent abdominal pain, nausea, diarrhea, blood in stool, or urinary issues. He does report a longer time to initiate urination and occasional nighttime urination, but these are not bothersome enough to warrant medication. He has a dermatologist and plans to schedule a skin  scan due to a dark spot that was previously deemed non-concerning. He is managing his depression and anxiety well with Zoloft . He had an ultrasound of his neck and leg at work, both of which were normal.Marital Status: MarriedOccupation: RetiredHobbies: Hiking, huntingDiet: Healthy dietAlcohol: No alcohol consumption for quite a whileTobacco: Does not use tobaccoRecreational Drugs: Does not use recreational drugsLiving Condition: Lives with wife and mother-in-lawFAMILY HISTORYHe has a family history of diabetes.Problems:Patient Active Problem List Diagnosis Code  Knee pain M25.569  Shoulder pain M25.519  Impaired fasting glucose R73.01  Chest pain R07.9  Essential hypertension I10  Hyperlipidemia E78.5  Arthritis M19.90  Depression F32.A  Primary osteoarthritis of both knees, patellar>medial M17.0  Bilateral carpal tunnel syndrome G56.03  Ulnar neuropathy of right upper extremity G56.21  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  s/p RIght TKA 07/18/21 M17.11  Past Medical/Surgical History: Past Medical History[1]Past Surgical History[2]Allergies:  Allergies[3]Current medications:  Current Outpatient Medications Medication Sig  amLODIPine  (NORVASC ) 10 mg tablet TAKE 1 TABLET BY MOUTH EVERY DAY  benazepril  (LOTENSIN ) 10 mg tablet TAKE 1 TABLET BY MOUTH EVERY DAY  DULoxetine  (CYMBALTA ) 30 mg DR capsule TAKE 1 CAPSULE BY MOUTH EVERY DAY  atorvastatin  (LIPITOR) 20 mg tablet TAKE 1 TABLET BY MOUTH EVERY DAY WITH DINNER  sertraline  (ZOLOFT ) 50 mg tablet TAKE 1 TABLET BY MOUTH EVERY DAY  aspirin  81 mg EC tablet Take 1 tablet (81 mg total) by mouth daily  Please do not continue your previous aspirin  therapy until you have completed the full duration of DVT prevention medication. At that time you may return to your previous dose and frequency (number of times a day).  EPINEPHrine  0.3 mg/0.3 mL auto-injector Inject 0.3 mLs (0.3 mg  total) into the muscle as needed for Anaphylaxis (Patient taking  differently: Inject 0.3 mLs (0.3 mg total) into the muscle as needed for Anaphylaxis (Bee and wasp venom).) Family History:  Family History[4]Social/Occupational History: Social History Socioeconomic History  Marital status: Married Tobacco Use  Smoking status: Never  Smokeless tobacco: Never Substance and Sexual Activity  Alcohol use: No   Alcohol/week: 1.0 standard drink of alcohol   Types: 1 Cans of beer per week   Comment: abstinent since april 2018  Drug use: No Review of Systems:  Review of Systems Constitutional: Negative.        HENT: Negative.   Eyes: Negative.  Respiratory: Negative.   Cardiovascular: Negative.  Gastrointestinal: Negative.  Genitourinary: Negative.  Musculoskeletal: Negative.  Skin: Negative.  Neurological: Negative.  Endo/Heme/Allergies: Negative.  Psychiatric/Behavioral:        Other safety and screening topics discussed are detailed in the HPI. Vital Signs: BP 142/62 (BP Location: Right arm, Patient Position: Sitting, Cuff Size: adult)   Pulse 75   Temp 37 C (98.6 F) (Temporal)   Resp 16   Ht 1.72 m (5' 7.72)   Wt 82.7 kg (182 lb 4.8 oz)   SpO2 97%   BMI 27.95 kg/m PHYSICAL EXAM:Physical ExamVitals reviewed. Constitutional:     General: He is not in acute distress.   Appearance: He is well-developed. He is not diaphoretic. HENT:    Head: Normocephalic and atraumatic.    Right Ear: Tympanic membrane, ear canal and external ear normal.    Left Ear: Tympanic membrane, ear canal and external ear normal.    Nose: Nose normal.    Mouth/Throat:    Mouth: Mucous membranes are moist.    Pharynx: Oropharynx is clear. No oropharyngeal exudate. Eyes:    General: No scleral icterus.      Right eye: No discharge.       Left eye: No discharge.    Conjunctiva/sclera: Conjunctivae normal.     Pupils: Pupils are equal, round, and reactive to light. Neck:    Thyroid: No thyromegaly.    Vascular: No carotid bruit or JVD.    Trachea: No tracheal deviation. Cardiovascular:    Rate and Rhythm: Normal rate and regular rhythm.    Pulses: Normal pulses.    Heart sounds: Normal heart sounds. No murmur heard.   No friction rub. No gallop. Pulmonary:    Effort: Pulmonary effort is normal. No respiratory distress.    Breath sounds: Normal breath sounds. No stridor. No wheezing or rales. Chest:    Chest wall: No tenderness. Abdominal:    General: Bowel sounds are normal. There is no distension.    Palpations: Abdomen is soft.    Tenderness: There is no abdominal tenderness. There is no guarding or rebound. Musculoskeletal:       General: Normal range of motion.    Cervical back: Normal range of motion and neck supple. No rigidity. Lymphadenopathy:    Cervical: No cervical adenopathy. Skin:   General: Skin is warm and dry.    Findings: No erythema or rash. Neurological:    General: No focal deficit present.    Mental Status: He is alert and oriented to person, place, and time.    Motor: No abnormal muscle tone. Psychiatric:       Behavior: Behavior normal.       Thought Content: Thought content normal.       Judgment: Judgment normal. Recent Results (from the past 2 weeks) PSA (eff.07-2008)  Collection Time: 12/18/23  9:34 AM Result Value Ref Range  PSA (eff. 07-2008) 2.36  0.00 - 4.00 ng/mL CBC  Collection Time: 12/18/23  9:34 AM Result Value Ref Range  WBC 6.6 3.5 - 11.0 THOU/uL  RBC 5.5 4.0 - 6.0 MIL/uL  Hemoglobin 14.2 12.0 - 17.0 g/dL  Hematocrit 45 37 - 52 %  MCV 83 75 - 100 fL  RDW 12.8 0.0 - 15.0 %  Platelets 213 150 - 450 THOU/uL Hemoglobin A1c  Collection Time: 12/18/23  9:34 AM Result Value Ref Range  Hemoglobin A1C 7.4 (H) <=5.6 % Lipid Panel (Reflex to Direct  LDL if Triglycerides more than 400)   Collection Time: 12/18/23  9:34 AM Result Value Ref Range  Cholesterol 138 mg/dL  Triglycerides 869 mg/dL  HDL 32 (L) 40 - 60 mg/dL  LDL Calculated 83 mg/dL  Non HDL Cholesterol 893 mg/dL  Chol/HDL Ratio 4.3  Comprehensive metabolic panel  Collection Time: 12/18/23  9:34 AM Result Value Ref Range  Sodium 139 133 - 145 mmol/L  Potassium 4.5 3.3 - 5.1 mmol/L  Chloride 104 96 - 108 mmol/L  CO2 23 20 - 28 mmol/L  Anion Gap 12 7 - 16  UN 15 6 - 20 mg/dL  Creatinine 8.99 9.32 - 1.17 mg/dL  eGFR BY CREAT 84   Glucose 168 (H) 60 - 99 mg/dL  Calcium  9.1 8.6 - 10.2 mg/dL  Total Protein 6.5 6.3 - 7.7 g/dL  Albumin 4.3 3.5 - 5.2 g/dL  Bilirubin,Total 0.8 0.0 - 1.2 mg/dL  AST 28 0 - 50 U/L  ALT 38 0 - 50 U/L  Alk Phos 62 40 - 130 U/L  Assessment:  Lance Peters was seen today for annual exam.Diagnoses and all orders for this visit:Routine general medical examination at a health care facility-he is up-to-date with recommended screenings, immunizations, eye care, dental care.  Encouraged continuing daily physical activity, continuing with his healthy dietary practices emphasizing whole grains and plant-based foods.  Discussed joining the [facility], and making a point of going there every day as if it were his new job.Flu vaccine need-     Influenza Trivalent 21mo-39yr (Flucelvax)Essential hypertension-blood pressure is on target, recommend continuing current medications, lifestyle interventions as detailed above.Impaired fasting glucose/type 2 diabetes-in spite of his best efforts his hemoglobin A1c has increased.  Will start metformin  500 mg daily.  Continue healthy diet as noted above, recheck labs in 3 months with MyChart follow-up, and 6 months with appointment.  Consider adjusting the metformin  as needed to get glycemia on target.-     Basic metabolic panel; Future-     Hemoglobin A1c; Future-     Basic metabolic panel; Future-     Hemoglobin A1c;  FutureTinnitus, unspecified laterality-discussed that this is often a manifestation of hearing loss.  There may be some degree of auditory nerve dysfunction as well with the diabetes.  So perhaps this will improve with better glycemic control.  I recommended audiology referral to assess for hearing loss and consider need for amplification.-     AMB REFERRAL [facility] [1] Past Medical History:Diagnosis Date  Arthritis   elbows and shoulders   Chest pain, unspecified   Depression 05/23/2014  Neuromuscular disorder   elbows, shoulders  Osteoarthritis of both knees   Unspecified essential hypertension 06/02/2013 [2] Past Surgical History:Procedure Laterality Date  carpel tunnel surgery Left   HAND SURGERY    trigger finger release   PR ARTHRP KNE CONDYLE&PLATU MEDIAL&LAT COMPARTMENTS Right 07/18/2021  Procedure: RIGHT ARTHROPLASTY, KNEE, TOTAL;  Surgeon: Lajuanda Norleen MATSU, MD;  Location: [facility];  Service: Orthopedics  PR ARTHRS KNE SURG W/MENISCECTOMY MED/LAT W/SHVG Right 07/16/2016  Procedure: KNEE ARTHROSCOPY WITH PARTIAL MEDIAL MENISCECTOMY;  Surgeon: Geraldene Hones, MD;  Location: [facility];  Service: Orthopedics  PR COLONOSCOPY FLX DX W/COLLJ SPEC WHEN PFRMD N/A 04/30/2020  Procedure: COLONOSCOPY;  Surgeon: Robynn Riis, MD;  Location: [facility];  Service: GI [3] AllergiesAllergen Reactions  Wasp Venom Anaphylaxis, Hives and Swelling   Bees also per patient  Synvisc [Hylan G-F 20] Other (See Comments)   Possible pseudoseptic reaction to Synvisc-1 injections 09/2019 bilateral swelling [4] Family HistoryProblem Relation Name Age of Onset  Colon polyps Mother    Cancer Mother    Colon cancer Neg Hx    Anesthesia problems Neg Hx

## 2023-12-24 ENCOUNTER — Other Ambulatory Visit: Payer: Self-pay

## 2023-12-25 ENCOUNTER — Ambulatory Visit: Attending: Primary Care | Admitting: Audiology

## 2023-12-25 DIAGNOSIS — H903 Sensorineural hearing loss, bilateral: Secondary | ICD-10-CM | POA: Insufficient documentation

## 2023-12-25 DIAGNOSIS — H9313 Tinnitus, bilateral: Secondary | ICD-10-CM | POA: Insufficient documentation

## 2023-12-25 DIAGNOSIS — H938X3 Other specified disorders of ear, bilateral: Secondary | ICD-10-CM | POA: Insufficient documentation

## 2023-12-25 NOTE — Progress Notes (Signed)
 AUDIOLOGIC EVALUATION [facility] [address], [address],  [place]  [address]Eynwz: [phone], Fax: [phone] Outpatient VisitPatient: Lance Peters MR Number: [id number] Date of Birth: 02-26-1961 Date of Visit: 12/25/2023  PURE-TONE TEST RESULTSType of Testing: conventional    Test Reliability: goodTransducer: headphone (*Re-checked with inserts)Booth: 4ANSI S3.21.2004 (R2009) Air Conduction Testing (dB HL and kHz) LEFT EAR RIGHT EAR 0.125 0.25 0.50  0.75 1.0 1.5 2.0 3.0 4.0 6.0 8.0  0.125 0.25 0.50 0.75 1.0 1.5 2.0 3.0 4.0 6.0 8.0   15 15   10   10  40 35 50 45      15 10   10 20  30* 60* 65* 60* 55*                                                                           Effective Masking Level                                        50     Bone Conduction Testing (dB HL and kHz)Bone conduction was masked when appropriate (using inserts)  0.25 0.50  0.75 1.0 1.5 2.0 3.0 4.0     0.25 0.50 0.75 1.0 1.5 2.0 3.0 4.0    15 5   5   10   30               30  50 55                                                                            Effective Masking Level                                50 70 80    SPEECH AUDIOMETRY SAT SRT Score dB HL EML  Test  SAT SRT Score dB HL EML  Test     100 % 60 30 W-22 CD      92 % 75 45 W-22 CD EML   EML            EML   EML           Please refer to scanned [facility] [id number] MR for additional information NotesThreshold in dB HLFrequency in kiloHertz (kHz) Legend dB=decibelsHL=Hearing LevelNR=No ResponseVT=Vibro-Tactile EML=Effective Masking LevelSAT=Speech Awareness ThresholdSRT=Speech Reception  ThresholdMLV=Monitored Live VoiceCD=Compact Disk HISTORY: Mizael Sagar was referred by Dr. Carlin Gaudy for an audiological evaluation. He reported that he has perceived tinnitus in both ears (right > left) for at least five years, worsening over the last  two. Tinnitus was described as a constant tuning fork sound. He also noted aural fullness and ear pain/discomfort as well as itchiness in both ears at times. 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 Otalgia, recent ear infections, dizziness, and ear drainage were denied at today's visit.FINDINGS: Otoscopy revealed clear external ear canals, bilaterally. Pure-tone test results indicated mild to moderate sensorineural hearing loss from 3-8 kHz, left ear, and a mild to moderately-severe sensorineural hearing loss from 2-8 kHz, right ear. A 10-30 dB asymmetry was noted, right ear worse than left ear, from 2-8 kHz was noted. Speech recognition ability in quiet was excellent, bilaterally, when speech was presented at a loud conversational level, left ear, and at a loud level, right ear.TYMPANOMETRY:Left Ear: Tested Right Ear: Tested Frequency: 226 Hz Frequency: 226 Hz  Canal Volume (ml): 1.5 Canal Volume (ml): 1.6 Static Compliance Peak (ml): 0.5 Static Compliance Peak (ml): 0.5 Peak Pressure (daPa): 0 Peak Pressure (daPa): -5 Gradient (daPa): 75 Gradient (daPa): 80 Tympanometric test results showed clinically normal tympanic membrane mobility and middle ear pressure, consistent with normal Eustachian tube function, bilaterally.Test results were reviewed with the patient. The patient is a candidate for amplification in the right ear and a borderline hearing aid candidate in the left ear at this time. Patient was counseled on the importance of reducing tinnitus related distress. Tinnitus coping strategies including maintaining a low-level, sound-enriched environment for tinnitus relief were discussed (e.g.: white noise machine, fan, TV, radio, ear level devices, etc.). Patient was provided with a list of smart phone applications which may be used with headphones or with speakers  for tinnitus management. Due to the report of ear pain, itchiness, worsening tinnitus, and asymmetric sensorineural hearing loss, the patient was scheduled to see Dr. Morene Quay of [facility] Otol[facility]ology on 03/15/2024.RECOMMENDATIONS: Audiological re-evaluation as per Dr. Rankin.Keller CANDIE Canterbury, Au.D., CCC-AAudiologistUR Medicine Audiology

## 2024-01-08 ENCOUNTER — Other Ambulatory Visit: Payer: Self-pay | Admitting: Primary Care

## 2024-01-08 NOTE — Telephone Encounter (Signed)
 Last office visit: 9/17/2025Last telehome visit: Visit date not foundPatients upcoming appointments:Future Appointments Date Time Provider Department Center 03/15/2024  9:15 AM Kelby Katz, MD ENT None 06/21/2024 10:30 AM Rankin Dunnings, MD PAN None 12/28/2024  2:00 PM Rankin Dunnings, MD PAN None BP Readings from Last 3 Encounters: 12/23/23 110/68 12/09/22 139/78 07/23/22 138/85  Recent Lab results:GENERAL CHEMISTRY Recent Labs   09/12/250934 NA 139 K 4.5 CL 104 CO2 23 GAP 12 UN 15 CREAT 1.00 GLU 168* CA 9.1  LIPID PROFILE Recent Labs   09/12/250934 CHOL 138 TRIG 130 HDL 32* LDLC 83  LIVER PROFILE Recent Labs   09/12/250934 ALT 38 AST 28 ALK 62 TB 0.8  DIABETES THYROID Recent Labs   09/12/250934 HA1C 7.4*  No value within the past 365 days  Pending/Orders Labs:Lab Frequency Next Occurrence Basic metabolic panel Once 02/21/2024 Hemoglobin A1c Once 02/21/2024 Basic metabolic panel Once 05/21/2024 Hemoglobin A1c Once 04/21/2024

## 2024-02-17 ENCOUNTER — Other Ambulatory Visit: Payer: Self-pay | Admitting: Primary Care

## 2024-02-17 NOTE — Telephone Encounter (Signed)
 Last office visit: 9/17/2025Last telehome visit: Visit date not foundPatients upcoming appointments:Future Appointments Date Time Provider Department Center 03/15/2024  9:15 AM Kelby Katz, MD ENT None 06/21/2024 10:30 AM Rankin Dunnings, MD PAN None 12/28/2024  2:00 PM Rankin Dunnings, MD PAN None BP Readings from Last 3 Encounters: 12/23/23 110/68 12/09/22 139/78 07/23/22 138/85  Recent Lab results:GENERAL CHEMISTRY Recent Labs   09/12/250934 NA 139 K 4.5 CL 104 CO2 23 GAP 12 UN 15 CREAT 1.00 GLU 168* CA 9.1  LIPID PROFILE Recent Labs   09/12/250934 CHOL 138 TRIG 130 HDL 32* LDLC 83  LIVER PROFILE Recent Labs   09/12/250934 ALT 38 AST 28 ALK 62 TB 0.8  DIABETES THYROID Recent Labs   09/12/250934 HA1C 7.4*  No value within the past 365 days  Pending/Orders Labs:Lab Frequency Next Occurrence Basic metabolic panel Once 02/21/2024 Hemoglobin A1c Once 02/21/2024 Basic metabolic panel Once 05/21/2024 Hemoglobin A1c Once 04/21/2024

## 2024-03-14 ENCOUNTER — Other Ambulatory Visit: Payer: Self-pay

## 2024-03-15 ENCOUNTER — Ambulatory Visit: Admitting: Otolaryngology

## 2024-03-15 ENCOUNTER — Encounter: Payer: Self-pay | Admitting: Otolaryngology

## 2024-03-15 ENCOUNTER — Other Ambulatory Visit: Payer: Self-pay

## 2024-03-15 DIAGNOSIS — H9193 Unspecified hearing loss, bilateral: Secondary | ICD-10-CM

## 2024-03-15 DIAGNOSIS — L299 Pruritus, unspecified: Secondary | ICD-10-CM

## 2024-03-15 DIAGNOSIS — H918X3 Other specified hearing loss, bilateral: Secondary | ICD-10-CM

## 2024-03-15 DIAGNOSIS — H9313 Tinnitus, bilateral: Secondary | ICD-10-CM

## 2024-03-15 DIAGNOSIS — H60543 Acute eczematoid otitis externa, bilateral: Secondary | ICD-10-CM

## 2024-03-15 MED ORDER — FLUOCINOLONE ACETONIDE 0.01 % OT OIL: 3.0000 [drp] | TOPICAL_OIL | Freq: Every day | OTIC | 5 refills | Status: AC | PRN

## 2024-03-15 NOTE — Progress Notes (Signed)
 Patient seen on 03/15/2024 at 9:52 AM HPICC: Chief Complaint Patient presents with  Tinnitus  Ear Problem   itching  Treatment to date: Saw Audiology in SeptemberContext: Lance Peters is a 63 y.o. male who presents alone.  Has had itching and tinnitus.  Has a asymmetry in hearing with the right worse.Per his September 2025 audiology visit: He reported that he has perceived tinnitus in both ears (right > left) for at least five years, worsening over the last two. Tinnitus was described as a constant tuning fork sound. He also noted aural fullness and ear pain/discomfort as well as itchiness in both ears at times. 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 Otalgia, recent ear infections, dizziness, and ear drainage were denied at today's visit. He has no other complaints today.HISTORYI have personally reviewed and updated in the electronic record the patient's past medical, surgical, social, and family history. Pertinent history has been included in the HPI.Past Medical: He  has a past medical history of Anxiety (05/23/2014), Arthritis, Chest pain, unspecified, Depression (05/23/2014), Diabetes mellitus (09//2025), Neuromuscular disorder, Osteoarthritis of both knees, and Unspecified essential hypertension (06/02/2013).Past Surgical: He  has a past surgical history that includes Hand surgery; pr arthrs kne surg w/meniscectomy med/lat w/shvg (Right, 07/16/2016); pr colonoscopy flx dx w/collj spec when pfrmd (N/A, 04/30/2020); carpel tunnel surgery (Left); pr arthrp kne condyle&platu medial&lat compartments (Right, 07/18/2021); knee replacement (07/18/2021); and Knee arthroscopy (07/2016).Past Social: He  reports that he has never smoked. He has never used smokeless tobacco. He reports that he does not currently use alcohol after a past usage of about 1.0 standard drink  of alcohol per week. He reports being sexually active and has had partner(s) who are male. He reports using the following method of birth control/protection: None. He reports that he does not use drugs.Family History: His family history includes Cancer in his mother; Colon polyps in his mother.PHYSICAL EXAMVitals:  03/15/24 0928 Temp: (P) 36.4 C (97.5 F) TempSrc: (P) Temporal Height: (P) 1.727 m (5' 8)  The patient is well developed, well nourished, and in no acute distress. They are able to communicate without assistance or hoarseness. The patient had a calm affect.  Extraocular movements were intact, there was no nystagmus at rest.Right ear: The pinna was normal. The external auditory canal was dry and non-erythematous. The tympanic membrane was intact. No fluid was visible behind the tympanic membrane.  Left ear: The pinna was normal. The external auditory canal was dry and non-erythematous. The tympanic membrane was intact. No fluid was visible behind the tympanic membrane.  Had some eczema in the lateral canal of both ears.STUDIESStudies personally reviewed by myself.  12/25/2023  12:00 PM Conventional Audiogram R 250 Hz 15 R 500 Hz 10 R 1000 Hz 10 R 2000 Hz 30* R 4000 Hz 65* R 8000 Hz 55* R Speech 92 % L 250 Hz 15 L 500 Hz 15 L 1000 Hz 10 L 2000 Hz 10 L 4000 Hz 35 L 8000 Hz 45 L Speech 100 %  ASSESSMENT AND PLAN1. High frequency hearing loss, bilateral  MR head dns without contrast  2. Tinnitus, bilateral  MR head dns without contrast  3. Asymmetrical hearing loss  MR head dns without contrast  4. Ear itching  MR head dns without contrast  5. Eczema of external ear, bilateral  MR head dns without contrast  Lance Peters is a 63 y.o. male who presented for ear itching, tinnitus and hearing  asymmetryMRI of the IAC ordered due to asymmetry.Hearing aid on the right might be helpful.Prescribed  dermotic  for ear itchingDiscussed masking for tinnitus which helps.He will follow up as needed. Answers submitted by the patient for this visit:NEW PATIENT VISIT on 03/15/2024  9:15 AM with Morene Quay, MDTinnitus (ringing of ear) (Submitted on 03/08/2024)Chief Complaint: TinnitusAffected ear:: both

## 2024-03-15 NOTE — Progress Notes (Unsigned)
 Booked MRI Head DNS w/o contrast at [facility] 1.3.26  7:35PM (No Prep)   F/U  Dr. Nyra.9.26  8:30AMCentral to obtain authGave pt. Scheduling phone number to checkFor cancellations for 2025Confirmed Pt   12.9.25  1:21PM   SC

## 2024-03-21 ENCOUNTER — Other Ambulatory Visit
Admission: RE | Admit: 2024-03-21 | Discharge: 2024-03-21 | Disposition: A | Discharge status: Home / Self Care | Source: Ambulatory Visit | Attending: Primary Care | Admitting: Primary Care

## 2024-03-21 DIAGNOSIS — R7301 Impaired fasting glucose: Secondary | ICD-10-CM

## 2024-03-21 LAB — BASIC METABOLIC PANEL
Anion Gap: 11 (ref 7–16)
CO2: 25 mmol/L (ref 20–28)
Calcium: 9.5 mg/dL (ref 8.6–10.2)
Chloride: 103 mmol/L (ref 96–108)
Creatinine: 1.05 mg/dL (ref 0.67–1.17)
Glucose: 169 mg/dL — ABNORMAL HIGH (ref 60–99)
Lab: 18 mg/dL (ref 6–20)
Potassium: 4.5 mmol/L (ref 3.3–5.1)
Sodium: 139 mmol/L (ref 133–145)
eGFR BY CREAT: 79

## 2024-03-21 LAB — HEMOGLOBIN A1C: Hemoglobin A1C: 6.9 % — ABNORMAL HIGH (ref <=5.6)

## 2024-03-22 ENCOUNTER — Ambulatory Visit: Payer: Self-pay | Admitting: Primary Care

## 2024-03-22 NOTE — Result Encounter Note (Signed)
 mychart message sent

## 2024-04-05 ENCOUNTER — Other Ambulatory Visit: Payer: Self-pay

## 2024-04-06 ENCOUNTER — Ambulatory Visit
Admission: RE | Admit: 2024-04-06 | Discharge: 2024-04-06 | Disposition: A | Discharge status: Home / Self Care | Source: Ambulatory Visit | Attending: Otolaryngology | Admitting: Otolaryngology

## 2024-04-06 DIAGNOSIS — H60543 Acute eczematoid otitis externa, bilateral: Secondary | ICD-10-CM | POA: Insufficient documentation

## 2024-04-06 DIAGNOSIS — E119 Type 2 diabetes mellitus without complications: Secondary | ICD-10-CM | POA: Insufficient documentation

## 2024-04-06 DIAGNOSIS — I10 Essential (primary) hypertension: Secondary | ICD-10-CM | POA: Insufficient documentation

## 2024-04-06 DIAGNOSIS — H9193 Unspecified hearing loss, bilateral: Secondary | ICD-10-CM

## 2024-04-06 DIAGNOSIS — L299 Pruritus, unspecified: Secondary | ICD-10-CM | POA: Insufficient documentation

## 2024-04-06 DIAGNOSIS — H918X3 Other specified hearing loss, bilateral: Secondary | ICD-10-CM | POA: Insufficient documentation

## 2024-04-06 DIAGNOSIS — M170 Bilateral primary osteoarthritis of knee: Secondary | ICD-10-CM | POA: Insufficient documentation

## 2024-04-06 DIAGNOSIS — H9313 Tinnitus, bilateral: Secondary | ICD-10-CM | POA: Insufficient documentation

## 2024-04-09 ENCOUNTER — Other Ambulatory Visit

## 2024-04-14 ENCOUNTER — Other Ambulatory Visit: Payer: Self-pay

## 2024-04-15 ENCOUNTER — Encounter: Payer: Self-pay | Admitting: Otolaryngology

## 2024-04-15 ENCOUNTER — Ambulatory Visit: Admitting: Otolaryngology

## 2024-04-15 ENCOUNTER — Other Ambulatory Visit: Payer: Self-pay

## 2024-04-15 DIAGNOSIS — H9313 Tinnitus, bilateral: Secondary | ICD-10-CM

## 2024-04-15 DIAGNOSIS — H9193 Unspecified hearing loss, bilateral: Secondary | ICD-10-CM

## 2024-04-15 DIAGNOSIS — H918X3 Other specified hearing loss, bilateral: Secondary | ICD-10-CM

## 2024-04-15 DIAGNOSIS — L299 Pruritus, unspecified: Secondary | ICD-10-CM

## 2024-04-15 NOTE — Progress Notes (Signed)
 Patient seen on 04/15/2024 at 8:48 AM HPICC: Chief Complaint Patient presents with  Ear Problem  Treatment to date: Saw Audiology in SeptemberContext: Lance Peters is a 64 y.o. male who presents alone.  Has had itching and tinnitus.  Has a asymmetry in hearing with the right worse.  I last saw him 03/15/24 and ordered an MRI due to the hearing asymmetry.  He returns with his wife today.The itching has gotten a lot better on the dermotic  drops.  Per his September 2025 audiology visit: He reported that he has perceived tinnitus in both ears (right > left) for at least five years, worsening over the last two. Tinnitus was described as a constant tuning fork sound. He also noted aural fullness and ear pain/discomfort as well as itchiness in both ears at times. 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 Otalgia, recent ear infections, dizziness, and ear drainage were denied at today's visit. PHYSICAL EXAMVitals:  04/15/24 0846 Temp: (P) 36.1 C (96.9 F) TempSrc: (P) Temporal Weight: (P) 86.7 kg (191 lb 3.2 oz) Height: (P) 1.727 m (5' 8)  The patient is well developed, well nourished, and in no acute distress. They are able to communicate without assistance or hoarseness. The patient had a calm affect.  Extraocular movements were intact, there was no nystagmus at rest.Right ear: The pinna was normal. The external auditory canal was dry and non-erythematous. The tympanic membrane was intact. No fluid was visible behind the tympanic membrane.  Left ear: The pinna was normal. The external auditory canal was dry and non-erythematous. The tympanic membrane was intact. No fluid was visible behind the tympanic membrane.  Had some eczema in the lateral canal of both ears.STUDIESStudies personally reviewed by myself.  12/25/2023  12:00 PM  Conventional Audiogram R 250 Hz 15 R 500 Hz 10 R 1000 Hz 10 R 2000 Hz 30* R 4000 Hz 65* R 8000 Hz 55* R Speech 92 % L 250 Hz 15 L 500 Hz 15 L 1000 Hz 10 L 2000 Hz 10 L 4000 Hz 35 L 8000 Hz 45 L Speech 100 %  MRI reviewed from 04/06/24:ASSESSMENT AND PLAN1. High frequency hearing loss, bilateral    2. Tinnitus, bilateral    3. Asymmetrical hearing loss    Lance Peters is a 64 y.o. male who presented for ear itching, tinnitus and hearing asymmetryMRI of the IAC ordered due to asymmetry.Hearing aid on the right might be helpful.Prescribed dermotic  for ear itchingDiscussed masking for tinnitus which helps.He will follow up as needed. Answers submitted by the patient for this visit:FOLLOW UP VISIT on 04/15/2024  8:30 AM with Morene Quay, MDTinnitus (ringing of ear) (Submitted on 04/08/2024)Chief Complaint: TinnitusWhat is your goal for today's visit?: follow up mri

## 2024-06-21 ENCOUNTER — Ambulatory Visit: Admitting: Primary Care

## 2024-12-28 ENCOUNTER — Encounter: Admitting: Primary Care
